# Patient Record
Sex: Female | Born: 1945 | Race: Black or African American | Hispanic: No | Marital: Single | Smoking: Former smoker
Health system: Southern US, Community
[De-identification: ages and names within clinical notes are randomized; demographics above are authoritative.]

## PROBLEM LIST (undated history)

## (undated) DIAGNOSIS — E559 Vitamin D deficiency, unspecified: Secondary | ICD-10-CM

## (undated) DIAGNOSIS — I10 Essential (primary) hypertension: Secondary | ICD-10-CM

## (undated) DIAGNOSIS — F329 Major depressive disorder, single episode, unspecified: Secondary | ICD-10-CM

## (undated) DIAGNOSIS — J449 Chronic obstructive pulmonary disease, unspecified: Secondary | ICD-10-CM

## (undated) DIAGNOSIS — R011 Cardiac murmur, unspecified: Secondary | ICD-10-CM

## (undated) DIAGNOSIS — R7303 Prediabetes: Secondary | ICD-10-CM

## (undated) DIAGNOSIS — R0602 Shortness of breath: Secondary | ICD-10-CM

## (undated) DIAGNOSIS — N182 Chronic kidney disease, stage 2 (mild): Secondary | ICD-10-CM

## (undated) DIAGNOSIS — T7840XA Allergy, unspecified, initial encounter: Secondary | ICD-10-CM

## (undated) DIAGNOSIS — E785 Hyperlipidemia, unspecified: Secondary | ICD-10-CM

## (undated) DIAGNOSIS — R42 Dizziness and giddiness: Secondary | ICD-10-CM

## (undated) HISTORY — DX: Hyperlipidemia, unspecified: E78.5

## (undated) HISTORY — PX: EYE SURGERY: SHX253

## (undated) HISTORY — DX: Dizziness and giddiness: R42

## (undated) HISTORY — DX: Cardiac murmur, unspecified: R01.1

## (undated) HISTORY — DX: Essential (primary) hypertension: I10

## (undated) HISTORY — DX: Major depressive disorder, single episode, unspecified: F32.9

## (undated) HISTORY — PX: TUBAL LIGATION: SHX77

## (undated) HISTORY — DX: Vitamin D deficiency, unspecified: E55.9

## (undated) HISTORY — DX: Chronic kidney disease, stage 2 (mild): N18.2

## (undated) HISTORY — PX: TONSILLECTOMY: SUR1361

## (undated) HISTORY — PX: FOOT SURGERY: SHX648

## (undated) HISTORY — DX: Allergy, unspecified, initial encounter: T78.40XA

## (undated) HISTORY — PX: COLONOSCOPY: SHX174

## (undated) HISTORY — DX: Shortness of breath: R06.02

---

## 1998-12-13 ENCOUNTER — Other Ambulatory Visit: Admission: RE | Admit: 1998-12-13 | Discharge: 1998-12-13 | Payer: Self-pay | Admitting: Family Medicine

## 2000-04-09 ENCOUNTER — Other Ambulatory Visit: Admission: RE | Admit: 2000-04-09 | Discharge: 2000-04-09 | Payer: Self-pay | Admitting: Family Medicine

## 2000-04-09 ENCOUNTER — Encounter: Admission: RE | Admit: 2000-04-09 | Discharge: 2000-04-09 | Payer: Self-pay | Admitting: Family Medicine

## 2000-04-09 ENCOUNTER — Encounter: Payer: Self-pay | Admitting: Family Medicine

## 2001-04-11 ENCOUNTER — Other Ambulatory Visit: Admission: RE | Admit: 2001-04-11 | Discharge: 2001-04-11 | Payer: Self-pay | Admitting: Family Medicine

## 2001-09-19 ENCOUNTER — Ambulatory Visit (HOSPITAL_COMMUNITY): Admission: RE | Admit: 2001-09-19 | Discharge: 2001-09-19 | Payer: Self-pay | Admitting: Gastroenterology

## 2001-09-19 ENCOUNTER — Encounter (INDEPENDENT_AMBULATORY_CARE_PROVIDER_SITE_OTHER): Payer: Self-pay | Admitting: *Deleted

## 2002-07-23 ENCOUNTER — Other Ambulatory Visit: Admission: RE | Admit: 2002-07-23 | Discharge: 2002-07-23 | Payer: Self-pay | Admitting: Family Medicine

## 2002-07-29 ENCOUNTER — Encounter: Admission: RE | Admit: 2002-07-29 | Discharge: 2002-07-29 | Payer: Self-pay | Admitting: Family Medicine

## 2002-07-29 ENCOUNTER — Encounter: Payer: Self-pay | Admitting: Family Medicine

## 2003-10-08 ENCOUNTER — Encounter: Admission: RE | Admit: 2003-10-08 | Discharge: 2003-10-08 | Payer: Self-pay | Admitting: Family Medicine

## 2004-11-29 ENCOUNTER — Other Ambulatory Visit: Admission: RE | Admit: 2004-11-29 | Discharge: 2004-11-29 | Payer: Self-pay | Admitting: Family Medicine

## 2004-12-27 ENCOUNTER — Other Ambulatory Visit: Admission: RE | Admit: 2004-12-27 | Discharge: 2004-12-27 | Payer: Self-pay | Admitting: Family Medicine

## 2005-01-10 ENCOUNTER — Encounter: Admission: RE | Admit: 2005-01-10 | Discharge: 2005-01-10 | Payer: Self-pay | Admitting: Family Medicine

## 2005-03-14 ENCOUNTER — Encounter: Admission: RE | Admit: 2005-03-14 | Discharge: 2005-03-14 | Payer: Self-pay | Admitting: *Deleted

## 2005-03-17 ENCOUNTER — Ambulatory Visit (HOSPITAL_COMMUNITY): Admission: RE | Admit: 2005-03-17 | Discharge: 2005-03-17 | Payer: Self-pay | Admitting: *Deleted

## 2005-03-17 ENCOUNTER — Encounter (INDEPENDENT_AMBULATORY_CARE_PROVIDER_SITE_OTHER): Payer: Self-pay | Admitting: Specialist

## 2005-03-17 ENCOUNTER — Ambulatory Visit (HOSPITAL_BASED_OUTPATIENT_CLINIC_OR_DEPARTMENT_OTHER): Admission: RE | Admit: 2005-03-17 | Discharge: 2005-03-17 | Payer: Self-pay | Admitting: *Deleted

## 2006-03-09 ENCOUNTER — Encounter: Admission: RE | Admit: 2006-03-09 | Discharge: 2006-03-09 | Payer: Self-pay | Admitting: Family Medicine

## 2006-05-01 ENCOUNTER — Ambulatory Visit (HOSPITAL_COMMUNITY): Admission: RE | Admit: 2006-05-01 | Discharge: 2006-05-01 | Payer: Self-pay | Admitting: Cardiovascular Disease

## 2006-07-14 IMAGING — CR DG KNEE 3 VIEWS*R*
3 series · 3 of 3 positions shown · non-contrast
Comparison: None.

RIGHT KNEE - 3 VIEW:

CLINICAL DATA: Knee pain

[t knee oblique right]
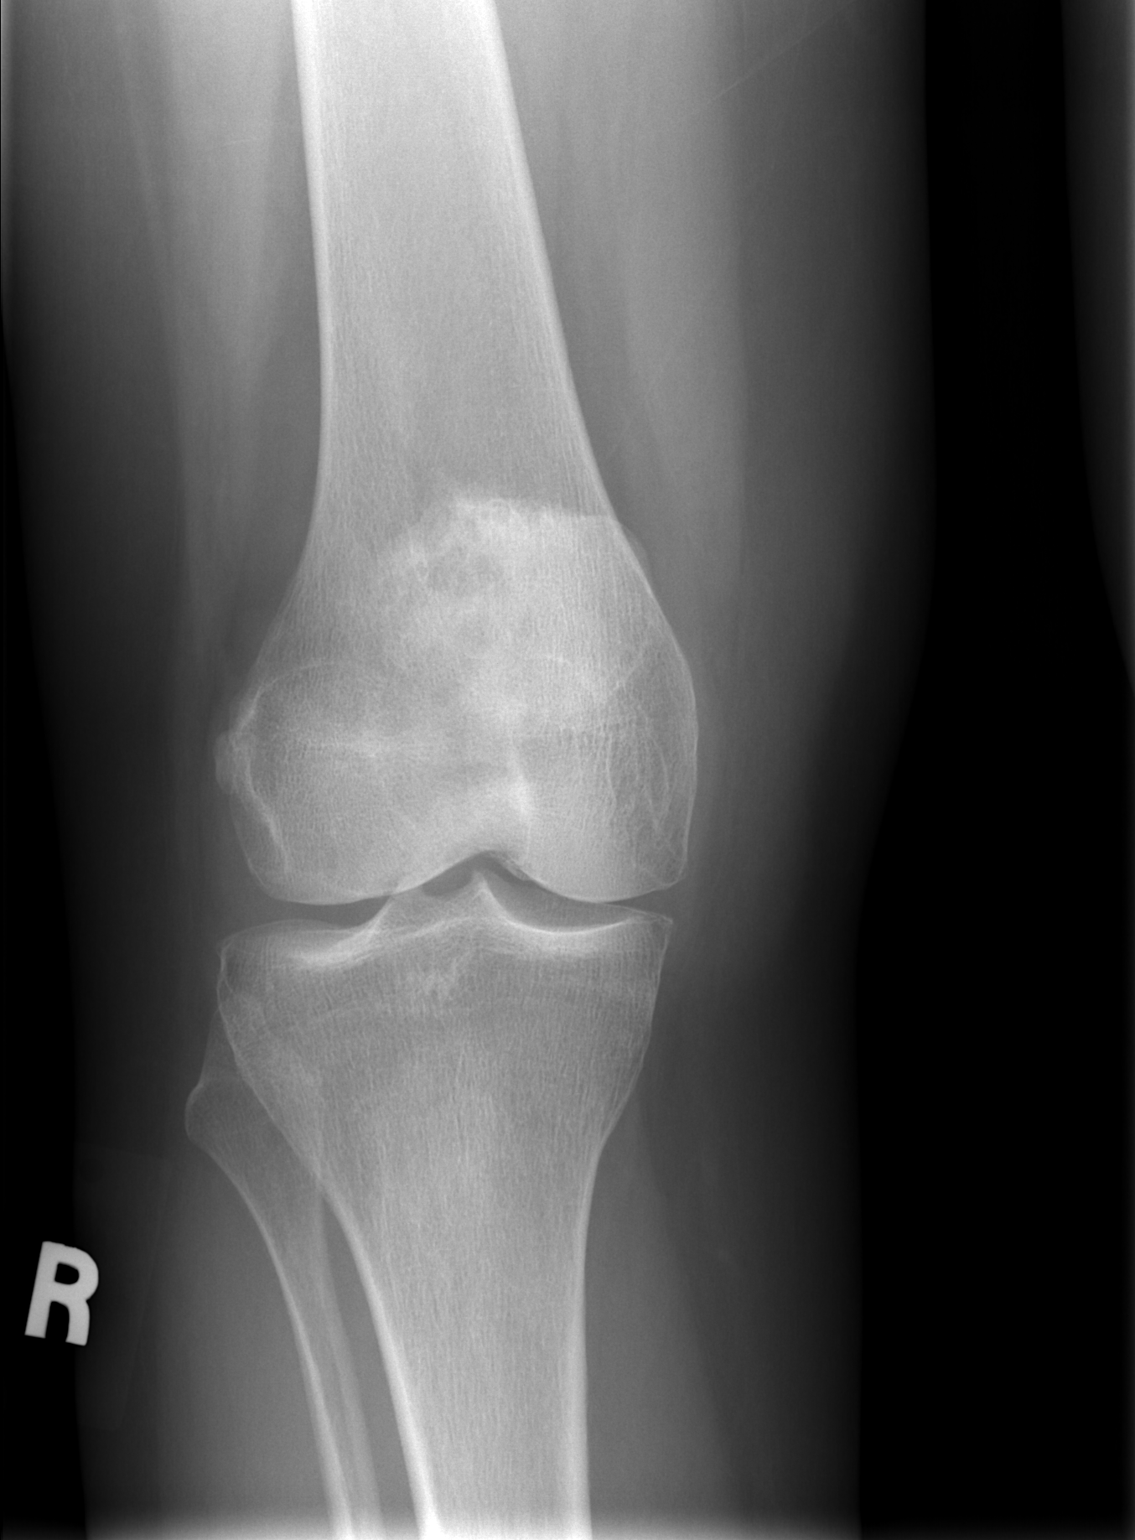

[t knee lat right]
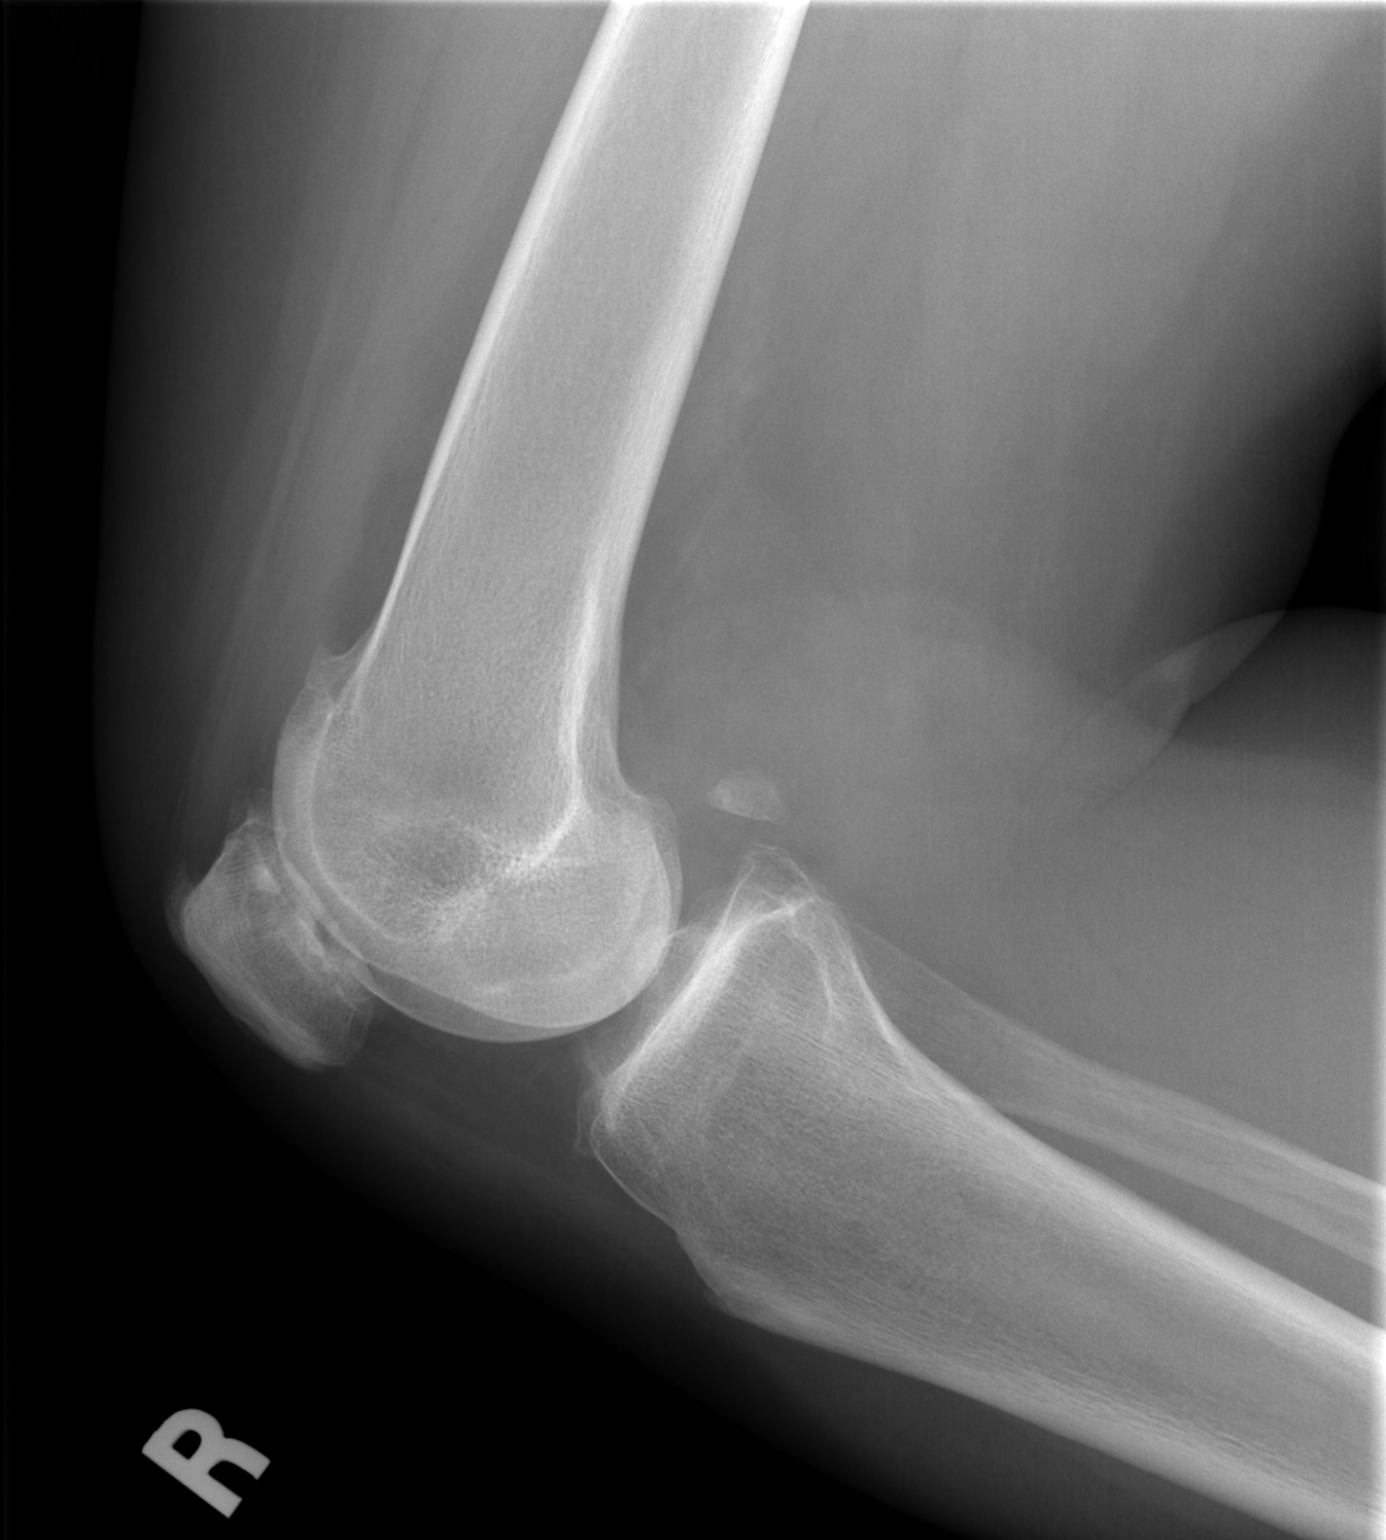

[view not recorded]
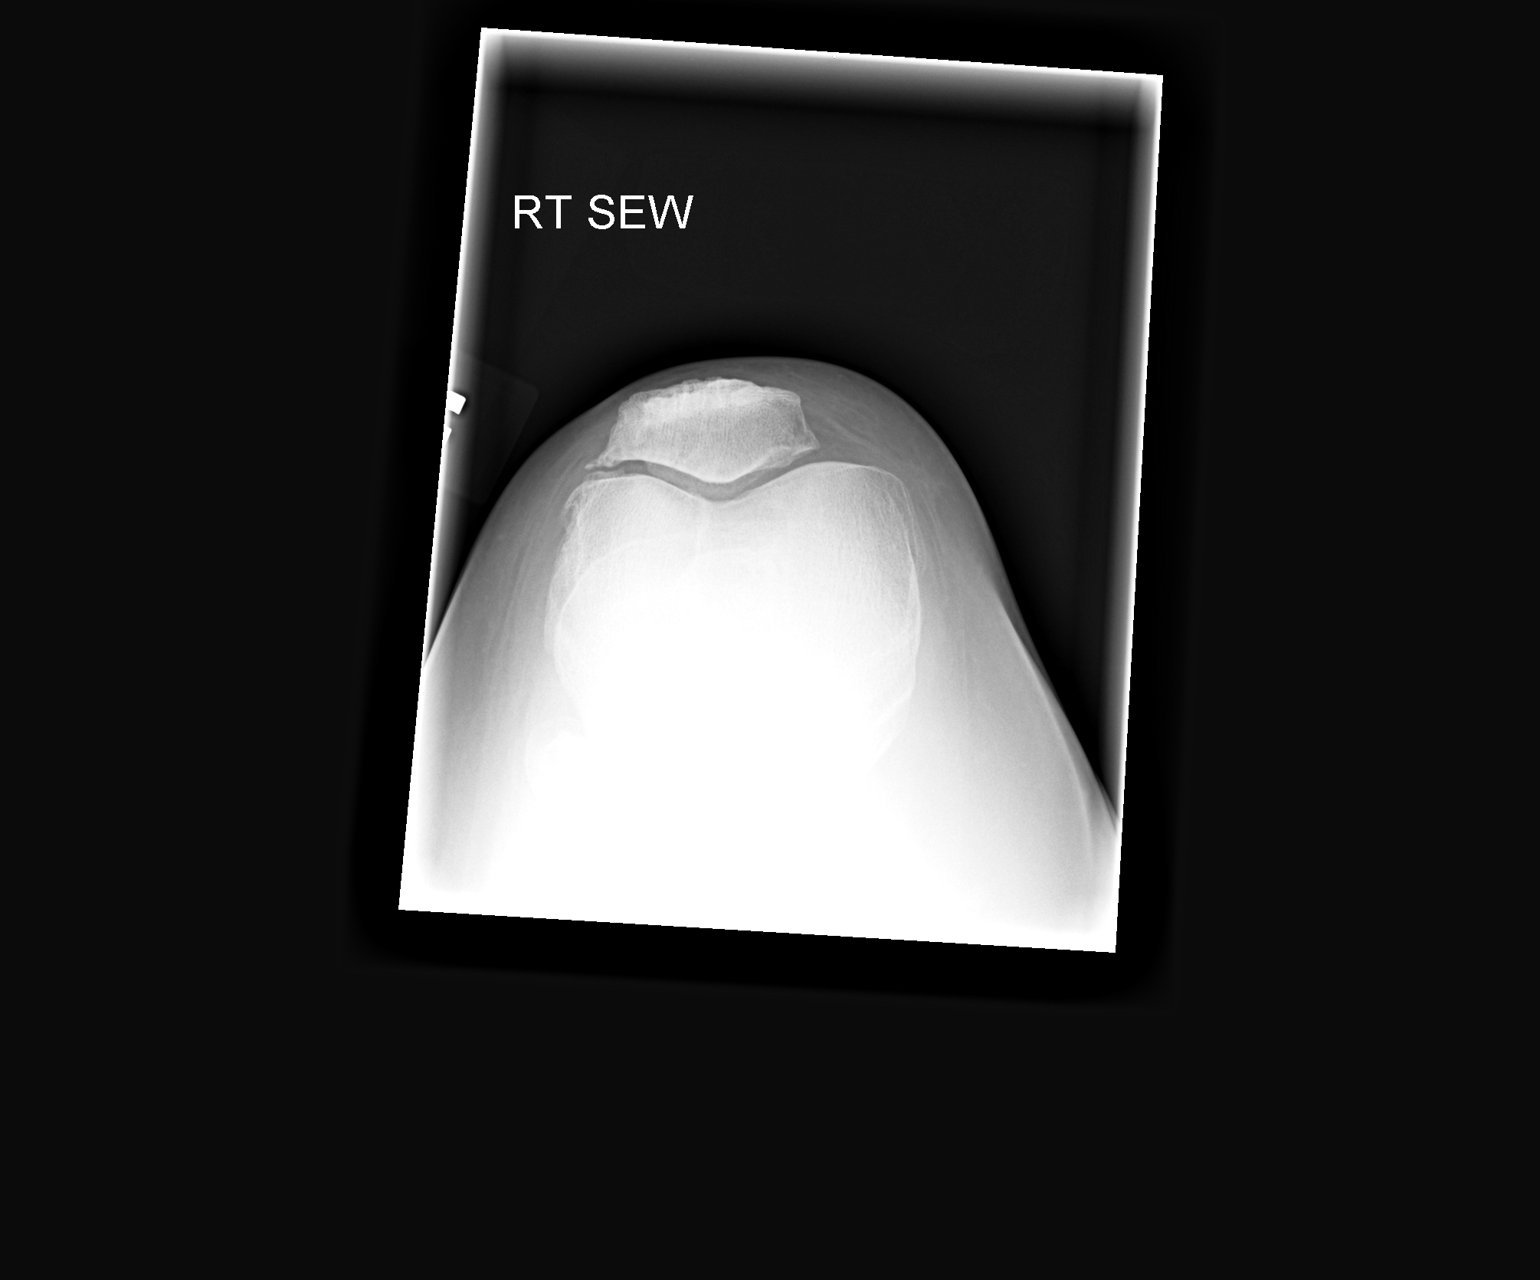

[3 of 3 positions shown; findings below may reference images not displayed]

FINDINGS: No evidence for acute fracture or dislocation. No joint effusion.
Tricompartmental spurring is apparent with the patellofemoral compartment being
most affected. Subchondral sclerosis and subchondral cyst formation noted along
the articular surface of the patella.
IMPRESSION: No acute bony abnormality. Degenerative changes are noted and are most prominent
in the patellofemoral compartment.

## 2006-11-16 ENCOUNTER — Ambulatory Visit (HOSPITAL_COMMUNITY): Admission: RE | Admit: 2006-11-16 | Discharge: 2006-11-16 | Payer: Self-pay | Admitting: Gastroenterology

## 2007-08-21 ENCOUNTER — Encounter: Admission: RE | Admit: 2007-08-21 | Discharge: 2007-08-21 | Payer: Self-pay | Admitting: Family Medicine

## 2007-08-23 ENCOUNTER — Encounter: Admission: RE | Admit: 2007-08-23 | Discharge: 2007-08-23 | Payer: Self-pay | Admitting: Family Medicine

## 2009-08-05 ENCOUNTER — Encounter: Admission: RE | Admit: 2009-08-05 | Discharge: 2009-08-05 | Payer: Self-pay | Admitting: Family Medicine

## 2010-07-06 ENCOUNTER — Encounter: Admission: RE | Admit: 2010-07-06 | Discharge: 2010-07-06 | Payer: Self-pay | Admitting: Family Medicine

## 2011-01-20 NOTE — Op Note (Signed)
NAMECLARISA, Katherine Summers                ACCOUNT NO.:  [id number]   MEDICAL RECORD NO.:  [id number]          PATIENT TYPE:  AMB   LOCATION:  ENDO                         FACILITY:  [facility]   PHYSICIAN:  Anselmo Rod, M.D.  DATE OF BIRTH:  May 05, 1946   DATE OF PROCEDURE:  11/16/2006  DATE OF DISCHARGE:                               OPERATIVE REPORT   PROCEDURE PERFORMED:  Screening colonoscopy.   ENDOSCOPIST:  Anselmo Rod, M.D.   INSTRUMENT USED:  The Pentax video colonoscope.   INDICATIONS:  A 65 year old [place] American female with a family  history of colon cancer in paternal grandmother and her father,  undergoing screening colonoscopy.  This is her 5-year surveillance  colonoscopy.  Rule out colonic polyps, masses, etc.   PREPROCEDURE PREPARATION:  Informed consent was procured from the  patient. The patient fasted for 4 hours prior to the procedure and  prepped with 20 of OsmoPrep the night of and 12  of OsmoPrep in the  morning of the procedure.  Risks and benefits of the procedure including  a 10% misread of cancer and polyp were discussed with the patient as  well.   PREPROCEDURE PHYSICAL EXAMINATION:  VITAL SIGNS:  Stable.  NECK:  Supple.  CHEST:  Clear to auscultation.  HEART:  S1 and S2 regular.  ABDOMEN:  Soft with normal bowel sounds.   DESCRIPTION OF PROCEDURE:  The patient was placed in the left lateral  decubitus position and sedated with 95 mcg of Fentanyl and 9.5 g of  Versed given intravenously in slow incremental doses.  Once the patient  was adequately sedated and maintained on low-flow oxygen and continuous  cardiac monitoring, the Pentax video colonoscope was advanced from the  rectum to the cecum except for a few sigmoid diverticula.  No other  abnormalities were noted.  The appendiceal orifice and cecal valve were  clearly visualized and photographed.  The terminal ileum appeared  healthy without lesions.  No masses or polyps were noted.   Retroflexion  in the rectum revealed no abnormalities.   IMPRESSION:  1. Normal colonoscopy to terminal ileum except first a few sigmoid      diverticula.  2. No masses or polyps seen.   RECOMMENDATIONS:  1. Continue high fiber diet with liberal fluid intake.  Brochures on      diverticulosis were given to the patient for      education.  2. Repeat colonoscopy in the next 5 years.  If the patient has any      abnormal symptoms in the interim, she is to contact the office      immediately for further recommendations.      Anselmo Rod, M.D.  Electronically Signed     JNM/MEDQ  D:  11/16/2006  T:  11/17/2006  Job:  [id number]   cc:   Talmadge Coventry, M.D.

## 2011-01-20 NOTE — Procedures (Signed)
. [facility]  Patient:    Katherine Summers, Katherine Summers Visit Number: [id number] MRN: [id number]          Service Type: END Location: ENDO Attending Physician:  Charna Elizabeth Dictated by:   Anselmo Rod, M.D. Proc. Date: 09/19/01 Admit Date:  09/19/2001 Discharge Date: 09/19/2001   CC:         Talmadge Coventry, M.D.                           Procedure Report  DATE OF BIRTH:  10/23/1945.  PROCEDURE:  Colonoscopy with snare polypectomy x 1.  ENDOSCOPIST:  Anselmo Rod, M.D.  INSTRUMENT USED:  Olympus video colonoscope.  INDICATION FOR PROCEDURE:  Family history of colon cancer in a 65 year old African-American female.  Rule out colonic polyps.  PREPROCEDURE PREPARATION:  Informed consent was procured from the patient. The patient was fasted for eight hours prior to the procedure and prepped with a bottle of magnesium citrate and a gallon of NuLytely the night prior to the procedure.  PREPROCEDURE PHYSICAL:  VITAL SIGNS:  The patient had stable vital signs.  NECK:  Supple.  CHEST:  Clear to auscultation.  S1, S2 regular.  ABDOMEN:  Soft with normal bowel sounds.  DESCRIPTION OF PROCEDURE:  The patient was placed in the left lateral decubitus position and sedated with 50 mg of Demerol and 5 mg of Versed. intravenously.  Once the patient was adequately sedate and maintained on low-flow oxygen and continuous cardiac monitoring, the Olympus video colonoscope was advanced from the rectum to the cecum without difficulty.  The patient had a small sessile polyp that was snared from 30 cm.  There was also evidence of early left-sided diverticulosis.  On retroflexion, a small internal hemorrhoid was appreciated.  The procedure was complete up to the cecum.  The ileocecal valve and appendiceal orifice were clearly visualized and photographed.  IMPRESSION: 1. Small, nonbleeding internal hemorrhoid. 2. Early left-sided diverticulosis. 3. Small  sessile polyp snared from 30 cm.  RECOMMENDATIONS: 1. Await pathology results. 2. Repeat colorectal cancer screening depending on the pathology results. 3. Avoid all nonsteroidals including aspirin for the next three weeks. 4. A high-fiber diet. 5. Outpatient follow-up in the next four weeks. Dictated by:   Anselmo Rod, M.D. Attending Physician:  Charna Elizabeth DD:  09/19/01 TD:  09/19/01 Job: [id number]

## 2011-01-20 NOTE — Op Note (Signed)
NAMEGENAE, STRINE                ACCOUNT NO.:  [id number]   MEDICAL RECORD NO.:  [id number]          PATIENT TYPE:  AMB   LOCATION:  NESC                         FACILITY:  [facility]   PHYSICIAN:  Pershing Cox, M.D.DATE OF BIRTH:  07/12/1946   DATE OF PROCEDURE:  03/17/2005  DATE OF DISCHARGE:                                 OPERATIVE REPORT   PREOPERATIVE DIAGNOSES:  Endometrial cells on Pap smear and abnormal  endometrium on sonogram.   POSTOPERATIVE DIAGNOSES:  Normal endometrial cavity. No filling defects,  history of endometrial cells on Pap smear.   PROCEDURE:  D&C hysteroscopy.   SURGEON:  Pershing Cox, M.D.   ASSISTANT:  None.   ANESTHESIA:  MAC plus Marcaine paracervical block.   INDICATIONS FOR PROCEDURE:  Anakaren Campion is 65 years old. She presented to  my office with a history of workup for right lower quadrant pain and a  history of Pap smear with endometrial cells on March 2006. The patient had a  sonogram in May which showed endometrial fluid and an irregular endometrial  lining. The ovaries could not be visualized on the scan and there was fluid  in the cul-de-sac. In preparation for this surgery, we repeated her  sonogram. Fluid in the endometrial canal persists. There is no cul-de-sac  fluid and the ovaries were visualized and very small and normal in size and  shape. She is brought to the operating room today to evaluate her  endometrial lining.   FINDINGS:  Exam under anesthesia shows a normal uterus, no evidence of  adnexal enlargement. The endometrial canal sounded to a depth of 8 cm. There  was copious cervical mucous. There are no filling defects in the endometrial  lining. There was a moderate amount of tissue obtained on curettage.   DESCRIPTION OF PROCEDURE:  The patient was brought to the operating room  with an IV in place. She received a gram of Ancef in the holding area. She  was taken to the operating room and supine on the OR table  IV sedation was  administered. When she was completely sedated, she was placed into Allen  stirrups. Exam under anesthesia was performed. The patient then was prepped  with Betadine and her bladder was emptied with a sterile catheter. Sterile  linens were applied.   A Sims retractor was used to visualize the cervix and a narrow Deaver lifted  the bladder. Marcaine was instilled into the anterior cervix which was  grasped with a single-tooth tenaculum and Marcaine paracervical block was  administered at the 3, 4, 7, and 8 positions using a total volume of 15 mL.  Telfa was placed beneath the cervix and Kevorkian curette was used to obtain  endocervical curetting's. The sound then passed to a depth of 8 cm. Serial  Pratt dilators were used to dilate the cervix to size 25 and the diagnostic  hysteroscope was introduced into the endometrial canal. Using through-and-  through sorbitol irrigation, the cavity was visualized and photographs were  taken. The scope was removed and a clean Telfa was placed beneath the  cervix.  Using a small sharp curette, the walls of the endometrium was  serially curetted in all quadrants and then diffusely curetted with this  small sharp curette. The Meigs curette was then used to finish the curettage  but no additional tissue was obtained. I looked again with the hysteroscope  and there was no tissue remaining to be noted. Photographs documented this  curettage.   The patient tolerated the procedure well and was taken to the recovery room  in good condition. She will be discharged home from this procedure.       MAJ/MEDQ  D:  03/17/2005  T:  03/17/2005  Job:  [id number]   cc:   Talmadge Coventry, M.D.  [address]  [place]  [place] [address]  Fax: [phone]

## 2014-07-01 ENCOUNTER — Ambulatory Visit (INDEPENDENT_AMBULATORY_CARE_PROVIDER_SITE_OTHER): Payer: 59 | Admitting: Psychology

## 2014-07-01 DIAGNOSIS — F332 Major depressive disorder, recurrent severe without psychotic features: Secondary | ICD-10-CM

## 2014-07-17 ENCOUNTER — Ambulatory Visit: Payer: 59 | Admitting: Psychology

## 2014-08-21 ENCOUNTER — Ambulatory Visit (INDEPENDENT_AMBULATORY_CARE_PROVIDER_SITE_OTHER): Payer: 59 | Admitting: Psychology

## 2014-08-21 DIAGNOSIS — F332 Major depressive disorder, recurrent severe without psychotic features: Secondary | ICD-10-CM

## 2014-09-18 ENCOUNTER — Ambulatory Visit: Payer: 59 | Admitting: Psychology

## 2014-12-02 ENCOUNTER — Encounter: Payer: Self-pay | Admitting: Podiatry

## 2014-12-02 ENCOUNTER — Ambulatory Visit (INDEPENDENT_AMBULATORY_CARE_PROVIDER_SITE_OTHER): Payer: Medicare Other | Admitting: Podiatry

## 2014-12-02 ENCOUNTER — Ambulatory Visit (INDEPENDENT_AMBULATORY_CARE_PROVIDER_SITE_OTHER): Payer: Medicare Other

## 2014-12-02 VITALS — BP 121/73 | HR 80 | Resp 16 | Ht 65.5 in | Wt 170.0 lb

## 2014-12-02 DIAGNOSIS — M2010 Hallux valgus (acquired), unspecified foot: Secondary | ICD-10-CM | POA: Diagnosis not present

## 2014-12-02 DIAGNOSIS — M2040 Other hammer toe(s) (acquired), unspecified foot: Secondary | ICD-10-CM

## 2014-12-02 DIAGNOSIS — B351 Tinea unguium: Secondary | ICD-10-CM | POA: Diagnosis not present

## 2014-12-02 DIAGNOSIS — M79673 Pain in unspecified foot: Secondary | ICD-10-CM

## 2014-12-02 NOTE — Progress Notes (Signed)
Subjective:     Patient ID: Katherine Summers, female   DOB: [birth year], 69 y.o.   MRN: [id number]  HPI patient has structural deformity of both feet that she was concerned about and severe thickness of her nailbeds 1-5 both feet that she cannot cut herself and she has tried to take care of but do to long-term diabetes is concerned about doing this   Review of Systems  All other systems reviewed and are negative.      Objective:   Physical Exam  Constitutional: She is oriented to person, place, and time.  Cardiovascular: Intact distal pulses.   Musculoskeletal: Normal range of motion.  Neurological: She is oriented to person, place, and time.  Skin: Skin is warm.  Nursing note and vitals reviewed.  neurovascular status intact with muscle strength adequate and range of motion moderately diminished subtalar midtarsal joint. Severe foot deformity of both feet with previous correction that did well but there is some reduction of motion of the digital function secondary to the procedures. 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 At this time I recommended nail debridement which was accomplished with no iatrogenic bleeding and this will be done routinely. Digits are functioning and I do not recommend any further surgery for this or structural bunion deformity which I discussed with patient

## 2014-12-02 NOTE — Progress Notes (Signed)
   Subjective:    Patient ID: Katherine Summers, female    DOB: [birth year], 69 y.o.   MRN: [id number]  HPI i am pre diabetic so i need to have my toenails trimmed, also have a lot of problems with both my feet since he did my  Surgery and my toes dont open, my legs lock up and my knees are bad, and its all from the surgery   Review of Systems  Endocrine:       Diabetes   All other systems reviewed and are negative.      Objective:   Physical Exam        Assessment & Plan:

## 2015-03-22 ENCOUNTER — Ambulatory Visit: Payer: Medicare Other

## 2016-07-20 ENCOUNTER — Other Ambulatory Visit: Payer: Self-pay

## 2016-07-20 NOTE — Telephone Encounter (Signed)
FAXED NOTES TO [facility] 07/20/16 RJ

## 2016-07-26 ENCOUNTER — Telehealth: Payer: Self-pay | Admitting: Cardiovascular Disease

## 2016-07-26 NOTE — Telephone Encounter (Signed)
Received records from [facility] for appointment on 08/01/16 with Dr [name].  [facility] for Dr Leonides Sakeandolph's schedule on 08/01/16. lp

## 2016-08-01 ENCOUNTER — Encounter: Payer: Self-pay | Admitting: Cardiovascular Disease

## 2016-08-01 ENCOUNTER — Ambulatory Visit (INDEPENDENT_AMBULATORY_CARE_PROVIDER_SITE_OTHER): Payer: Medicare Other | Admitting: Cardiovascular Disease

## 2016-08-01 VITALS — BP 130/78 | HR 98 | Ht 65.0 in | Wt 183.6 lb

## 2016-08-01 DIAGNOSIS — R9431 Abnormal electrocardiogram [ECG] [EKG]: Secondary | ICD-10-CM | POA: Diagnosis not present

## 2016-08-01 DIAGNOSIS — F329 Major depressive disorder, single episode, unspecified: Secondary | ICD-10-CM | POA: Insufficient documentation

## 2016-08-01 DIAGNOSIS — R0602 Shortness of breath: Secondary | ICD-10-CM | POA: Diagnosis not present

## 2016-08-01 DIAGNOSIS — I10 Essential (primary) hypertension: Secondary | ICD-10-CM | POA: Diagnosis not present

## 2016-08-01 DIAGNOSIS — F32A Depression, unspecified: Secondary | ICD-10-CM | POA: Insufficient documentation

## 2016-08-01 DIAGNOSIS — R011 Cardiac murmur, unspecified: Secondary | ICD-10-CM | POA: Diagnosis not present

## 2016-08-01 DIAGNOSIS — F3289 Other specified depressive episodes: Secondary | ICD-10-CM

## 2016-08-01 HISTORY — DX: Essential (primary) hypertension: I10

## 2016-08-01 HISTORY — DX: Depression, unspecified: F32.A

## 2016-08-01 HISTORY — DX: Cardiac murmur, unspecified: R01.1

## 2016-08-01 HISTORY — DX: Shortness of breath: R06.02

## 2016-08-01 NOTE — Patient Instructions (Addendum)
Medication Instructions:  Your physician recommends that you continue on your current medications as directed. Please refer to the Current Medication list given to you today.  Labwork: NONE  Testing/Procedures: Your physician has requested that you have an echocardiogram. Echocardiography is a painless test that uses sound waves to create images of your heart. It provides your doctor with information about the size and shape of your heart and how well your heart's chambers and valves are working. This procedure takes approximately one hour. There are no restrictions for this procedure. [facility] [address]  Your physician has requested that you have a lexiscan myoview. For further information please visit [email or web address]. Please follow instruction sheet, as given.  Follow-Up: Your physician recommends that you schedule a follow-up appointment in: 1 MONTH OV ON 08/29/16  If you need a refill on your cardiac medications before your next appointment, please call your pharmacy.

## 2016-08-01 NOTE — Progress Notes (Signed)
Cardiology Office Note   Date:  08/01/2016   ID:  Katherine Summers, [place] MeoDOB 08-21-1946, MRN [id number]  PCP:  Gwynneth AlimentSANDERS,ROBYN N, MD  Cardiologist:   Chilton Siiffany [place], MD   Chief Complaint  Patient presents with  . New Patient (Initial Visit)     History of Present Illness: Katherine Summers is a 70 y.o. female with diabetes, hyperlipidemia, and hypertension who presents for evaluation of an abnormal EKG.  Katherine Summers saw Dr. Dorothyann Pengobyn Sanders on 07/12/16 and was noted to have an abnormal EKG. She had anterolateral ST depressions with T-wave inversions. She was asymptomatic at the time but was referred to cardiology for further evaluation.  She notes that she has been feeling well physically but has been feeling sad and depressed.  She feels stressed by her family.  She has adult children but doesn't see them regularly.  She has mostly been laying around her house.  She attempts to exercise by doing a lap around her house.  However, she does note shortness of breath with minimal exertion.  She denies chest pain, nausea or diaphoresis.  She occasionally has lower extremity edema but it improves with elevation of her legs.  She denies orthopnea or PND.    Katherine Summers due to both shortness of breath as well as because of bilateral leg pain. She was told that she needs bilateral hips and knees replaced.  However, she doesn't want to have surgery. She reports constant leg pain and they sometimes give out on her. She takes Spiriva most days but doesn't think that it helps her breathing.  She quit smoking 15-20 years ago.   Past Medical History:  Diagnosis Date  . Allergy   . Depression 08/01/2016  . Diabetes mellitus without complication (HCC)   . Essential hypertension 08/01/2016  . Hypertension   . Murmur 08/01/2016  . Shortness of breath 08/01/2016    Past Surgical History:  Procedure Laterality Date  . FOOT SURGERY       Current Outpatient Prescriptions  Medication Sig  Dispense Refill  . amLODipine (NORVASC) 5 MG tablet Take 5 mg by mouth daily.  5  . aspirin 81 MG tablet Take 81 mg by mouth daily.    Marland Kitchen. atorvastatin (LIPITOR) 20 MG tablet   3  . Cholecalciferol (D3-1000 PO) Take 2 capsules by mouth daily.    Marland Kitchen. escitalopram (LEXAPRO) 5 MG tablet Take 5 mg by mouth daily.  5  . lisinopril-hydrochlorothiazide (PRINZIDE,ZESTORETIC) 20-12.5 MG per tablet Take 1 tablet by mouth daily.  5  . loratadine (CLARITIN) 10 MG tablet Take 1 tablet by mouth daily.  0  . Tiotropium Bromide Monohydrate (SPIRIVA RESPIMAT) 1.25 MCG/ACT AERS Inhale 1 puff into the lungs daily.     No current facility-administered medications for this visit.     Allergies:   Patient has no known allergies.    Social History:  The patient  reports that she has quit smoking. She does not have any smokeless tobacco history on file.   Family History:  The patient's family history includes Colon cancer in her father, paternal grandfather, and sister; Stroke in her maternal grandmother and mother.    ROS:  Please see the history of present illness.   Otherwise, review of systems are positive for none.   All other systems are reviewed and negative.    PHYSICAL EXAM: VS:  BP 130/78 (BP Location: Right Arm)   Pulse 98   Ht 5\' 5"  (  1.651 m)   Wt 83.3 kg (183 lb 9.6 oz)   BMI 30.55 kg/m  , BMI Body mass index is 30.55 kg/m. GENERAL:  Well appearing HEENT:  Pupils equal round and reactive, fundi not visualized, oral mucosa unremarkable NECK:  No jugular venous distention, waveform within normal limits, carotid upstroke brisk and symmetric, no bruits, no thyromegaly LYMPHATICS:  No cervical adenopathy LUNGS:  Clear to auscultation bilaterally HEART:  RRR.  PMI not displaced or sustained,S1 and S2 within normal limits, no S3, no S4, no clicks, no rubs, II/VI systolic murmur at the LUSB ABD:  Flat, positive bowel sounds normal in frequency in pitch, no bruits, no rebound, no guarding, no midline  pulsatile mass, no hepatomegaly, no splenomegaly EXT:  2 plus pulses throughout, no edema, no cyanosis no clubbing SKIN:  No rashes no nodules NEURO:  Cranial nerves II through XII grossly intact, motor grossly intact throughout PSYCH:  Cognitively intact, oriented to person place and time    EKG:  EKG is ordered today. The ekg ordered today demonstrates sinus rhythm rate 98 bpm.  Inferolateral ST depression. 07/12/16: Sinus rhythm. Rate 86 bpm. Anterolateral T-wave inversions with 1 mm ST depression.  Recent Labs: No results found for requested labs within last 8760 hours.   Thousand 17: Sodium 143, potassium 3.9, BUN 13, creatinine 0.82 AST 36, ALT 42  Lipid Panel No results found for: CHOL, TRIG, HDL, CHOLHDL, VLDL, LDLCALC, LDLDIRECT    Wt Readings from Last 3 Encounters:  08/01/16 83.3 kg (183 lb 9.6 oz)  12/02/14 77.1 kg (170 lb)      ASSESSMENT AND PLAN:  # Abnormal EKG: # Shortness of breath: # Murmur:  Katherine Summers EKG is concerning for ischemia. She has exertional dyspnea but no chest pain.  However, she isn't very physically active and reports that she mostly lays in the bed all day.  She doesn't think that she would be able to walk on a treadmill.  We will get a Lexiscan Myoview to evaluate for ischemia.  She also has a murmur on exam that could be mild aortic stenosis.  We will get an echo to assess.  # Hypertension: BP well-controlled.  Continue amlodipine, lisinopril and HCTZ.   Current medicines are reviewed at length with the patient today.  The patient does not have concerns regarding medicines.  The following changes have been made:  no change  Labs/ tests ordered today include:   Orders Placed This Encounter  Procedures  . Myocardial Perfusion Imaging  . EKG 12-Lead  . ECHOCARDIOGRAM COMPLETE     Disposition:   FU with [name], MD, [facility] in 1 month    This note was written with the assistance of speech recognition software.  Please  excuse any transcriptional errors.  Signed, Anaiya Wisinski C. [name], MD, [facility]ACC  08/01/2016 4:21 PM    [facility]

## 2016-08-09 ENCOUNTER — Telehealth (HOSPITAL_COMMUNITY): Payer: Self-pay

## 2016-08-09 NOTE — Telephone Encounter (Signed)
Encounter complete. 

## 2016-08-11 ENCOUNTER — Ambulatory Visit (HOSPITAL_COMMUNITY)
Admission: RE | Admit: 2016-08-11 | Discharge: 2016-08-11 | Disposition: A | Discharge status: Home / Self Care | Payer: Medicare Other | Source: Ambulatory Visit | Attending: Cardiology | Admitting: Cardiology

## 2016-08-11 ENCOUNTER — Encounter (HOSPITAL_COMMUNITY): Payer: Self-pay

## 2016-08-11 DIAGNOSIS — R0602 Shortness of breath: Secondary | ICD-10-CM | POA: Diagnosis not present

## 2016-08-11 DIAGNOSIS — R9431 Abnormal electrocardiogram [ECG] [EKG]: Secondary | ICD-10-CM | POA: Diagnosis not present

## 2016-08-11 LAB — MYOCARDIAL PERFUSION IMAGING
CHL CUP NUCLEAR SSS: 1
CSEPPHR: 100 {beats}/min
LVDIAVOL: 55 mL (ref 46–106)
LVSYSVOL: 17 mL
Rest HR: 65 {beats}/min
SDS: 1
SRS: 0
TID: 1.01

## 2016-08-11 MED ORDER — AMINOPHYLLINE 25 MG/ML IV SOLN
75.0000 mg | Freq: Once | INTRAVENOUS | Status: AC
  Administered 2016-08-11: 75 mg via INTRAVENOUS

## 2016-08-11 MED ORDER — TECHNETIUM TC 99M TETROFOSMIN IV KIT
29.7000 | PACK | Freq: Once | INTRAVENOUS | Status: AC | PRN
  Administered 2016-08-11: 29.7 via INTRAVENOUS
  Filled 2016-08-11: qty 30

## 2016-08-11 MED ORDER — TECHNETIUM TC 99M TETROFOSMIN IV KIT
10.4000 | PACK | Freq: Once | INTRAVENOUS | Status: AC | PRN
  Administered 2016-08-11: 10.4 via INTRAVENOUS
  Filled 2016-08-11: qty 11

## 2016-08-11 MED ORDER — REGADENOSON 0.4 MG/5ML IV SOLN
0.4000 mg | Freq: Once | INTRAVENOUS | Status: AC
  Administered 2016-08-11: 0.4 mg via INTRAVENOUS

## 2016-08-18 ENCOUNTER — Telehealth: Payer: Self-pay | Admitting: Cardiovascular Disease

## 2016-08-18 NOTE — Telephone Encounter (Signed)
Patient called with stress test results. Voiced understanding.

## 2016-08-18 NOTE — Telephone Encounter (Signed)
New message  Pt is returning call  Possibly calling about results  Please call back

## 2016-08-23 ENCOUNTER — Ambulatory Visit (HOSPITAL_COMMUNITY): Payer: Medicare Other | Attending: Internal Medicine

## 2016-08-23 ENCOUNTER — Other Ambulatory Visit: Payer: Self-pay

## 2016-08-23 DIAGNOSIS — I119 Hypertensive heart disease without heart failure: Secondary | ICD-10-CM | POA: Diagnosis not present

## 2016-08-23 DIAGNOSIS — E785 Hyperlipidemia, unspecified: Secondary | ICD-10-CM | POA: Diagnosis not present

## 2016-08-23 DIAGNOSIS — I361 Nonrheumatic tricuspid (valve) insufficiency: Secondary | ICD-10-CM | POA: Insufficient documentation

## 2016-08-23 DIAGNOSIS — R0600 Dyspnea, unspecified: Secondary | ICD-10-CM | POA: Diagnosis present

## 2016-08-23 DIAGNOSIS — E119 Type 2 diabetes mellitus without complications: Secondary | ICD-10-CM | POA: Insufficient documentation

## 2016-08-23 DIAGNOSIS — R9431 Abnormal electrocardiogram [ECG] [EKG]: Secondary | ICD-10-CM

## 2016-08-23 DIAGNOSIS — R0602 Shortness of breath: Secondary | ICD-10-CM

## 2016-08-29 ENCOUNTER — Encounter: Payer: Self-pay | Admitting: Cardiovascular Disease

## 2016-08-29 ENCOUNTER — Ambulatory Visit (INDEPENDENT_AMBULATORY_CARE_PROVIDER_SITE_OTHER): Payer: Medicare Other | Admitting: Cardiovascular Disease

## 2016-08-29 VITALS — BP 138/72 | HR 66 | Resp 18 | Ht 65.0 in | Wt 184.6 lb

## 2016-08-29 DIAGNOSIS — R0602 Shortness of breath: Secondary | ICD-10-CM | POA: Diagnosis not present

## 2016-08-29 DIAGNOSIS — I10 Essential (primary) hypertension: Secondary | ICD-10-CM | POA: Diagnosis not present

## 2016-08-29 DIAGNOSIS — E7800 Pure hypercholesterolemia, unspecified: Secondary | ICD-10-CM

## 2016-08-29 NOTE — Progress Notes (Signed)
Cardiology Office Note   Date:  08/29/2016   ID:  Katherine CoriaBeverly M Summers, Katherine MeoDOB Mar 29, 1946, MRN [id number]  PCP:  Katherine AlimentSANDERS,ROBYN N, MD  Cardiologist:   Katherine Siiffany Loyola, MD   No chief complaint on file.    History of Present Illness: Katherine Summers is a 70 y.o. female with diabetes, hyperlipidemia, and hypertension who presents for follow up.  Ms. Katherine Summers saw Dr. Dorothyann Pengobyn Summers on 07/12/16 and was noted to have an abnormal EKG. She had anterolateral ST depressions with T-wave inversions. She was asymptomatic at the time but was referred to cardiology for further evaluation.  She also reported shortness of breath with exertion and has not been very physically active at the time. She has bilateral leg pain and needs both hips and knees replaced. She was referred for Katherine Summers that was negative for ischemia. Echocardiogram 08/23/16 revealed LVEF 65-70% with moderate LVH and diastolic dysfunction.  Since her last appointment Ms. Katherine Summers has been well.  She continues to note exertional dyspnea.  She has not been very active.  She sleeps in until the afternoon and doesn't feel like she has any purpose.  She doesn't see her adult children and is no longer active in church.  She sees a therapist but doesn't think it is helping.  She denies chest pain, lower extremity edema, orthopnea or PND.  She continues to complain of hip and leg pain and is upset that she has to walk with a cane.     Past Medical History:  Diagnosis Date  . Allergy   . Depression 08/01/2016  . Diabetes mellitus without complication (HCC)   . Essential hypertension 08/01/2016  . Hypertension   . Murmur 08/01/2016  . Shortness of breath 08/01/2016    Past Surgical History:  Procedure Laterality Date  . FOOT SURGERY       Current Outpatient Prescriptions  Medication Sig Dispense Refill  . amLODipine (NORVASC) 5 MG tablet Take 5 mg by mouth daily.  5  . aspirin 81 MG tablet Take 81 mg by mouth daily.    Marland Kitchen. atorvastatin  (LIPITOR) 20 MG tablet   3  . Cholecalciferol (D3-1000 PO) Take 2 capsules by mouth daily.    Marland Kitchen. escitalopram (LEXAPRO) 5 MG tablet Take 5 mg by mouth daily.  5  . lisinopril-hydrochlorothiazide (PRINZIDE,ZESTORETIC) 20-12.5 MG per tablet Take 1 tablet by mouth daily.  5  . loratadine (CLARITIN) 10 MG tablet Take 1 tablet by mouth daily.  0  . Tiotropium Bromide Monohydrate (SPIRIVA RESPIMAT) 1.25 MCG/ACT AERS Inhale 1 puff into the lungs daily.     No current facility-administered medications for this visit.     Allergies:   Patient has no known allergies.    Social History:  The patient  reports that she has quit smoking. She does not have any smokeless tobacco history on file.   Family History:  The patient's family history includes Colon cancer in her father, paternal grandfather, and sister; Stroke in her maternal grandmother and mother.    ROS:  Please see the history of present illness.   Otherwise, review of systems are positive for none.   All other systems are reviewed and negative.    PHYSICAL EXAM: VS:  BP 138/72   Pulse 66   Resp 18   Ht 5\' 5"  (1.651 m)   Wt 83.7 kg (184 lb 9.6 oz)   SpO2 99%   BMI 30.72 kg/m  , BMI Body mass index is 30.72 kg/m.  GENERAL:  Well appearing HEENT:  Pupils equal round and reactive, fundi not visualized, oral mucosa unremarkable NECK:  No jugular venous distention, waveform within normal limits, carotid upstroke brisk and symmetric, no bruits LYMPHATICS:  No cervical adenopathy LUNGS:  Clear to auscultation bilaterally HEART:  RRR.  PMI not displaced or sustained,S1 and S2 within normal limits, no S3, no S4, no clicks, no rubs, II/VI systolic murmur at the LUSB ABD:  Flat, positive bowel sounds normal in frequency in pitch, no bruits, no rebound, no guarding, no midline pulsatile mass, no hepatomegaly, no splenomegaly EXT:  2 plus pulses throughout, no edema, no cyanosis no clubbing SKIN:  No rashes no nodules NEURO:  Cranial nerves II  through XII grossly intact, motor grossly intact throughout PSYCH:  Cognitively intact, oriented to person place and time   EKG:  EKG is not ordered today. The ekg ordered 08/01/16 demonstrates sinus rhythm rate 98 bpm.  Inferolateral ST depression. 07/12/16: Sinus rhythm. Rate 86 bpm. Anterolateral T-wave inversions with 1 mm ST depression.   Lexiscan Myoview Summers:   The left ventricular ejection fraction is hyperdynamic (>65%).  Nuclear stress EF: 69%.  NSR with diffuse ST/T wave abnormality consistent with inferolateral ischemia and no change from baseline EKG  The study is normal.  This is a low risk study.   Echo 08/23/16: Study Conclusions  - Left ventricle: The cavity size was normal. Wall thickness was   increased in a pattern of moderate LVH. Systolic function was   vigorous. The estimated ejection fraction was in the range of 65%   to 70%. Wall motion was normal; there were no regional wall   motion abnormalities. Doppler parameters are consistent with   abnormal left ventricular relaxation (grade 1 diastolic   dysfunction). The E/e&' ratio is between 8-15, suggesting   indeterminate LV filling pressure. - Mitral valve: Mildly thickened leaflets . There was trivial   regurgitation. - Left atrium: The atrium was normal in size. - Tricuspid valve: There was mild regurgitation. - Pulmonary arteries: PA peak pressure: 38 mm Hg (S). - Inferior vena cava: The vessel was normal in size. The   respirophasic diameter changes were in the normal range (= 50%),   consistent with normal central venous pressure.   Recent Labs: No results found for requested labs within last 8760 hours.   2017: Sodium 143, potassium 3.9, BUN 13, creatinine 0.82 AST 36, ALT 42  Lipid Panel No results found for: CHOL, TRIG, HDL, CHOLHDL, VLDL, LDLCALC, LDLDIRECT    Wt Readings from Last 3 Encounters:  08/29/16 83.7 kg (184 lb 9.6 oz)  08/11/16 83 kg (183 lb)  08/01/16 83.3 kg  (183 lb 9.6 oz)      ASSESSMENT AND PLAN:   # Shortness of breath:  [name]'s EKG Shows ST depressions laterally. However, Lexiscan Myoview was negative for ischemia. I suspect that her shortness of breath is due to obesity and physical and activity. We discussed the importance of starting to exercise more regularly. She is mostly sleeping during the day and does not have any activities outside of the home. She previously enjoyed being part of a church community and I encouraged her to do so again. She does not have any evidence of heart failure on exam and her echo was unremarkable.   # Hypertension: BP well-controlled.  Continue amlodipine, lisinopril and HCTZ.   # Hyperlipidemia:  Continue atorvastatin.  This is managed by Dr. Allyne GeeSanders.  Current medicines are reviewed at length with the  patient today.  The patient does not have concerns regarding medicines.  The following changes have been made:  no change  Labs/ tests ordered today include:   No orders of the defined types were placed in this encounter.    Disposition:   FU with Betta Balla C. Duke Salvia, MD, [facility] as needed.  This note was written with the assistance of speech recognition software.  Please excuse any transcriptional errors.  Signed, Tannor Pyon C. Duke Salvia, MD, [facility]  08/29/2016 8:16 AM    [facility]

## 2016-08-29 NOTE — Patient Instructions (Signed)
Medication Instructions:  NO CHANGES   If you need a refill on your cardiac medications before your next appointment, please call your pharmacy.  Labwork: NONE  Follow-Up: Your physician recommends that you schedule as needed.      Thank you for choosing [facility]!!   Dr [name] MD

## 2016-10-19 ENCOUNTER — Telehealth: Payer: Self-pay | Admitting: *Deleted

## 2016-10-19 NOTE — Telephone Encounter (Signed)
Low risk for surgery. OK to hold aspirin 3 days prior to surgery.

## 2016-10-19 NOTE — Telephone Encounter (Signed)
Request for surgical clearance:  1. What type of surgery is being performed? TKA-MEDIAL & LATERAL W/WO PATELLA RESURFACING    2. When is this surgery scheduled? 10/31/16   3. Are there any medications that need to be held prior to surgery and how long?  4. Name of physician performing surgery? DR Charlann BoxerLIN   5. What is your office phone and fax number? PHONE [phone] FAX [phone] ATTN SHERRY WILLIS    Will forward to Dr [name] for review

## 2016-10-20 NOTE — Telephone Encounter (Signed)
Sent via Epic

## 2016-10-23 NOTE — H&P (Signed)
TOTAL KNEE ADMISSION H&P  Patient is being admitted for right total knee arthroplasty.  Subjective:  Chief Complaint:     Right knee primary OA / pain  HPI: Katherine Summers, 71 y.o. female, has a history of pain and functional disability in the right knee due to arthritis and has failed non-surgical conservative treatments for greater than 12 weeks to include NSAID's and/or analgesics, corticosteriod injections, viscosupplementation injections, use of assistive devices and activity modification.  Onset of symptoms was gradual, starting 2+ years ago with gradually worsening course since that time. The patient noted no past surgery on the right knee(s).  Patient currently rates pain in the right knee(s) at 10 out of 10 with activity. Patient has night pain, worsening of pain with activity and weight bearing, pain that interferes with activities of daily living, pain with passive range of motion, crepitus and joint swelling.  Patient has evidence of periarticular osteophytes and joint space narrowing by imaging studies.  There is no active infection.  Risks, benefits and expectations were discussed with the patient.  Risks including but not limited to the risk of anesthesia, blood clots, nerve damage, blood vessel damage, failure of the prosthesis, infection and up to and including death.  Patient understand the risks, benefits and expectations and wishes to proceed with surgery.   PCP: Gwynneth Aliment, MD  D/C Plans:       Home / SNF  Post-op Meds:       No Rx given  Tranexamic Acid:      To be given - IV   Decadron:      Is to be given  FYI:     ASA  Norco    Patient Active Problem List   Diagnosis Date Noted  . Murmur 08/01/2016  . Shortness of breath 08/01/2016  . Abnormal EKG 08/01/2016  . Essential hypertension 08/01/2016  . Depression 08/01/2016   Past Medical History:  Diagnosis Date  . Allergy   . Depression 08/01/2016  . Diabetes mellitus without complication (HCC)   .  Essential hypertension 08/01/2016  . Hypertension   . Murmur 08/01/2016  . Shortness of breath 08/01/2016    Past Surgical History:  Procedure Laterality Date  . FOOT SURGERY      No prescriptions prior to admission.   No Known Allergies   Social History  Substance Use Topics  . Smoking status: Former Games developer  . Smokeless tobacco: Not on file  . Alcohol use Not on file    Family History  Problem Relation Age of Onset  . Stroke Mother   . Colon cancer Father   . Colon cancer Sister   . Stroke Maternal Grandmother   . Colon cancer Paternal Grandfather      Review of Systems  Constitutional: Negative.   HENT: Negative.   Eyes: Negative.   Respiratory: Negative.   Cardiovascular: Negative.   Gastrointestinal: Negative.   Genitourinary: Negative.   Musculoskeletal: Positive for joint pain.  Skin: Negative.   Neurological: Negative.   Endo/Heme/Allergies: Positive for environmental allergies.  Psychiatric/Behavioral: Positive for depression.    Objective:  Physical Exam  Constitutional: She is oriented to person, place, and time. She appears well-developed.  HENT:  Head: Normocephalic.  Eyes: Pupils are equal, round, and reactive to light.  Neck: Neck supple. No JVD present. No tracheal deviation present. No thyromegaly present.  Cardiovascular: Normal rate, regular rhythm and intact distal pulses.   Murmur heard. Respiratory: Effort normal and breath sounds normal. No stridor.  No respiratory distress. She has no wheezes.  GI: Soft. There is no tenderness. There is no guarding.  Musculoskeletal:       Right knee: She exhibits decreased range of motion, swelling and bony tenderness. She exhibits no ecchymosis, no deformity, no laceration and no erythema. Tenderness found.  Lymphadenopathy:    She has no cervical adenopathy.  Neurological: She is alert and oriented to person, place, and time.  Skin: Skin is warm and dry.  Psychiatric: She has a normal mood and  affect.      Labs:  Estimated body mass index is 30.72 kg/m as calculated from the following:   Height as of 08/29/16: 5\' 5"  (1.651 m).   Weight as of 08/29/16: 83.7 kg (184 lb 9.6 oz).   Imaging Review Plain radiographs demonstrate severe degenerative joint disease of the right knee(s).  The bone quality appears to be good for age and reported activity level.  Assessment/Plan:  End stage arthritis, right knee   The patient history, physical examination, clinical judgment of the provider and imaging studies are consistent with end stage degenerative joint disease of the right knee(s) and total knee arthroplasty is deemed medically necessary. The treatment options including medical management, injection therapy arthroscopy and arthroplasty were discussed at length. The risks and benefits of total knee arthroplasty were presented and reviewed. The risks due to aseptic loosening, infection, stiffness, patella tracking problems, thromboembolic complications and other imponderables were discussed. The patient acknowledged the explanation, agreed to proceed with the plan and consent was signed. Patient is being admitted for inpatient treatment for surgery, pain control, PT, OT, prophylactic antibiotics, VTE prophylaxis, progressive ambulation and ADL's and discharge planning. The patient is planning to be discharged home.      Anastasio AuerbachMatthew S. Shalika Arntz   PA-C  10/23/2016, 2:31 PM

## 2016-10-24 ENCOUNTER — Other Ambulatory Visit (HOSPITAL_COMMUNITY): Payer: Self-pay | Admitting: Emergency Medicine

## 2016-10-24 NOTE — Patient Instructions (Addendum)
Katherine Summers  10/24/2016   Your procedure is scheduled on: 10-31-16  [facility]  [facility]tr[facility]  elevators to 3rd floor to  Short Stay Center at 10AM.  Call this number if you have problems the morning of surgery [phone]   Remember: ONLY 1 PERSON MAY GO WITH YOU TO SHORT STAY TO GET  READY MORNING OF YOUR SURGERY.  Do not eat food or drink liquids :After Midnight.     Take these medicines the morning of surgery with A SIP OF WATER: amlodipine(norvasc), atorvastatin(lipitor), escitalopram(lexapro), loratadine(claritin) as needed, inhaler as needed                                 You may not have any metal on your body including hair pins and              piercings  Do not wear jewelry, make-up, lotions, powders or perfumes, deodorant             Do not wear nail polish.  Do not shave  48 hours prior to surgery.              Men may shave face and neck.   Do not bring valuables to the hospital. [facility] IS NOT             RESPONSIBLE   FOR VALUABLES.  Contacts, dentures or bridgework may not be worn into surgery.  Leave suitcase in the car. After surgery it may be brought to your room.              Please read over the following fact sheets you were given: _____________________________________________________________________   Reviewed and Endorsed by [facility] Patient Education Committee, August 2015[facility] - Preparing for Surgery Before surgery, you can play an important role.  Because skin is not sterile, your skin needs to be as free of germs as possible.  You can reduce the number of germs on your skin by washing with CHG (chlorahexidine gluconate) soap before surgery.  CHG is an antiseptic cleaner which kills germs and bonds with the skin to continue killing germs even after washing. Please DO NOT use if you have an allergy to CHG or antibacterial soaps.  If your skin becomes reddened/irritated stop using the CHG and  inform your nurse when you arrive at Short Stay. Do not shave (including legs and underarms) for at least 48 hours prior to the first CHG shower.  You may shave your face/neck. Please follow these instructions carefully:  1.  Shower with CHG Soap the night before surgery and the  morning of Surgery.  2.  If you choose to wash your hair, wash your hair first as usual with your  normal  shampoo.  3.  After you shampoo, rinse your hair and body thoroughly to remove the  shampoo.                           4.  Use CHG as you would any other liquid soap.  You can apply chg directly  to the skin and wash                       Gently with a scrungie or clean washcloth.  5.  Apply the CHG Soap to your body ONLY FROM THE NECK DOWN.   Do not use on face/ open                           Wound or open sores. Avoid contact with eyes, ears mouth and genitals (private parts).                       Wash face,  Genitals (private parts) with your normal soap.             6.  Wash thoroughly, paying special attention to the area where your surgery  will be performed.  7.  Thoroughly rinse your body with warm water from the neck down.  8.  DO NOT shower/wash with your normal soap after using and rinsing off  the CHG Soap.                9.  Pat yourself dry with a clean towel.            10.  Wear clean pajamas.            11.  Place clean sheets on your bed the night of your first shower and do not  sleep with pets. Day of Surgery : Do not apply any lotions/deodorants the morning of surgery.  Please wear clean clothes to the hospital/surgery center.  FAILURE TO FOLLOW THESE INSTRUCTIONS MAY RESULT IN THE CANCELLATION OF YOUR SURGERY PATIENT SIGNATURE_________________________________  NURSE SIGNATURE__________________________________  ________________________________________________________________________   Reviewed and Endorsed by [facility] Patient Education Committee, August 2015 WHAT IS A BLOOD  TRANSFUSION? Blood Transfusion Information  A transfusion is the replacement of blood or some of its parts. Blood is made up of multiple cells which provide different functions.  Red blood cells carry oxygen and are used for blood loss replacement.  White blood cells fight against infection.  Platelets control bleeding.  Plasma helps clot blood.  Other blood products are available for specialized needs, such as hemophilia or other clotting disorders. BEFORE THE TRANSFUSION  Who gives blood for transfusions?   Healthy volunteers who are fully evaluated to make sure their blood is safe. This is blood bank blood. Transfusion therapy is the safest it has ever been in the practice of medicine. Before blood is taken from a donor, a complete history is taken to make sure that person has no history of diseases nor engages in risky social behavior (examples are intravenous drug use or sexual activity with multiple partners). The donor's travel history is screened to minimize risk of transmitting infections, such as malaria. The donated blood is tested for signs of infectious diseases, such as HIV and hepatitis. The blood is then tested to be sure it is compatible with you in order to minimize the chance of a transfusion reaction. If you or a relative donates blood, this is often done in anticipation of surgery and is not appropriate for emergency situations. It takes many days to process the donated blood. RISKS AND COMPLICATIONS Although transfusion therapy is very safe and saves many lives, the main dangers of transfusion include:   Getting an infectious disease.  Developing a transfusion reaction. This is an allergic reaction to something in the blood you were given. Every precaution is taken to prevent this. The decision to have a blood transfusion has been considered carefully by your caregiver before blood is given. Blood is not given unless the  benefits outweigh the risks. AFTER THE  TRANSFUSION  Right after receiving a blood transfusion, you will usually feel much better and more energetic. This is especially true if your red blood cells have gotten low (anemic). The transfusion raises the level of the red blood cells which carry oxygen, and this usually causes an energy increase.  The nurse administering the transfusion will monitor you carefully for complications. HOME CARE INSTRUCTIONS  No special instructions are needed after a transfusion. You may find your energy is better. Speak with your caregiver about any limitations on activity for underlying diseases you may have. SEEK MEDICAL CARE IF:   Your condition is not improving after your transfusion.  You develop redness or irritation at the intravenous (IV) site. SEEK IMMEDIATE MEDICAL CARE IF:  Any of the following symptoms occur over the next 12 hours:  Shaking chills.  You have a temperature by mouth above 102 F (38.9 C), not controlled by medicine.  Chest, back, or muscle pain.  People around you feel you are not acting correctly or are confused.  Shortness of breath or difficulty breathing.  Dizziness and fainting.  You get a rash or develop hives.  You have a decrease in urine output.  Your urine turns a dark color or changes to pink, red, or brown. Any of the following symptoms occur over the next 10 days:  You have a temperature by mouth above 102 F (38.9 C), not controlled by medicine.  Shortness of breath.  Weakness after normal activity.  The white part of the eye turns yellow (jaundice).  You have a decrease in the amount of urine or are urinating less often.  Your urine turns a dark color or changes to pink, red, or brown. Document Released: 08/18/2000 Document Revised: 11/13/2011 Document Reviewed: 04/06/2008 [facility] Patient Information 2014 [place], [place].  _______________________________________________________________________

## 2016-10-24 NOTE — Progress Notes (Signed)
LOV cardio Dr [name] 08-29-16 epic Cardio clearance [name] 10-19-16 epic/chart Stress test 08-11-16 epic ECHO 08-23-16 epic

## 2016-10-25 ENCOUNTER — Encounter (HOSPITAL_COMMUNITY): Payer: Self-pay | Admitting: Emergency Medicine

## 2016-10-25 ENCOUNTER — Encounter (HOSPITAL_COMMUNITY)
Admission: RE | Admit: 2016-10-25 | Discharge: 2016-10-25 | Disposition: A | Discharge status: Home / Self Care | Payer: Medicare Other | Source: Ambulatory Visit | Attending: Orthopedic Surgery | Admitting: Orthopedic Surgery

## 2016-10-25 DIAGNOSIS — Z01818 Encounter for other preprocedural examination: Secondary | ICD-10-CM | POA: Diagnosis present

## 2016-10-25 DIAGNOSIS — E119 Type 2 diabetes mellitus without complications: Secondary | ICD-10-CM | POA: Insufficient documentation

## 2016-10-25 DIAGNOSIS — I10 Essential (primary) hypertension: Secondary | ICD-10-CM | POA: Diagnosis not present

## 2016-10-25 HISTORY — DX: Chronic obstructive pulmonary disease, unspecified: J44.9

## 2016-10-25 HISTORY — DX: Prediabetes: R73.03

## 2016-10-25 LAB — BASIC METABOLIC PANEL
ANION GAP: 7 (ref 5–15)
BUN: 14 mg/dL (ref 6–20)
CALCIUM: 10.1 mg/dL (ref 8.9–10.3)
CO2: 30 mmol/L (ref 22–32)
CREATININE: 0.6 mg/dL (ref 0.44–1.00)
Chloride: 106 mmol/L (ref 101–111)
GLUCOSE: 95 mg/dL (ref 65–99)
Potassium: 3.6 mmol/L (ref 3.5–5.1)
Sodium: 143 mmol/L (ref 135–145)

## 2016-10-25 LAB — CBC
HEMATOCRIT: 40.8 % (ref 36.0–46.0)
Hemoglobin: 13.6 g/dL (ref 12.0–15.0)
MCH: 28.5 pg (ref 26.0–34.0)
MCHC: 33.3 g/dL (ref 30.0–36.0)
MCV: 85.5 fL (ref 78.0–100.0)
PLATELETS: 303 10*3/uL (ref 150–400)
RBC: 4.77 MIL/uL (ref 3.87–5.11)
RDW: 14.2 % (ref 11.5–15.5)
WBC: 9.2 10*3/uL (ref 4.0–10.5)

## 2016-10-25 LAB — ABO/RH: ABO/RH(D): B POS

## 2016-10-25 LAB — SURGICAL PCR SCREEN
MRSA, PCR: NEGATIVE
STAPHYLOCOCCUS AUREUS: NEGATIVE

## 2016-10-26 LAB — HEMOGLOBIN A1C
Hgb A1c MFr Bld: 5.8 % — ABNORMAL HIGH (ref 4.8–5.6)
MEAN PLASMA GLUCOSE: 120

## 2016-10-31 ENCOUNTER — Encounter (HOSPITAL_COMMUNITY)
Admission: RE | Disposition: A | Discharge status: Home / Self Care | Payer: Self-pay | Source: Ambulatory Visit | Attending: Orthopedic Surgery

## 2016-10-31 ENCOUNTER — Inpatient Hospital Stay (HOSPITAL_COMMUNITY): Payer: Medicare Other | Admitting: Anesthesiology

## 2016-10-31 ENCOUNTER — Encounter (HOSPITAL_COMMUNITY): Payer: Self-pay | Admitting: *Deleted

## 2016-10-31 ENCOUNTER — Inpatient Hospital Stay (HOSPITAL_COMMUNITY)
Admission: RE | Admit: 2016-10-31 | Discharge: 2016-11-02 | DRG: 470 | Disposition: A | Discharge status: Home / Self Care | Payer: Medicare Other | Source: Ambulatory Visit | Attending: Orthopedic Surgery | Admitting: Orthopedic Surgery

## 2016-10-31 DIAGNOSIS — M1711 Unilateral primary osteoarthritis, right knee: Principal | ICD-10-CM | POA: Diagnosis present

## 2016-10-31 DIAGNOSIS — F329 Major depressive disorder, single episode, unspecified: Secondary | ICD-10-CM | POA: Diagnosis present

## 2016-10-31 DIAGNOSIS — I10 Essential (primary) hypertension: Secondary | ICD-10-CM | POA: Diagnosis present

## 2016-10-31 DIAGNOSIS — Z6829 Body mass index (BMI) 29.0-29.9, adult: Secondary | ICD-10-CM

## 2016-10-31 DIAGNOSIS — E119 Type 2 diabetes mellitus without complications: Secondary | ICD-10-CM | POA: Diagnosis present

## 2016-10-31 DIAGNOSIS — Z87891 Personal history of nicotine dependence: Secondary | ICD-10-CM

## 2016-10-31 DIAGNOSIS — J449 Chronic obstructive pulmonary disease, unspecified: Secondary | ICD-10-CM | POA: Diagnosis present

## 2016-10-31 DIAGNOSIS — Z96651 Presence of right artificial knee joint: Secondary | ICD-10-CM

## 2016-10-31 DIAGNOSIS — Z96659 Presence of unspecified artificial knee joint: Secondary | ICD-10-CM

## 2016-10-31 DIAGNOSIS — M659 Synovitis and tenosynovitis, unspecified: Secondary | ICD-10-CM | POA: Diagnosis present

## 2016-10-31 DIAGNOSIS — E663 Overweight: Secondary | ICD-10-CM | POA: Diagnosis present

## 2016-10-31 HISTORY — PX: TOTAL KNEE ARTHROPLASTY: SHX125

## 2016-10-31 LAB — TYPE AND SCREEN
ABO/RH(D): B POS
Antibody Screen: NEGATIVE

## 2016-10-31 LAB — GLUCOSE, CAPILLARY: Glucose-Capillary: 100 mg/dL — ABNORMAL HIGH (ref 65–99)

## 2016-10-31 SURGERY — ARTHROPLASTY, KNEE, TOTAL
Anesthesia: Spinal | Site: Knee | Laterality: Right

## 2016-10-31 MED ORDER — FERROUS SULFATE 325 (65 FE) MG PO TABS: 325.0000 mg | ORAL_TABLET | Freq: Three times a day (TID) | ORAL | Status: DC

## 2016-10-31 MED ORDER — PHENYLEPHRINE HCL 10 MG/ML IJ SOLN: 30.0000 ug/min | INTRAVENOUS | Status: DC

## 2016-10-31 MED ORDER — MENTHOL 3 MG MT LOZG: 1.0000 | LOZENGE | OROMUCOSAL | Status: DC | PRN

## 2016-10-31 MED ORDER — METOCLOPRAMIDE HCL 5 MG/ML IJ SOLN: 5.0000 mg | Freq: Three times a day (TID) | INTRAMUSCULAR | Status: DC | PRN

## 2016-10-31 MED ORDER — METOCLOPRAMIDE HCL 5 MG/ML IJ SOLN: 10.0000 mg | Freq: Once | INTRAMUSCULAR | Status: DC | PRN

## 2016-10-31 MED ORDER — MIDAZOLAM HCL 2 MG/2ML IJ SOLN
INTRAMUSCULAR | Status: AC
  Filled 2016-10-31: qty 2

## 2016-10-31 MED ORDER — ATORVASTATIN CALCIUM 20 MG PO TABS
20.0000 mg | ORAL_TABLET | Freq: Every day | ORAL | Status: DC
  Administered 2016-11-01: 20 mg via ORAL
  Filled 2016-10-31 (×2): qty 1

## 2016-10-31 MED ORDER — HYDROMORPHONE HCL 1 MG/ML IJ SOLN
0.5000 mg | INTRAMUSCULAR | Status: DC | PRN
Start: 2016-10-31 — End: 2016-11-02

## 2016-10-31 MED ORDER — BUPIVACAINE IN DEXTROSE 0.75-8.25 % IT SOLN
INTRATHECAL | Status: DC | PRN
  Administered 2016-10-31: 1.8 mL via INTRATHECAL

## 2016-10-31 MED ORDER — METHOCARBAMOL 500 MG PO TABS
500.0000 mg | ORAL_TABLET | Freq: Four times a day (QID) | ORAL | Status: DC | PRN
  Administered 2016-10-31: 500 mg via ORAL
  Filled 2016-10-31: qty 1

## 2016-10-31 MED ORDER — ALUM & MAG HYDROXIDE-SIMETH 200-200-20 MG/5ML PO SUSP: 30.0000 mL | ORAL | Status: DC | PRN

## 2016-10-31 MED ORDER — ASPIRIN 81 MG PO CHEW: 81.0000 mg | CHEWABLE_TABLET | Freq: Two times a day (BID) | ORAL | 0 refills | Status: AC

## 2016-10-31 MED ORDER — SODIUM CHLORIDE 0.9 % IJ SOLN
INTRAMUSCULAR | Status: DC | PRN
  Administered 2016-10-31: 30 mL

## 2016-10-31 MED ORDER — DIPHENHYDRAMINE HCL 25 MG PO CAPS: 25.0000 mg | ORAL_CAPSULE | Freq: Four times a day (QID) | ORAL | Status: DC | PRN

## 2016-10-31 MED ORDER — PROPOFOL 10 MG/ML IV BOLUS
INTRAVENOUS | Status: AC
  Filled 2016-10-31: qty 20

## 2016-10-31 MED ORDER — METOCLOPRAMIDE HCL 5 MG PO TABS: 5.0000 mg | ORAL_TABLET | Freq: Three times a day (TID) | ORAL | Status: DC | PRN

## 2016-10-31 MED ORDER — PHENYLEPHRINE HCL 10 MG/ML IJ SOLN
INTRAVENOUS | Status: DC | PRN
  Administered 2016-10-31: 10 ug/min via INTRAVENOUS

## 2016-10-31 MED ORDER — MIDAZOLAM HCL 5 MG/5ML IJ SOLN
INTRAMUSCULAR | Status: DC | PRN
  Administered 2016-10-31 (×2): 1 mg via INTRAVENOUS

## 2016-10-31 MED ORDER — ONDANSETRON HCL 4 MG PO TABS: 4.0000 mg | ORAL_TABLET | Freq: Four times a day (QID) | ORAL | Status: DC | PRN

## 2016-10-31 MED ORDER — ASPIRIN 81 MG PO CHEW
81.0000 mg | CHEWABLE_TABLET | Freq: Two times a day (BID) | ORAL | Status: DC
  Administered 2016-10-31 – 2016-11-02 (×4): 81 mg via ORAL
  Filled 2016-10-31 (×4): qty 1

## 2016-10-31 MED ORDER — ONDANSETRON HCL 4 MG/2ML IJ SOLN
INTRAMUSCULAR | Status: DC | PRN
  Administered 2016-10-31: 4 mg via INTRAVENOUS

## 2016-10-31 MED ORDER — FERROUS SULFATE 325 (65 FE) MG PO TABS
325.0000 mg | ORAL_TABLET | Freq: Three times a day (TID) | ORAL | Status: DC
  Administered 2016-11-01 – 2016-11-02 (×3): 325 mg via ORAL
  Filled 2016-10-31 (×3): qty 1

## 2016-10-31 MED ORDER — PHENOL 1.4 % MT LIQD: 1.0000 | OROMUCOSAL | Status: DC | PRN

## 2016-10-31 MED ORDER — SODIUM CHLORIDE 0.9 % IR SOLN
Status: DC | PRN
  Administered 2016-10-31: 1000 mL

## 2016-10-31 MED ORDER — DOCUSATE SODIUM 100 MG PO CAPS
100.0000 mg | ORAL_CAPSULE | Freq: Two times a day (BID) | ORAL | Status: DC
  Administered 2016-10-31 – 2016-11-02 (×4): 100 mg via ORAL
  Filled 2016-10-31 (×4): qty 1

## 2016-10-31 MED ORDER — BUPIVACAINE HCL (PF) 0.25 % IJ SOLN
INTRAMUSCULAR | Status: AC
  Filled 2016-10-31: qty 30

## 2016-10-31 MED ORDER — HYDROCODONE-ACETAMINOPHEN 7.5-325 MG PO TABS
1.0000 | ORAL_TABLET | ORAL | Status: DC
  Administered 2016-10-31 – 2016-11-01 (×2): 1 via ORAL
  Administered 2016-11-01: 2 via ORAL
  Administered 2016-11-01: 1 via ORAL
  Administered 2016-11-01 – 2016-11-02 (×4): 2 via ORAL
  Filled 2016-10-31 (×3): qty 1
  Filled 2016-10-31: qty 2
  Filled 2016-10-31: qty 1
  Filled 2016-10-31 (×3): qty 2
  Filled 2016-10-31: qty 1

## 2016-10-31 MED ORDER — DEXAMETHASONE SODIUM PHOSPHATE 10 MG/ML IJ SOLN: 10.0000 mg | Freq: Once | INTRAMUSCULAR | Status: DC

## 2016-10-31 MED ORDER — KETOROLAC TROMETHAMINE 30 MG/ML IJ SOLN
INTRAMUSCULAR | Status: AC
  Filled 2016-10-31: qty 1

## 2016-10-31 MED ORDER — LACTATED RINGERS IV SOLN
INTRAVENOUS | Status: DC
  Administered 2016-10-31 (×2): via INTRAVENOUS

## 2016-10-31 MED ORDER — AMLODIPINE BESYLATE 5 MG PO TABS
5.0000 mg | ORAL_TABLET | Freq: Every day | ORAL | Status: DC
  Administered 2016-11-01 – 2016-11-02 (×2): 5 mg via ORAL
  Filled 2016-10-31 (×2): qty 1

## 2016-10-31 MED ORDER — SODIUM CHLORIDE 0.9 % IV SOLN
INTRAVENOUS | Status: DC
  Administered 2016-10-31 – 2016-11-01 (×2): via INTRAVENOUS
  Filled 2016-10-31 (×6): qty 1000

## 2016-10-31 MED ORDER — LORATADINE 10 MG PO TABS
10.0000 mg | ORAL_TABLET | Freq: Every day | ORAL | Status: DC
  Administered 2016-11-01 – 2016-11-02 (×2): 10 mg via ORAL
  Filled 2016-10-31 (×2): qty 1

## 2016-10-31 MED ORDER — FENTANYL CITRATE (PF) 100 MCG/2ML IJ SOLN: 25.0000 ug | INTRAMUSCULAR | Status: DC | PRN

## 2016-10-31 MED ORDER — BUPIVACAINE HCL (PF) 0.25 % IJ SOLN
INTRAMUSCULAR | Status: DC | PRN
  Administered 2016-10-31: 30 mL

## 2016-10-31 MED ORDER — CHLORHEXIDINE GLUCONATE 4 % EX LIQD: 60.0000 mL | Freq: Once | CUTANEOUS | Status: DC

## 2016-10-31 MED ORDER — PROPOFOL 500 MG/50ML IV EMUL
INTRAVENOUS | Status: DC | PRN
  Administered 2016-10-31: 75 ug/kg/min via INTRAVENOUS

## 2016-10-31 MED ORDER — ONDANSETRON HCL 4 MG/2ML IJ SOLN
INTRAMUSCULAR | Status: AC
  Filled 2016-10-31: qty 2

## 2016-10-31 MED ORDER — ONDANSETRON HCL 4 MG/2ML IJ SOLN: 4.0000 mg | Freq: Four times a day (QID) | INTRAMUSCULAR | Status: DC | PRN

## 2016-10-31 MED ORDER — METHOCARBAMOL 500 MG PO TABS: 500.0000 mg | ORAL_TABLET | Freq: Four times a day (QID) | ORAL | 0 refills | Status: DC | PRN

## 2016-10-31 MED ORDER — DEXAMETHASONE SODIUM PHOSPHATE 10 MG/ML IJ SOLN
10.0000 mg | Freq: Once | INTRAMUSCULAR | Status: AC
  Administered 2016-10-31: 10 mg via INTRAVENOUS
  Filled 2016-10-31: qty 1

## 2016-10-31 MED ORDER — TRANEXAMIC ACID 1000 MG/10ML IV SOLN
1000.0000 mg | INTRAVENOUS | Status: AC
  Administered 2016-10-31: 1000 mg via INTRAVENOUS
  Filled 2016-10-31: qty 1100

## 2016-10-31 MED ORDER — METHOCARBAMOL 1000 MG/10ML IJ SOLN
500.0000 mg | Freq: Four times a day (QID) | INTRAVENOUS | Status: DC | PRN
  Filled 2016-10-31: qty 5

## 2016-10-31 MED ORDER — ESCITALOPRAM OXALATE 5 MG PO TABS
5.0000 mg | ORAL_TABLET | Freq: Every day | ORAL | Status: DC
  Administered 2016-11-01 – 2016-11-02 (×2): 5 mg via ORAL
  Filled 2016-10-31 (×2): qty 1

## 2016-10-31 MED ORDER — CEFAZOLIN SODIUM-DEXTROSE 2-4 GM/100ML-% IV SOLN
2.0000 g | INTRAVENOUS | Status: AC
  Administered 2016-10-31: 2 g via INTRAVENOUS
  Filled 2016-10-31: qty 100

## 2016-10-31 MED ORDER — CEFAZOLIN SODIUM-DEXTROSE 2-4 GM/100ML-% IV SOLN
2.0000 g | Freq: Four times a day (QID) | INTRAVENOUS | Status: AC
  Administered 2016-10-31 – 2016-11-01 (×2): 2 g via INTRAVENOUS
  Filled 2016-10-31 (×2): qty 100

## 2016-10-31 MED ORDER — PROPOFOL 10 MG/ML IV BOLUS
INTRAVENOUS | Status: AC
  Filled 2016-10-31: qty 40

## 2016-10-31 MED ORDER — KETOROLAC TROMETHAMINE 30 MG/ML IJ SOLN
INTRAMUSCULAR | Status: DC | PRN
  Administered 2016-10-31: 30 mg

## 2016-10-31 MED ORDER — HYDROCODONE-ACETAMINOPHEN 7.5-325 MG PO TABS: 1.0000 | ORAL_TABLET | ORAL | 0 refills | Status: DC | PRN

## 2016-10-31 MED ORDER — ROPIVACAINE HCL 7.5 MG/ML IJ SOLN
INTRAMUSCULAR | Status: DC | PRN
Start: 2016-10-31 — End: 2016-10-31
  Administered 2016-10-31: 20 mL via PERINEURAL

## 2016-10-31 MED ORDER — DEXAMETHASONE SODIUM PHOSPHATE 10 MG/ML IJ SOLN
INTRAMUSCULAR | Status: AC
  Filled 2016-10-31: qty 1

## 2016-10-31 MED ORDER — MIDAZOLAM HCL 2 MG/2ML IJ SOLN
2.0000 mg | Freq: Once | INTRAMUSCULAR | Status: AC
  Administered 2016-10-31: 2 mg via INTRAVENOUS

## 2016-10-31 MED ORDER — BISACODYL 10 MG RE SUPP: 10.0000 mg | Freq: Every day | RECTAL | Status: DC | PRN

## 2016-10-31 MED ORDER — CELECOXIB 200 MG PO CAPS
200.0000 mg | ORAL_CAPSULE | Freq: Two times a day (BID) | ORAL | Status: DC
  Administered 2016-10-31 – 2016-11-02 (×4): 200 mg via ORAL
  Filled 2016-10-31 (×4): qty 1

## 2016-10-31 MED ORDER — POLYETHYLENE GLYCOL 3350 17 G PO PACK
17.0000 g | PACK | Freq: Two times a day (BID) | ORAL | Status: DC
  Administered 2016-11-01 (×2): 17 g via ORAL
  Filled 2016-10-31 (×3): qty 1

## 2016-10-31 MED ORDER — MEPERIDINE HCL 50 MG/ML IJ SOLN: 6.2500 mg | INTRAMUSCULAR | Status: DC | PRN

## 2016-10-31 MED ORDER — TIOTROPIUM BROMIDE MONOHYDRATE 1.25 MCG/ACT IN AERS: 1.0000 | INHALATION_SPRAY | Freq: Every day | RESPIRATORY_TRACT | Status: DC | PRN

## 2016-10-31 MED ORDER — POLYETHYLENE GLYCOL 3350 17 G PO PACK: 17.0000 g | PACK | Freq: Two times a day (BID) | ORAL | 0 refills | Status: DC

## 2016-10-31 MED ORDER — DOCUSATE SODIUM 100 MG PO CAPS: 100.0000 mg | ORAL_CAPSULE | Freq: Two times a day (BID) | ORAL | 0 refills | Status: DC

## 2016-10-31 MED ORDER — SODIUM CHLORIDE 0.9 % IJ SOLN
INTRAMUSCULAR | Status: AC
  Filled 2016-10-31: qty 50

## 2016-10-31 MED ORDER — MAGNESIUM CITRATE PO SOLN: 1.0000 | Freq: Once | ORAL | Status: DC | PRN

## 2016-10-31 SURGICAL SUPPLY — 49 items
ADH SKN CLS APL DERMABOND .7 (GAUZE/BANDAGES/DRESSINGS) ×1
BAG DECANTER FOR FLEXI CONT (MISCELLANEOUS) IMPLANT
BAG SPEC THK2 15X12 ZIP CLS (MISCELLANEOUS)
BAG ZIPLOCK 12X15 (MISCELLANEOUS) IMPLANT
BANDAGE ACE 6X5 VEL STRL LF (GAUZE/BANDAGES/DRESSINGS) ×2 IMPLANT
BLADE SAW SGTL 13.0X1.19X90.0M (BLADE) ×2 IMPLANT
BONE CEMENT GENTAMICIN (Cement) ×4 IMPLANT
BOWL SMART MIX CTS (DISPOSABLE) ×2 IMPLANT
CAPT KNEE TOTAL 3 ATTUNE ×1 IMPLANT
CEMENT BONE GENTAMICIN 40 (Cement) IMPLANT
CLOTH BEACON ORANGE TIMEOUT ST (SAFETY) ×2 IMPLANT
CUFF TOURN SGL QUICK 34 (TOURNIQUET CUFF) ×2
CUFF TRNQT CYL 34X4X40X1 (TOURNIQUET CUFF) ×1 IMPLANT
DECANTER SPIKE VIAL GLASS SM (MISCELLANEOUS) ×2 IMPLANT
DERMABOND ADVANCED (GAUZE/BANDAGES/DRESSINGS) ×1
DERMABOND ADVANCED .7 DNX12 (GAUZE/BANDAGES/DRESSINGS) ×1 IMPLANT
DRAPE U-SHAPE 47X51 STRL (DRAPES) ×2 IMPLANT
DRESSING AQUACEL AG SP 3.5X10 (GAUZE/BANDAGES/DRESSINGS) ×1 IMPLANT
DRSG AQUACEL AG SP 3.5X10 (GAUZE/BANDAGES/DRESSINGS) ×2
DURAPREP 26ML APPLICATOR (WOUND CARE) ×4 IMPLANT
ELECT REM PT RETURN 9FT ADLT (ELECTROSURGICAL) ×2
ELECTRODE REM PT RTRN 9FT ADLT (ELECTROSURGICAL) ×1 IMPLANT
GLOVE BIOGEL M 7.0 STRL (GLOVE) IMPLANT
GLOVE BIOGEL PI IND STRL 7.5 (GLOVE) ×1 IMPLANT
GLOVE BIOGEL PI IND STRL 8.5 (GLOVE) ×1 IMPLANT
GLOVE BIOGEL PI INDICATOR 7.5 (GLOVE) ×1
GLOVE BIOGEL PI INDICATOR 8.5 (GLOVE) ×1
GLOVE ECLIPSE 8.0 STRL XLNG CF (GLOVE) ×2 IMPLANT
GLOVE ORTHO TXT STRL SZ7.5 (GLOVE) ×4 IMPLANT
GOWN STRL REUS W/TWL LRG LVL3 (GOWN DISPOSABLE) ×2 IMPLANT
GOWN STRL REUS W/TWL XL LVL3 (GOWN DISPOSABLE) ×2 IMPLANT
HANDPIECE INTERPULSE COAX TIP (DISPOSABLE) ×2
LIQUID BAND (GAUZE/BANDAGES/DRESSINGS) ×1 IMPLANT
MANIFOLD NEPTUNE II (INSTRUMENTS) ×2 IMPLANT
PACK TOTAL KNEE CUSTOM (KITS) ×2 IMPLANT
POSITIONER SURGICAL ARM (MISCELLANEOUS) ×2 IMPLANT
SET HNDPC FAN SPRY TIP SCT (DISPOSABLE) ×1 IMPLANT
SET PAD KNEE POSITIONER (MISCELLANEOUS) ×2 IMPLANT
SUT MNCRL AB 4-0 PS2 18 (SUTURE) ×2 IMPLANT
SUT VIC AB 1 CT1 36 (SUTURE) ×2 IMPLANT
SUT VIC AB 2-0 CT1 27 (SUTURE) ×6
SUT VIC AB 2-0 CT1 TAPERPNT 27 (SUTURE) ×3 IMPLANT
SUT VLOC 180 0 24IN GS25 (SUTURE) ×2 IMPLANT
SYR 50ML LL SCALE MARK (SYRINGE) ×2 IMPLANT
TRAY FOLEY W/METER SILVER 14FR (SET/KITS/TRAYS/PACK) ×1 IMPLANT
TRAY FOLEY W/METER SILVER 16FR (SET/KITS/TRAYS/PACK) ×1 IMPLANT
WATER STERILE IRR 1500ML POUR (IV SOLUTION) ×3 IMPLANT
WRAP KNEE MAXI GEL POST OP (GAUZE/BANDAGES/DRESSINGS) ×2 IMPLANT
YANKAUER SUCT BULB TIP 10FT TU (MISCELLANEOUS) ×2 IMPLANT

## 2016-10-31 NOTE — Progress Notes (Signed)
AssistedDr. Carignan with right, ultrasound guided, adductor canal block. Side rails up, monitors on throughout procedure. See vital signs in flow sheet. Tolerated Procedure well.  

## 2016-10-31 NOTE — Transfer of Care (Signed)
Immediate Anesthesia Transfer of Care Note  Patient: Katherine Summers  Procedure(s) Performed: Procedure(s) with comments: RIGHT TOTAL KNEE ARTHROPLASTY (Right) - Requesting for 70 mins  Patient Location: PACU  Anesthesia Type:MAC and Spinal  Level of Consciousness: awake, alert  and oriented  Airway & Oxygen Therapy: Patient Spontanous Breathing and Patient connected to face mask oxygen  Post-op Assessment: Report given to RN and Post -op Vital signs reviewed and stable  Post vital signs: Reviewed and stable  Last Vitals:  Vitals:   10/31/16 1141 10/31/16 1145  BP:  (!) 146/76  Pulse: 82 82  Resp: 16 15  Temp:      Last Pain:  Vitals:   10/31/16 1015  TempSrc: Oral         Complications: No apparent anesthesia complications

## 2016-10-31 NOTE — Interval H&P Note (Signed)
History and Physical Interval Note:  10/31/2016 11:06 AM  Katherine Summers  has presented today for surgery, with the diagnosis of Right knee osteoarthritis  The various methods of treatment have been discussed with the patient and family. After consideration of risks, benefits and other options for treatment, the patient has consented to  Procedure(s) with comments: RIGHT TOTAL KNEE ARTHROPLASTY (Right) - Requesting for 70 mins as a surgical intervention .  The patient's history has been reviewed, patient examined, no change in status, stable for surgery.  I have reviewed the patient's chart and labs.  Questions were answered to the patient's satisfaction.     Shelda PalLIN,Suzannah Bettes D

## 2016-10-31 NOTE — Anesthesia Postprocedure Evaluation (Signed)
Anesthesia Post Note  Patient: Katherine CoriaBeverly M Summers  Procedure(s) Performed: Procedure(s) (LRB): RIGHT TOTAL KNEE ARTHROPLASTY (Right)  Patient location during evaluation: PACU Anesthesia Type: Spinal Level of consciousness: awake and alert Pain management: pain level controlled Vital Signs Assessment: post-procedure vital signs reviewed and stable Respiratory status: spontaneous breathing and respiratory function stable Cardiovascular status: blood pressure returned to baseline and stable Postop Assessment: no headache, no backache and spinal receding Anesthetic complications: no       Last Vitals:  Vitals:   10/31/16 1525 10/31/16 1625  BP: (!) 146/79 133/77  Pulse:    Resp: 16 13  Temp: 36.7 C 36.4 C    Last Pain:  Vitals:   10/31/16 1625  TempSrc: Oral  PainSc: Asleep                 Phillips Groutarignan, Fawnda Vitullo

## 2016-10-31 NOTE — Anesthesia Preprocedure Evaluation (Addendum)
Anesthesia Evaluation  Patient identified by MRN, date of birth, ID band Patient awake    Reviewed: Allergy & Precautions, NPO status , Patient's Chart, lab work & pertinent test results  Airway Mallampati: II  TM Distance: >3 FB Neck ROM: Full    Dental no notable dental hx.    Pulmonary COPD, former smoker,    Pulmonary exam normal breath sounds clear to auscultation       Cardiovascular hypertension, Pt. on medications Normal cardiovascular exam Rhythm:Regular Rate:Normal     Neuro/Psych negative neurological ROS  negative psych ROS   GI/Hepatic negative GI ROS, Neg liver ROS,   Endo/Other  negative endocrine ROS  Renal/GU negative Renal ROS  negative genitourinary   Musculoskeletal negative musculoskeletal ROS (+)   Abdominal   Peds negative pediatric ROS (+)  Hematology negative hematology ROS (+)   Anesthesia Other Findings   Reproductive/Obstetrics negative OB ROS                             Anesthesia Physical Anesthesia Plan  ASA: III  Anesthesia Plan: Spinal   Post-op Pain Management:  Regional for Post-op pain   Induction:   Airway Management Planned: Simple Face Mask  Additional Equipment:   Intra-op Plan:   Post-operative Plan:   Informed Consent: I have reviewed the patients History and Physical, chart, labs and discussed the procedure including the risks, benefits and alternatives for the proposed anesthesia with the patient or authorized representative who has indicated his/her understanding and acceptance.   Dental advisory given  Plan Discussed with: CRNA  Anesthesia Plan Comments: (Adductor block)        Anesthesia Quick Evaluation

## 2016-10-31 NOTE — Discharge Instructions (Signed)

## 2016-10-31 NOTE — Anesthesia Procedure Notes (Signed)
Spinal  Patient location during procedure: OR Start time: 10/31/2016 12:17 PM End time: 10/31/2016 12:20 PM Staffing Anesthesiologist: Montez Hageman Resident/CRNA: Chrystine Oiler G Performed: resident/CRNA  Spinal Block Patient position: sitting Prep: DuraPrep Patient monitoring: heart rate, continuous pulse ox and blood pressure Approach: midline Location: L2-3 Needle Needle type: Pencan  Needle gauge: 24 G Needle length: 10 cm Needle insertion depth: 6 cm Assessment Sensory level: T6 Additional Notes Kit expiration date checked, +CSF, -heme, -paraesthesia, patient tolerated well.

## 2016-10-31 NOTE — Op Note (Signed)
NAME:  Katherine Summers                      MEDICAL RECORD NO.:  [id number]                             [facility]      PHYSICIAN:  Madlyn FrankelMatthew D. Charlann Boxerlin, M.D.  DATE OF BIRTH:  [birth year]      DATE OF PROCEDURE:  10/31/2016                                     OPERATIVE REPORT         PREOPERATIVE DIAGNOSIS:  Right knee osteoarthritis.      POSTOPERATIVE DIAGNOSIS:  Right knee osteoarthritis.      FINDINGS:  The patient was noted to have complete loss of cartilage and   bone-on-bone arthritis with associated osteophytes in the patellofemoral and lateral compartments of   the knee with a significant synovitis and associated effusion.      PROCEDURE:  Right total knee replacement.      COMPONENTS USED:  DePuy Attune rotating platform posterior stabilized knee   system, a size 3N femur, 2 tibia, size 6 PS AOX insert, and 35 anatomic patellar   button.      SURGEON:  Madlyn FrankelMatthew D. Charlann Boxerlin, M.D.      ASSISTANT:  Lanney GinsMatthew Babish, PA-C.      ANESTHESIA:  Regional and Spinal.      SPECIMENS:  None.      COMPLICATION:  None.      DRAINS:  None.  EBL: <100cc      TOURNIQUET TIME:   Total Tourniquet Time Documented: Thigh (Right) - 29 minutes Total: Thigh (Right) - 29 minutes  .      The patient was stable to the recovery room.      INDICATION FOR PROCEDURE:  Katherine CoriaBeverly M Baumler is a 71 y.o. female patient of   mine.  The patient had been seen, evaluated, and treated conservatively in the   office with medication, activity modification, and injections.  The patient had   radiographic changes of bone-on-bone arthritis with endplate sclerosis and osteophytes noted.      The patient failed conservative measures including medication, injections, and activity modification, and at this point was ready for more definitive measures.   Based on the radiographic changes and failed conservative measures, the patient   decided to proceed with total knee replacement.  Risks of infection,   DVT,  component failure, need for revision surgery, postop course, and   expectations were all   discussed and reviewed.  Consent was obtained for benefit of pain   relief.      PROCEDURE IN DETAIL:  The patient was brought to the operative theater.   Once adequate anesthesia, preoperative antibiotics, 2 gm of Ancef, 1 gm of Tranexamic Acid, and 10 mg of Decadron administered, the patient was positioned supine with the right thigh tourniquet placed.  The  right lower extremity was prepped and draped in sterile fashion.  A time-   out was performed identifying the patient, planned procedure, and   extremity.      The right lower extremity [facility].  The leg was   exsanguinated, tourniquet elevated to 250 mmHg.  A midline incision was  made followed by median parapatellar arthrotomy.  Following initial   exposure, attention was first directed to the patella.  Precut   measurement was noted to be 21 mm.  I resected down to 13 mm and used a   35 anatomic patellar button to restore patellar height as well as cover the cut   surface.      The lug holes were drilled and a metal shim was placed to protect the   patella from retractors and saw blades.      At this point, attention was now directed to the femur.  The femoral   canal was opened with a drill, irrigated to try to prevent fat emboli.  An   intramedullary rod was passed at 3 degrees valgus, 8 mm of bone was   resected off the distal femur due to pre-op hyperextension.  Following this resection, the tibia was   subluxated anteriorly.  Using the extramedullary guide, 4 mm of bone was resected off   the proximal medial tibia.  We confirmed the gap would be   stable medially and laterally with a size 5 spacer block as well as confirmed   the cut was perpendicular in the coronal plane, checking with an alignment rod.      Once this was done, I sized the femur to be a size 3 in the anterior-   posterior dimension,  chose a narrow component based on medial and   lateral dimension.  The size 3 rotation block was then pinned in   position anterior referenced using the C-clamp to set rotation.  The   anterior, posterior, and  chamfer cuts were made without difficulty nor   notching making certain that I was along the anterior cortex to help   with flexion gap stability.      The final box cut was made off the lateral aspect of distal femur.      At this point, the tibia was sized to be a size 2, the size 2 tray was   then pinned in position through the medial third of the tubercle,   drilled, and keel punched.  Trial reduction was now carried with a 3 femur,  2 tibia, a size 5 then 6 PS insert, and the 35 anatomic patella botton.  The knee was brought to   extension, full extension with good flexion stability with the patella   tracking through the trochlea without application of pressure.  Given   all these findings the femoral lug holes were drilled and then the trial components removed.  Final components were   opened and cement was mixed.  The knee was irrigated with normal saline   solution and pulse lavage.  The synovial lining was   then injected with 20 cc of 0.25% Marcaine without epinephrine and 1 cc of Toradol plus 30 cc of NS for a total of 61 cc.      The knee was irrigated.  Final implants were then cemented onto clean and   dried cut surfaces of bone with the knee brought to extension with a size 6 PS trial insert.      Once the cement had fully cured, the excess cement was removed   throughout the knee.  I confirmed I was satisfied with the range of   motion and stability, and the final size 6 PS AOX insert was chosen.  It was   placed into the knee.      The tourniquet had been  let down at 29 minutes.  No significant   hemostasis required.  The   extensor mechanism was then reapproximated using #1 Vicryl and #0 V-lock sutures with the knee   in flexion.  The   remaining wound was  closed with 2-0 Vicryl and running 4-0 Monocryl.   The knee was cleaned, dried, dressed sterilely using Dermabond and   Aquacel dressing.  The patient was then   brought to recovery room in stable condition, tolerating the procedure   well.   Please note that Physician Assistant, Lanney Gins, PA-C, was present for the entirety of the case, and was utilized for pre-operative positioning, peri-operative retractor management, general facilitation of the procedure.  He was also utilized for primary wound closure at the end of the case.              Madlyn Frankel Charlann Boxer, M.D.    10/31/2016 1:45 PM

## 2016-10-31 NOTE — Anesthesia Procedure Notes (Signed)
Anesthesia Regional Block: Adductor canal block   Pre-Anesthetic Checklist: ,, timeout performed, Correct Patient, Correct Site, Correct Laterality, Correct Procedure, Correct Position, site marked, Risks and benefits discussed,  Surgical consent,  Pre-op evaluation,  At surgeon's request and post-op pain management  Laterality: Right and Lower  Prep: Maximum Sterile Barrier Precautions used, chloraprep       Needles:  Injection technique: Single-shot  Needle Type: Echogenic Stimulator Needle     Needle Length: 10cm      Additional Needles:   Procedures: ultrasound guided,,,,,,,,  Narrative:  Start time: 10/31/2016 11:31 AM End time: 10/31/2016 11:41 AM Injection made incrementally with aspirations every 5 mL.  Performed by: Personally  Anesthesiologist: Phillips GroutARIGNAN, Giulia Hickey  Additional Notes: Risks, benefits and alternative to block explained extensively.  Patient tolerated procedure well, without complications.

## 2016-11-01 DIAGNOSIS — E119 Type 2 diabetes mellitus without complications: Secondary | ICD-10-CM | POA: Diagnosis not present

## 2016-11-01 DIAGNOSIS — Z6829 Body mass index (BMI) 29.0-29.9, adult: Secondary | ICD-10-CM | POA: Diagnosis not present

## 2016-11-01 DIAGNOSIS — F329 Major depressive disorder, single episode, unspecified: Secondary | ICD-10-CM | POA: Diagnosis not present

## 2016-11-01 DIAGNOSIS — I10 Essential (primary) hypertension: Secondary | ICD-10-CM | POA: Diagnosis not present

## 2016-11-01 DIAGNOSIS — E663 Overweight: Secondary | ICD-10-CM | POA: Diagnosis present

## 2016-11-01 DIAGNOSIS — M1711 Unilateral primary osteoarthritis, right knee: Secondary | ICD-10-CM | POA: Diagnosis not present

## 2016-11-01 DIAGNOSIS — Z87891 Personal history of nicotine dependence: Secondary | ICD-10-CM | POA: Diagnosis not present

## 2016-11-01 DIAGNOSIS — J449 Chronic obstructive pulmonary disease, unspecified: Secondary | ICD-10-CM | POA: Diagnosis not present

## 2016-11-01 LAB — CBC
HCT: 34.6 % — ABNORMAL LOW (ref 36.0–46.0)
Hemoglobin: 11.4 g/dL — ABNORMAL LOW (ref 12.0–15.0)
MCH: 28.1 pg (ref 26.0–34.0)
MCHC: 32.9 g/dL (ref 30.0–36.0)
MCV: 85.2 fL (ref 78.0–100.0)
PLATELETS: 257 10*3/uL (ref 150–400)
RBC: 4.06 MIL/uL (ref 3.87–5.11)
RDW: 14.2 % (ref 11.5–15.5)
WBC: 10.8 10*3/uL — AB (ref 4.0–10.5)

## 2016-11-01 LAB — BASIC METABOLIC PANEL
Anion gap: 6 (ref 5–15)
BUN: 15 mg/dL (ref 6–20)
CALCIUM: 8.9 mg/dL (ref 8.9–10.3)
CO2: 25 mmol/L (ref 22–32)
Chloride: 109 mmol/L (ref 101–111)
Creatinine, Ser: 0.69 mg/dL (ref 0.44–1.00)
GLUCOSE: 145 mg/dL — AB (ref 65–99)
POTASSIUM: 3.8 mmol/L (ref 3.5–5.1)
SODIUM: 140 mmol/L (ref 135–145)

## 2016-11-01 MED ORDER — TIOTROPIUM BROMIDE MONOHYDRATE 18 MCG IN CAPS: 18.0000 ug | ORAL_CAPSULE | Freq: Every day | RESPIRATORY_TRACT | Status: DC | PRN

## 2016-11-01 NOTE — Progress Notes (Signed)
CSW consulted for SNF placement. PA / nsg reports [facility] RNCM will assist with d/c planning. CSW will remain available in case plan changes and SNF is required.  Cori RazorJamie Houa Nie LCSW [phone]

## 2016-11-01 NOTE — Progress Notes (Signed)
   11/01/16 1645  PT Visit Information  Last PT Received On 11/01/16  Assistance Needed +1  History of Present Illness s/p R TKA  Subjective Data  Subjective Pt ambulated in hallway again and plans to d/c home tomorrow.  Precautions  Precautions Knee;Fall  Restrictions  Other Position/Activity Restrictions WBAT  Pain Assessment  Pain Assessment 0-10  Pain Score 3  Pain Location R knee  Pain Descriptors / Indicators Aching;Sore  Pain Intervention(s) Limited activity within patient's tolerance;Monitored during session;Repositioned  Cognition  Arousal/Alertness Awake/alert  Behavior During Therapy WFL for tasks assessed/performed  Overall Cognitive Status Within Functional Limits for tasks assessed  Transfers  Overall transfer level Needs assistance  Equipment used Rolling walker (2 wheeled)  Transfers Sit to/from Stand  Sit to Stand Min assist  General transfer comment verbal cues for UE and LE positioning, assist to steady upon rise  Ambulation/Gait  Ambulation/Gait assistance Min guard  Ambulation Distance (Feet) 160 Feet  Assistive device Rolling walker (2 wheeled)  Gait Pattern/deviations Step-to pattern;Decreased stance time - right;Antalgic;Trunk flexed  General Gait Details verbal cues for sequence, RW positioning, posture  PT - End of Session  Activity Tolerance Patient tolerated treatment well  Patient left with call bell/phone within reach;in chair;with chair alarm set  PT - Assessment/Plan  PT Plan Current plan remains appropriate  PT Visit Diagnosis Other abnormalities of gait and mobility (R26.89);Pain  Pain - Right/Left Right  Pain - part of body Knee  PT Frequency (ACUTE ONLY) 7X/week  Follow Up Recommendations Outpatient PT  PT equipment Rolling walker with 5" wheels  AM-PAC PT "6 Clicks" Daily Activity Outcome Measure  Difficulty turning over in bed (including adjusting bedclothes, sheets and blankets)? 3  Difficulty moving from lying on back to sitting on  the side of the bed?  3  Difficulty sitting down on and standing up from a chair with arms (e.g., wheelchair, bedside commode, etc,.)? 3  Help needed moving to and from a bed to chair (including a wheelchair)? 3  Help needed walking in hospital room? 3  Help needed climbing 3-5 steps with a railing?  3  6 Click Score 18  Mobility G Code  CK  PT Goal Progression  Progress towards PT goals Progressing toward goals  PT Time Calculation  PT Start Time (ACUTE ONLY) 1346  PT Stop Time (ACUTE ONLY) 1403  PT Time Calculation (min) (ACUTE ONLY) 17 min  PT General Charges  $$ ACUTE PT VISIT 1 Procedure  PT Treatments  $Gait Training 8-22 mins   Zenovia JarredKati Nithin Demeo, PT, DPT 11/01/2016 Pager: [phone]

## 2016-11-01 NOTE — Evaluation (Signed)
Occupational Therapy Evaluation Patient Details Name: Katherine Summers MRN: [id number] DOB: [birth year] Today's Date: 11/01/2016    History of Present Illness s/p R TKA   Clinical Impression   This 71 year old female was admitted for the above sx. She will benefit from continued OT as she plans to spend much of her time alone.  Goals are for supervision to mod I level in acute. She is usually independent/mod I.  Currently, she needs up to mod A for LB ADLs    Follow Up Recommendations  Supervision/Assistance - 24 hour    Equipment Recommendations  None recommended by OT    Recommendations for Other Services       Precautions / Restrictions Precautions Precautions: Knee;Fall Restrictions Weight Bearing Restrictions: No      Mobility Bed Mobility Overal bed mobility: Needs Assistance Bed Mobility: Supine to Sit     Supine to sit: Supervision;HOB elevated     General bed mobility comments: cues for sequence  Transfers Overall transfer level: Needs assistance Equipment used: Rolling walker (2 wheeled) Transfers: Sit to/from Stand Sit to Stand: Min assist         General transfer comment: assist to rise and stabilize.  Cues for UE/LE placement    Balance                                            ADL Overall ADL's : Needs assistance/impaired Eating/Feeding: Independent   Grooming: Wash/dry hands;Supervision/safety;Standing   Upper Body Bathing: Set up;Sitting   Lower Body Bathing: Moderate assistance;Sit to/from stand   Upper Body Dressing : Set up;Sitting   Lower Body Dressing: Moderate assistance;Sit to/from stand (sock aide)   Toilet Transfer: Minimal assistance;Ambulation;BSC;RW   Toileting- Clothing Manipulation and Hygiene: Minimal assistance;Sit to/from stand       Functional mobility during ADLs: Min guard;Rolling walker General ADL Comments: performed adl then ambulated to bathroom to use toilet.  Pt has a sock aide at  home:  will address AE on next visit as pt wants to be independent and not need much assistance from sons, who will not be staying with her.  She has a tub bench, which she bought from a neighbor, but she doesn't know how to use this. Will address this on next visit also     Vision         Perception     Praxis      Pertinent Vitals/Pain Pain Assessment: Faces Faces Pain Scale: Hurts even more Pain Location: R knee with weight bearing Pain Descriptors / Indicators: Grimacing Pain Intervention(s): Limited activity within patient's tolerance;Monitored during session;Premedicated before session;Repositioned;Ice applied     Hand Dominance     Extremity/Trunk Assessment Upper Extremity Assessment Upper Extremity Assessment: Overall WFL for tasks assessed           Communication Communication Communication: No difficulties   Cognition Arousal/Alertness: Awake/alert Behavior During Therapy: WFL for tasks assessed/performed Overall Cognitive Status: Within Functional Limits for tasks assessed                     General Comments       Exercises       Shoulder Instructions      Home Living Family/patient expects to be discharged to:: Private residence Living Arrangements: Alone Available Help at Discharge: Family;Available PRN/intermittently (sons)  Bathroom Shower/Tub: Therapist, sports characteristics: Engineer, building services: Standard     Home Equipment: Tub bench;Toilet riser          Prior Functioning/Environment Level of Independence: Independent                 OT Problem List: Decreased activity tolerance;Decreased knowledge of use of DME or AE;Pain      OT Treatment/Interventions: Self-care/ADL training;DME and/or AE instruction;Patient/family education    OT Goals(Current goals can be found in the care plan section) Acute Rehab OT Goals Patient Stated Goal: be independent OT Goal Formulation: With  patient Time For Goal Achievement: 11/04/16 Potential to Achieve Goals: Good ADL Goals Pt Will Perform Grooming: with modified independence;standing Pt Will Perform Lower Body Bathing: with set-up;with supervision;sit to/from stand;with adaptive equipment Pt Will Perform Lower Body Dressing: with supervision;with adaptive equipment;sit to/from stand;with set-up Pt Will Transfer to Toilet: with supervision;ambulating;bedside commode Pt Will Perform Toileting - Clothing Manipulation and hygiene: with modified independence;sit to/from stand Pt Will Perform Tub/Shower Transfer: Tub transfer;with supervision;ambulating;tub bench Additional ADL Goal #1: pt will perform bed mobility at mod I level from flat bed with AE/adapted technique as needed  OT Frequency: Min 2X/week   Barriers to D/C:            Co-evaluation              End of Session    Activity Tolerance: Patient tolerated treatment well Patient left: in chair;with call bell/phone within reach;with chair alarm set  OT Visit Diagnosis: Pain Pain - Right/Left: Right Pain - part of body: Knee                ADL either performed or assessed with clinical judgement  Time: 0902-0935 OT Time Calculation (min): 33 min Charges:  OT General Charges $OT Visit: 1 Procedure OT Evaluation $OT Eval Low Complexity: 1 Procedure OT Treatments $Self Care/Home Management : 8-22 mins G-Codes:     [place], OTR/L [phone] 11/01/2016  Katherine Summers 11/01/2016, 10:00 AM

## 2016-11-01 NOTE — Care Management Note (Signed)
Case Management Note  Patient Details  Name: Katherine Summers MRN: [id number] Date of Birth: 10/05/1945  Subjective/Objective:                  TKA Action/Plan: Discharge planning Expected Discharge Date:  11/01/16               Expected Discharge Plan:  Home/Self Care  In-House Referral:     Discharge planning Services  CM Consult  Post Acute Care Choice:  Home Health Choice offered to:  Patient  DME Arranged:  N/A DME Agency:  NA  HH Arranged:  Patient Refused HH Agency:  NA  Status of Service:  Completed, signed off  If discussed at Long Length of Stay Meetings, dates discussed:    Additional Comments: CM met with pt in room to offer choice of home health agency; pt states she is not having HHPT as she is already scheduled for outpt PT.  CM notified RN.  Pt states she has all DME needed at home. No other CM needs were communicated. Jeffries, Sarah Christine, RN 11/01/2016, 12:39 PM  

## 2016-11-01 NOTE — Progress Notes (Signed)
     Subjective: 1 Day Post-Op Procedure(s) (LRB): RIGHT TOTAL KNEE ARTHROPLASTY (Right)   Patient reports pain as moderate, not fully controlled.  Will have her stay today to work with PT to help with pain as well as for safety issues with returning home.   Objective:   VITALS:   Vitals:   11/01/16 0630 11/01/16 0643  BP:  112/70  Pulse: 79 76  Resp:  14  Temp:  98.5 F (36.9 C)    Dorsiflexion/Plantar flexion intact Incision: dressing C/D/I No cellulitis present Compartment soft  LABS  Recent Labs  11/01/16 0435  HGB 11.4*  HCT 34.6*  WBC 10.8*  PLT 257     Recent Labs  11/01/16 0435  NA 140  K 3.8  BUN 15  CREATININE 0.69  GLUCOSE 145*     Assessment/Plan: 1 Day Post-Op Procedure(s) (LRB): RIGHT TOTAL KNEE ARTHROPLASTY (Right) Foley cath d/c'ed Advance diet Up with therapy D/C IV fluids Discharge home with home health, eventually when ready  Overweight (BMI 25-29.9) Estimated body mass index is 29.99 kg/m as calculated from the following:   Height as of this encounter: 5' 5.5" (1.664 m).   Weight as of this encounter: 83 kg (183 lb). Patient also counseled that weight may inhibit the healing process Patient counseled that losing weight will help with future health issues      Katherine AuerbachMatthew S. Katherine Summers   PAC  11/01/2016, 9:06 AM

## 2016-11-01 NOTE — Progress Notes (Signed)
   11/01/16 1400  OT Visit Information  Last OT Received On 11/01/16  Assistance Needed +1  History of Present Illness s/p R TKA  Precautions  Precautions Knee;Fall  Pain Assessment  Pain Score 2  Pain Location R knee  Pain Descriptors / Indicators Aching;Sore  Pain Intervention(s) Limited activity within patient's tolerance;Monitored during session;Premedicated before session;Repositioned (removed ice)  Cognition  Arousal/Alertness Awake/alert  Behavior During Therapy WFL for tasks assessed/performed  Overall Cognitive Status Within Functional Limits for tasks assessed  ADL  Lower Body Dressing Minimal assistance;Sit to/from stand;With adaptive equipment  General ADL Comments pt practiced with reacher (which she has) and sock aide.  She does have a different model of sock aide at home.  She believes it will accommodate her ted hose. She does not want sons to have to do any personal care for her.  Restrictions  Other Position/Activity Restrictions WBAT  OT - End of Session  Activity Tolerance Patient tolerated treatment well  Patient left in chair;with call bell/phone within reach;with chair alarm set  AM-PAC OT "6 Clicks" Daily Activity Outcome Measure  Help from another person eating meals? 4  Help from another person taking care of personal grooming? 3  Help from another person toileting, which includes using toliet, bedpan, or urinal? 3  Help from another person bathing (including washing, rinsing, drying)? 3  Help from another person to put on and taking off regular upper body clothing? 3  Help from another person to put on and taking off regular lower body clothing? 3  6 Click Score 19  ADL G Code Conversion CK  OT Goal Progression  Progress towards OT goals Progressing toward goals  OT Time Calculation  OT Start Time (ACUTE ONLY) 1322  OT Stop Time (ACUTE ONLY) 1332  OT Time Calculation (min) 10 min  OT General Charges  $OT Visit 1 Procedure  OT Treatments  $Self  Care/Home Management  8-22 mins  Marica OtterMaryellen Estill Llerena, OTR/L [phone] 11/01/2016

## 2016-11-01 NOTE — Evaluation (Signed)
Physical Therapy Evaluation Patient Details Name: Katherine CoriaBeverly M Kindig MRN: [id number] DOB: 08-Mar-1946 Today's Date: 11/01/2016   History of Present Illness  s/p R TKA  Clinical Impression  Pt is s/p TKA resulting in the deficits listed below (see PT Problem List).  Pt will benefit from skilled PT to increase their independence and safety with mobility to allow discharge to the venue listed below.  Pt assisted with ambulating in hallway and performed LE exercises POD #1.  Pt plans to d/c home likely tomorrow with assist from sons.     Follow Up Recommendations Outpatient PT (plans for OP PT per pt)    Equipment Recommendations  Rolling walker with 5" wheels    Recommendations for Other Services       Precautions / Restrictions Precautions Precautions: Knee;Fall Restrictions Weight Bearing Restrictions: No Other Position/Activity Restrictions: WBAT      Mobility  Bed Mobility Overal bed mobility: Needs Assistance Bed Mobility: Supine to Sit     Supine to sit: Supervision;HOB elevated     General bed mobility comments: pt up in recliner on arrival  Transfers Overall transfer level: Needs assistance Equipment used: Rolling walker (2 wheeled) Transfers: Sit to/from Stand Sit to Stand: Min assist         General transfer comment: verbal cues for UE and LE positioning, assist to steady upon rise  Ambulation/Gait Ambulation/Gait assistance: Min guard Ambulation Distance (Feet): 100 Feet Assistive device: Rolling walker (2 wheeled) Gait Pattern/deviations: Step-to pattern;Decreased stance time - right;Antalgic;Trunk flexed     General Gait Details: verbal cues for sequence, RW positioning, posture  Stairs            Wheelchair Mobility    Modified Rankin (Stroke Patients Only)       Balance                                             Pertinent Vitals/Pain Pain Assessment: 0-10 Pain Score: 2  Faces Pain Scale: Hurts even more Pain  Location: R knee Pain Descriptors / Indicators: Aching;Sore Pain Intervention(s): Monitored during session;Repositioned;Limited activity within patient's tolerance;Ice applied    Home Living Family/patient expects to be discharged to:: Private residence Living Arrangements: Alone Available Help at Discharge: Family;Available PRN/intermittently (sons) Type of Home: House Home Access: Level entry     Home Layout: One level Home Equipment: Tub bench;Toilet riser      Prior Function Level of Independence: Independent               Hand Dominance        Extremity/Trunk Assessment   Upper Extremity Assessment Upper Extremity Assessment: Overall WFL for tasks assessed    Lower Extremity Assessment Lower Extremity Assessment: RLE deficits/detail RLE Deficits / Details: able to perform SLR, AAROM 35* knee flexion, lacking approx 3* extension       Communication   Communication: No difficulties  Cognition Arousal/Alertness: Awake/alert Behavior During Therapy: WFL for tasks assessed/performed Overall Cognitive Status: Within Functional Limits for tasks assessed                      General Comments      Exercises Total Joint Exercises Ankle Circles/Pumps: AROM;10 reps;Both Quad Sets: AROM;Right;10 reps Short Arc QuadBarbaraann Boys: AAROM;Right;10 reps Heel Slides: AAROM;Right;10 reps Hip ABduction/ADduction: AROM;Right;10 reps Straight Leg Raises: AROM;Right;10 reps   Assessment/Plan    PT Assessment  Patient needs continued PT services  PT Problem List Decreased strength;Decreased range of motion;Decreased knowledge of use of DME;Pain;Decreased mobility       PT Treatment Interventions Functional mobility training;Stair training;Gait training;DME instruction;Therapeutic activities;Therapeutic exercise;Patient/family education    PT Goals (Current goals can be found in the Care Plan section)  Acute Rehab PT Goals Patient Stated Goal: be independent PT Goal  Formulation: With patient Time For Goal Achievement: 11/04/16 Potential to Achieve Goals: Good    Frequency 7X/week   Barriers to discharge        Co-evaluation               End of Session   Activity Tolerance: Patient tolerated treatment well Patient left: in chair;with call bell/phone within reach;with chair alarm set   PT Visit Diagnosis: Other abnormalities of gait and mobility (R26.89);Pain Pain - Right/Left: Right Pain - part of body: Knee         Time: 4540-9811 PT Time Calculation (min) (ACUTE ONLY): 17 min   Charges:   PT Evaluation $PT Eval Low Complexity: 1 Procedure     PT G Codes:         Alexyia Guarino,KATHrine E 11/01/2016, 11:51 AM Zenovia Jarred, PT, DPT 11/01/2016 Pager: [phone]

## 2016-11-02 LAB — BASIC METABOLIC PANEL
ANION GAP: 7 (ref 5–15)
BUN: 21 mg/dL — AB (ref 6–20)
CHLORIDE: 111 mmol/L (ref 101–111)
CO2: 27 mmol/L (ref 22–32)
Calcium: 9.2 mg/dL (ref 8.9–10.3)
Creatinine, Ser: 0.66 mg/dL (ref 0.44–1.00)
GFR calc Af Amer: >60 mL/min (ref >60)
GLUCOSE: 78 mg/dL (ref 65–99)
POTASSIUM: 3.9 mmol/L (ref 3.5–5.1)
Sodium: 145 mmol/L (ref 135–145)

## 2016-11-02 LAB — CBC
HCT: 33.1 % — ABNORMAL LOW (ref 36.0–46.0)
HEMOGLOBIN: 10.8 g/dL — AB (ref 12.0–15.0)
MCH: 28 pg (ref 26.0–34.0)
MCHC: 32.6 g/dL (ref 30.0–36.0)
MCV: 85.8 fL (ref 78.0–100.0)
PLATELETS: 256 10*3/uL (ref 150–400)
RBC: 3.86 MIL/uL — AB (ref 3.87–5.11)
RDW: 14.8 % (ref 11.5–15.5)
WBC: 10.6 10*3/uL — AB (ref 4.0–10.5)

## 2016-11-02 NOTE — Progress Notes (Addendum)
     Subjective: 2 Days Post-Op Procedure(s) (LRB): RIGHT TOTAL KNEE ARTHROPLASTY (Right)   Patient reports pain as mild, pain controlled. No events throughout the night.  Feels that she is doing well.  States that she has an appointment for OPPT on Friday, she doesn't want anyone coming to her house. Labs pending, they were late processing today.  Ready to be discharged home.   Objective:   VITALS:   Vitals:   11/01/16 2114 11/02/16 0457  BP: 119/72 (!) 158/86  Pulse: 80 75  Resp: 16 16  Temp: 98.2 F (36.8 C) 98.6 F (37 C)    Dorsiflexion/Plantar flexion intact Incision: dressing C/D/I No cellulitis present Compartment soft  LABS  Recent Labs  11/01/16 0435  HGB 11.4*  HCT 34.6*  WBC 10.8*  PLT 257     Recent Labs  11/01/16 0435  NA 140  K 3.8  BUN 15  CREATININE 0.69  GLUCOSE 145*     Assessment/Plan: 2 Days Post-Op Procedure(s) (LRB): RIGHT TOTAL KNEE ARTHROPLASTY (Right) Up with therapy Discharge home [facility]. Follow up with OLIN,Roseanne Juenger D in 2 weeks.  Contact information:  [facility] [address], [address] [place] [id number] [phone]    Overweight (BMI 25-29.9) Estimated body mass index is 29.99 kg/m as calculated from the following:   Height as of this encounter: 5' 5.5" (1.664 m).   Weight as of this encounter: 83 kg (183 lb). Patient also counseled that weight may inhibit the healing process Patient counseled that losing weight will help with future health issues        Anastasio AuerbachMatthew S. Kenzly Rogoff   PAC  11/02/2016, 7:37 AM

## 2016-11-02 NOTE — Progress Notes (Signed)
Pt to d/c home. No DME needs. AVS reviewed and "My Chart" discussed with pt. Pt capable of verbalizing medications, signs and symptoms of infection, and follow-up appointments. Remains hemodynamically stable. No signs and symptoms of distress. Educated pt to return to ER in the case of SOB, dizziness, or chest pain.  

## 2016-11-02 NOTE — Discharge Summary (Signed)
Physician Discharge Summary  Patient ID: Katherine Summers MRN: [id number] DOB/AGE: Feb 08, 1946 71 y.o.  Admit date: 10/31/2016 Discharge date: 11/02/2016   Procedures:  Procedure(s) (LRB): RIGHT TOTAL KNEE ARTHROPLASTY (Right)  Attending Physician:  Dr. Durene RomansMatthew Olin   Admission Diagnoses:   Right knee primary OA / pain  Discharge Diagnoses:  Principal Problem:   S/P right TKA Active Problems:   Overweight (BMI 25.0-29.9)  Past Medical History:  Diagnosis Date  . Allergy   . COPD (chronic obstructive pulmonary disease) (HCC)   . Depression 08/01/2016  . Essential hypertension 08/01/2016  . Hypertension   . Murmur 08/01/2016  . Pre-diabetes   . Shortness of breath 08/01/2016    HPI:    Katherine Summers, 71 y.o. female, has a history of pain and functional disability in the right knee due to arthritis and has failed non-surgical conservative treatments for greater than 12 weeks to include NSAID's and/or analgesics, corticosteriod injections, viscosupplementation injections, use of assistive devices and activity modification.  Onset of symptoms was gradual, starting 2+ years ago with gradually worsening course since that time. The patient noted no past surgery on the right knee(s).  Patient currently rates pain in the right knee(s) at 10 out of 10 with activity. Patient has night pain, worsening of pain with activity and weight bearing, pain that interferes with activities of daily living, pain with passive range of motion, crepitus and joint swelling.  Patient has evidence of periarticular osteophytes and joint space narrowing by imaging studies.  There is no active infection.  Risks, benefits and expectations were discussed with the patient.  Risks including but not limited to the risk of anesthesia, blood clots, nerve damage, blood vessel damage, failure of the prosthesis, infection and up to and including death.  Patient understand the risks, benefits and expectations and wishes to  proceed with surgery.  PCP: Gwynneth Alimentobyn N Sanders, MD   Discharged Condition: good  Hospital Course:  Patient underwent the above stated procedure on 10/31/2016. Patient tolerated the procedure well and brought to the recovery room in good condition and subsequently to the floor.  POD #1 BP: 112/70 ; Pulse: 76 ; Temp: 98.5 F (36.9 C) ; Resp: 14 Patient reports pain as moderate, not fully controlled.  Will have her stay today to work with PT to help with pain as well as for safety issues with returning home.  Dorsiflexion/plantar flexion intact, incision: dressing C/D/I, no cellulitis present and compartment soft.   LABS  Basename    HGB     11.4  HCT     34.6   POD #2  BP: 158/86 ; Pulse: 75 ; Temp: 98.6 F (37 C) ; Resp: 16 Patient reports pain as mild, pain controlled. No events throughout the night.  Feels that she is doing well.  States that she has an appointment for OPPT on Friday, she doesn't want anyone coming to her house. Labs pending, they were late processing today.  Ready to be discharged home. Dorsiflexion/plantar flexion intact, incision: dressing C/D/I, no cellulitis present and compartment soft.   LABS  Basename    HGB     11.4  HCT     34.6    Discharge Exam: General appearance: alert, cooperative and no distress Extremities: Homans sign is negative, no sign of DVT, no edema, redness or tenderness in the calves or thighs and no ulcers, gangrene or trophic changes  Disposition: Home with follow up in 2 weeks   Follow-up Information  Shelda Pal, MD. Schedule an appointment as soon as possible for a visit in 2 week(s).   Specialty:  Orthopedic Surgery Contact information: [address] [place] [address] [phone]           Discharge Instructions    Call MD / Call 911    Complete by:  As directed    If you experience chest pain or shortness of breath, CALL 911 and be transported to the hospital emergency room.  If you develope  a fever above 101 F, pus (white drainage) or increased drainage or redness at the wound, or calf pain, call your surgeon's office.   Change dressing    Complete by:  As directed    Maintain surgical dressing until follow up in the clinic. If the edges start to pull up, may reinforce with tape. If the dressing is no longer working, may remove and cover with gauze and tape, but must keep the area dry and clean.  Call with any questions or concerns.   Constipation Prevention    Complete by:  As directed    Drink plenty of fluids.  Prune juice may be helpful.  You may use a stool softener, such as Colace (over the counter) 100 mg twice a day.  Use MiraLax (over the counter) for constipation as needed.   Diet - low sodium heart healthy    Complete by:  As directed    Discharge instructions    Complete by:  As directed    Maintain surgical dressing until follow up in the clinic. If the edges start to pull up, may reinforce with tape. If the dressing is no longer working, may remove and cover with gauze and tape, but must keep the area dry and clean.  Follow up in 2 weeks at [facility]. Call with any questions or concerns.   Increase activity slowly as tolerated    Complete by:  As directed    Weight bearing as tolerated with assist device (walker, cane, etc) as directed, use it as long as suggested by your surgeon or therapist, typically at least 4-6 weeks.   TED hose    Complete by:  As directed    Use stockings (TED hose) for 2 weeks on both leg(s).  You may remove them at night for sleeping.      Allergies as of 11/02/2016   No Known Allergies     Medication List    STOP taking these medications   aspirin 81 MG tablet Replaced by:  aspirin 81 MG chewable tablet     TAKE these medications   amLODipine 5 MG tablet Commonly known as:  NORVASC Take 5 mg by mouth daily.   aspirin 81 MG chewable tablet Chew 1 tablet (81 mg total) by mouth 2 (two) times daily. Take for 4  weeks. Replaces:  aspirin 81 MG tablet   atorvastatin 20 MG tablet Commonly known as:  LIPITOR Take 20 mg by mouth daily.   docusate sodium 100 MG capsule Commonly known as:  COLACE Take 1 capsule (100 mg total) by mouth 2 (two) times daily.   escitalopram 5 MG tablet Commonly known as:  LEXAPRO Take 5 mg by mouth daily.   ferrous sulfate 325 (65 FE) MG tablet Commonly known as:  FERROUSUL Take 1 tablet (325 mg total) by mouth 3 (three) times daily with meals.   HYDROcodone-acetaminophen 7.5-325 MG tablet Commonly known as:  NORCO Take 1-2 tablets by mouth every 4 (four) hours  as needed for moderate pain or severe pain.   lisinopril-hydrochlorothiazide 20-12.5 MG tablet Commonly known as:  PRINZIDE,ZESTORETIC Take 1 tablet by mouth daily.   loratadine 10 MG tablet Commonly known as:  CLARITIN Take 1 tablet by mouth daily.   methocarbamol 500 MG tablet Commonly known as:  ROBAXIN Take 1 tablet (500 mg total) by mouth every 6 (six) hours as needed for muscle spasms.   polyethylene glycol packet Commonly known as:  MIRALAX / GLYCOLAX Take 17 g by mouth 2 (two) times daily.   SPIRIVA RESPIMAT 1.25 MCG/ACT Aers Generic drug:  Tiotropium Bromide Monohydrate Inhale 1 puff into the lungs daily as needed (shortness of breath).   Vitamin D3 2000 units Tabs Take 2,000 Units by mouth daily. What changed:  Another medication with the same name was removed. Continue taking this medication, and follow the directions you see here.            Durable Medical Equipment        Start     Ordered   10/31/16 1531  DME Walker rolling  Once    Question:  Patient needs a walker to treat with the following condition  Answer:  Status post right knee replacement   10/31/16 1530   10/31/16 1531  DME 3 n 1  Once     10/31/16 1530       Signed: Anastasio Auerbach. Daniela Siebers   PA-C  11/02/2016, 2:18 PM

## 2016-11-02 NOTE — Progress Notes (Signed)
Occupational Therapy Treatment Patient Details Name: LAVAUN GREENFIELD MRN: [id number] DOB: 1946/03/04 Today's Date: 11/02/2016    History of present illness s/p R TKA   OT comments  Completed tub transfer education  Follow Up Recommendations  Supervision - Intermittent (as much as possible initially)    Equipment Recommendations  None recommended by OT    Recommendations for Other Services      Precautions / Restrictions Precautions Precautions: Knee;Fall Restrictions Weight Bearing Restrictions: No Other Position/Activity Restrictions: WBAT       Mobility Bed Mobility               General bed mobility comments: oob  Transfers   Equipment used: Rolling walker (2 wheeled)   Sit to Stand: Min guard         General transfer comment: for safety.  pt cued herself for UE placement    Balance                                   ADL                           Toilet Transfer: Min guard;Ambulation;RW       Tub/ Shower Transfer: Minimal assistance;Ambulation;Tub bench;Rolling walker     General ADL Comments: practiced tub bench transfer.  Pt does not feel safe performing herself.  Recommended she either sponge bathe or wear gown and have son assist her.  She can hand gown to him around shower curtain.        Vision                     Perception     Praxis      Cognition   Behavior During Therapy: WFL for tasks assessed/performed Overall Cognitive Status: Within Functional Limits for tasks assessed                         Exercises     Shoulder Instructions       General Comments      Pertinent Vitals/ Pain       Pain Score: 4  Pain Location: R knee Pain Descriptors / Indicators: Sore Pain Intervention(s): Limited activity within patient's tolerance;Monitored during session;Premedicated before session;Repositioned;Ice applied  Home Living                                           Prior Functioning/Environment              Frequency           Progress Toward Goals  OT Goals(current goals can now be found in the care plan section)  Progress towards OT goals: Progressing toward goals     Plan      Co-evaluation                 End of Session    OT Visit Diagnosis: Pain Pain - Right/Left: Right Pain - part of body: Knee   Activity Tolerance Patient tolerated treatment well   Patient Left in chair;with call bell/phone within reach;with chair alarm set   Nurse Communication          Time: 5366-4403 OT Time Calculation (min): 17 min  Charges: OT General Charges $  OT Visit: 1 Procedure OT Treatments $Self Care/Home Management : 8-22 mins  Marica OtterMaryellen Stepfon Rawles, OTR/L [phone] 11/02/2016   Gizelle Whetsel 11/02/2016, 10:56 AM

## 2016-11-02 NOTE — Progress Notes (Signed)
  Physical Therapy Treatment Patient Details Name: Katherine CoriaBeverly M Summers MRN: [id number] DOB: 1946/01/13 Today's Date: 11/02/2016    History of Present Illness s/p R TKA    PT Comments    Pt ambulated in hallway and performed LE exercises.  Pt provided with HEP handout and feels ready for d/c home today.  Pt had no further questions.  Follow Up Recommendations  Outpatient PT     Equipment Recommendations  Rolling walker with 5" wheels    Recommendations for Other Services       Precautions / Restrictions Precautions Precautions: Knee;Fall Restrictions Other Position/Activity Restrictions: WBAT    Mobility  Bed Mobility               General bed mobility comments: pt up in recliner on arrival  Transfers Overall transfer level: Needs assistance Equipment used: Rolling walker (2 wheeled) Transfers: Sit to/from Stand Sit to Stand: Min guard         General transfer comment: pt able to recall safe technique  Ambulation/Gait Ambulation/Gait assistance: Min guard Ambulation Distance (Feet): 120 Feet Assistive device: Rolling walker (2 wheeled) Gait Pattern/deviations: Step-to pattern;Decreased stance time - right;Antalgic;Trunk flexed     General Gait Details: verbal cues for RW positioning, posture   Stairs            Wheelchair Mobility    Modified Rankin (Stroke Patients Only)       Balance                                    Cognition Arousal/Alertness: Awake/alert Behavior During Therapy: WFL for tasks assessed/performed Overall Cognitive Status: Within Functional Limits for tasks assessed                      Exercises Total Joint Exercises Ankle Circles/Pumps: AROM;10 reps;Both Quad Sets: AROM;Right;10 reps Short Arc Quad: Right;10 reps;AROM Heel Slides: AAROM;Right;10 reps Hip ABduction/ADduction: AROM;Right;10 reps Straight Leg Raises: AROM;Right;10 reps    General Comments        Pertinent Vitals/Pain  Pain Assessment: 0-10 Pain Score: 4  Pain Location: R knee Pain Descriptors / Indicators: Sore Pain Intervention(s): Limited activity within patient's tolerance;Repositioned;Monitored during session    Home Living                      Prior Function            PT Goals (current goals can now be found in the care plan section) Progress towards PT goals: Progressing toward goals    Frequency    7X/week      PT Plan Current plan remains appropriate    Co-evaluation             End of Session   Activity Tolerance: Patient tolerated treatment well Patient left: with call bell/phone within reach;in chair;with chair alarm set   PT Visit Diagnosis: Other abnormalities of gait and mobility (R26.89);Pain Pain - Right/Left: Right Pain - part of body: Knee     Time: 1054-1110 PT Time Calculation (min) (ACUTE ONLY): 16 min  Charges:  $Therapeutic Exercise: 8-22 mins                    G Codes:       Katherine Summers,Katherine Summers 11/02/2016, 1:31 PM Zenovia JarredKati Deondrea Markos, PT, DPT 11/02/2016 Pager: [phone]

## 2016-11-16 ENCOUNTER — Ambulatory Visit (HOSPITAL_COMMUNITY)
Admission: RE | Admit: 2016-11-16 | Discharge: 2016-11-16 | Disposition: A | Discharge status: Home / Self Care | Payer: Medicare Other | Source: Ambulatory Visit | Attending: Internal Medicine | Admitting: Internal Medicine

## 2016-11-16 ENCOUNTER — Other Ambulatory Visit (HOSPITAL_COMMUNITY): Payer: Self-pay | Admitting: Orthopedic Surgery

## 2016-11-16 DIAGNOSIS — Z96651 Presence of right artificial knee joint: Secondary | ICD-10-CM | POA: Diagnosis present

## 2016-11-16 DIAGNOSIS — R52 Pain, unspecified: Secondary | ICD-10-CM

## 2016-11-16 DIAGNOSIS — M7989 Other specified soft tissue disorders: Secondary | ICD-10-CM | POA: Insufficient documentation

## 2016-11-16 NOTE — Progress Notes (Signed)
*  PRELIMINARY RESULTS* Vascular Ultrasound Lower extremity venous duplex has been completed.  Preliminary findings: No evidence of DVT or baker's cyst.   Farrel DemarkJill Eunice, RDMS, RVT  11/16/2016, 1:48 PM

## 2016-12-06 ENCOUNTER — Non-Acute Institutional Stay (SKILLED_NURSING_FACILITY): Payer: Medicare Other | Admitting: Internal Medicine

## 2016-12-06 ENCOUNTER — Encounter: Payer: Self-pay | Admitting: Internal Medicine

## 2016-12-06 DIAGNOSIS — K5901 Slow transit constipation: Secondary | ICD-10-CM | POA: Diagnosis not present

## 2016-12-06 DIAGNOSIS — J309 Allergic rhinitis, unspecified: Secondary | ICD-10-CM | POA: Diagnosis not present

## 2016-12-06 DIAGNOSIS — E785 Hyperlipidemia, unspecified: Secondary | ICD-10-CM | POA: Diagnosis not present

## 2016-12-06 DIAGNOSIS — F32A Depression, unspecified: Secondary | ICD-10-CM

## 2016-12-06 DIAGNOSIS — R2681 Unsteadiness on feet: Secondary | ICD-10-CM | POA: Diagnosis not present

## 2016-12-06 DIAGNOSIS — F329 Major depressive disorder, single episode, unspecified: Secondary | ICD-10-CM

## 2016-12-06 DIAGNOSIS — I5032 Chronic diastolic (congestive) heart failure: Secondary | ICD-10-CM

## 2016-12-06 DIAGNOSIS — I10 Essential (primary) hypertension: Secondary | ICD-10-CM | POA: Diagnosis not present

## 2016-12-06 DIAGNOSIS — D500 Iron deficiency anemia secondary to blood loss (chronic): Secondary | ICD-10-CM | POA: Diagnosis not present

## 2016-12-06 DIAGNOSIS — M1711 Unilateral primary osteoarthritis, right knee: Secondary | ICD-10-CM | POA: Diagnosis not present

## 2016-12-06 NOTE — Progress Notes (Signed)
This encounter was created in error - please disregard.

## 2016-12-06 NOTE — Progress Notes (Signed)
LOCATION: [facility]  PCP: Gwynneth Aliment, MD   Code Status: Full Code  Goals of care: Advanced Directive information Advanced Directives 10/31/2016  Does Patient Have a Medical Advance Directive? Yes  Type of Estate agent of [place];Living will  Does patient want to make changes to medical advance directive? No - Patient declined  Copy of Healthcare Power of Attorney in Chart? No - copy requested       Extended Emergency Contact Information Primary Emergency Contact: [place], [place] [address] [place] Home Phone: [phone] Mobile Phone: [phone] Relation: Son Secondary Emergency Contact: [place], [place] [address] [place] Home Phone: [phone] Mobile Phone: [phone] Relation: Son   No Known Allergies  Chief Complaint  Patient presents with  . New Admit To SNF    New Admission Visit      HPI:  Patient is a 71 y.o. female seen today for short term rehabilitation post hospital admission from 10/31/2016-11/02/2016 with right knee osteoarthritis. She underwent right total knee arthroplasty and was discharged home. She is here for rehabilitation. She is seen in her room today. She has medical history of hypertension, depression, hyperlipidemia, vitamin D deficiency among others. She does not want to take her iron supplements.   Review of Systems:  Constitutional: Negative for fever, chills, diaphoresis.  Feels weak and tired. HENT: Negative for headache, congestion, nasal discharge, sore throat, difficulty swallowing.   Eyes: Negative for eye pain, blurred vision, double vision and discharge.  Respiratory: Negative for cough, shortness of breath and wheezing.   Cardiovascular: Negative for chest pain, palpitations, leg swelling.  Gastrointestinal: Negative for heartburn, nausea, vomiting, abdominal pain,melena. Positive for poor appetite. Last bowel movement was  yesterday.  Genitourinary: Negative for dysuria.  Musculoskeletal: Negative for back pain, fall in the facility.   pain medication has been helpful. Skin: Negative for itching, rash.  Neurological: Negative for dizziness. Psychiatric/Behavioral: Negative for depression.   Past Medical History:  Diagnosis Date  . Allergy   . COPD (chronic obstructive pulmonary disease) (HCC)   . Depression 08/01/2016  . Essential hypertension 08/01/2016  . Hypertension   . Murmur 08/01/2016  . Pre-diabetes   . Shortness of breath 08/01/2016   Past Surgical History:  Procedure Laterality Date  . COLONOSCOPY    . FOOT SURGERY    . TONSILLECTOMY    . 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 TUBAL LIGATION     Social History:   reports that she quit smoking about 20 years ago. Her smoking use included Cigarettes. She has never used smokeless tobacco. She reports that she does not use drugs. Her alcohol history is not on file.  Family History  Problem Relation Age of Onset  . Stroke Mother   . Colon cancer Father   . Colon cancer Sister   . Stroke Maternal Grandmother   . Colon cancer Paternal Grandfather     Medications: Allergies as of 12/06/2016   No Known Allergies     Medication List       Accurate as of 12/06/16 11:23 AM. Always use your most recent med list.          amLODipine 5 MG tablet Commonly known as:  NORVASC Take 5 mg by mouth daily.   aspirin 81 MG chewable tablet Chew 81 mg by mouth 2 (two) times daily. Stop date 01/03/17   atorvastatin 20 MG tablet Commonly known as:  LIPITOR Take 20 mg by mouth daily.   docusate sodium 100 MG capsule Commonly known as:  COLACE Take 1 capsule (100 mg total) by mouth 2 (two) times daily.   escitalopram 5 MG tablet Commonly known as:  LEXAPRO Take 5 mg by mouth  daily.   ferrous sulfate 325 (65 FE) MG tablet Commonly known as:  FERROUSUL Take 1 tablet (325 mg total) by mouth 3 (three) times daily with meals.   HYDROcodone-acetaminophen 7.5-325 MG tablet Commonly known as:  NORCO Take 1-2 tablets by mouth every 4 (four) hours as needed for moderate pain or severe pain.   lisinopril-hydrochlorothiazide 20-12.5 MG tablet Commonly known as:  PRINZIDE,ZESTORETIC Take 1 tablet by mouth daily.   loratadine 10 MG tablet Commonly known as:  CLARITIN Take 1 tablet by mouth daily.   methocarbamol 500 MG tablet Commonly known as:  ROBAXIN Take 1 tablet (500 mg total) by mouth every 6 (six) hours as needed for muscle spasms.   polyethylene glycol packet Commonly known as:  MIRALAX / GLYCOLAX Take 17 g by mouth 2 (two) times daily.   SPIRIVA RESPIMAT 1.25 MCG/ACT Aers Generic drug:  Tiotropium Bromide Monohydrate Inhale 1 puff into the lungs daily as needed (shortness of breath).   Vitamin D3 2000 units Tabs Take 2,000 Units by mouth daily.       Immunizations: Immunization History  Administered Date(s) Administered  . PPD Test 12/05/2016     Physical Exam: Vitals:   12/06/16 1115  BP: (!) 143/74  Pulse: 80  Resp: 20  Temp: 97.5 F (36.4 C)  TempSrc: Oral  SpO2: 97%  Weight: 183 lb (83 kg)  Height: 5' 5.5" (1.664 m)   Body mass index is 29.99 kg/m.  General- elderly female, Overweight, in no acute distress Head- normocephalic, atraumatic Nose- no maxillary or frontal sinus tenderness, no nasal discharge Throat- moist mucus membrane, normal oropharynx Eyes- PERRLA, EOMI, no pallor, no icterus, no discharge, normal conjunctiva, normal sclera Neck- no cervical lymphadenopathy Cardiovascular- normal s1,s2, no murmur Respiratory- bilateral clear to auscultation, no wheeze, no rhonchi, no crackles, no use of accessory muscles Abdomen- bowel sounds present, soft, non tender, no guarding or rigidity, no CVA  tenderness Musculoskeletal- able to move all 4 extremities, no spinal and paraspinal tenderness, limited right knee range of motion, trace leg edema, arthritis changes to her fingers6 Neurological- alert and oriented to person, place and time Skin- warm and dry, surgical incision to right knee healing well with mild warmth present, no redness, no drainage from the surgical incision site Psychiatry- normal mood and affect    Labs reviewed: Basic Metabolic Panel:  Recent Labs  16/10/96 1130 11/01/16 0435 11/02/16 0413  NA 143 140 145  K 3.6 3.8 3.9  CL 106 109 111  CO2 GLUCOSE 95 145* 78  BUN 14 15 21*  CREATININE 0.60 0.69 0.66  CALCIUM 10.1 8.9 9.2   Liver Function Tests: No results for input(s): AST, ALT, ALKPHOS, BILITOT, PROT, ALBUMIN in the last 8760 hours. No results for input(s): LIPASE, AMYLASE in the last 8760 hours. No results for input(s): AMMONIA in the last 8760 hours. CBC:  Recent Labs  10/25/16 1130 11/01/16 0435 11/02/16 0413  WBC 9.2 10.8* 10.6*  HGB 13.6 11.4* 10.8*  HCT  40.8 34.6* 33.1*  MCV 85.5 85.2 85.8  PLT 303 257 256   Cardiac Enzymes: No results for input(s): CKTOTAL, CKMB, CKMBINDEX, TROPONINI in the last 8760 hours. BNP: Invalid input(s): POCBNP CBG:  Recent Labs  10/31/16 1013  GLUCAP 100*     Assessment/Plan  Unsteady gait With right knee surgery. Will have patient work with PT/OT as tolerated to regain strength and restore function.  Fall precautions are in place.  Right knee osteoarthritis Status post right total knee arthroplasty. Will need follow-up with orthopedic.Will have her work with physical therapy and occupational therapy team to help with gait training and muscle strengthening exercises.fall precautions. Skin care. Encourage to be out of bed. Apply TED hose during daytime to help with her leg edema. Continue aspirin 81 mg twice a day for 4 weeks for DVT prophylaxis. Continue Norco 7. 5-325 milligrams 1-2  tablet every 4 hours as needed for pain and Robaxin 500 mg every 6 hours as needed for muscle spasm. Get PMR consult.  Slow transit constipation Currently on Colace 100 mg twice a day and MiraLAX 17 g twice a day. She had a huge bowel movement yesterday. Will change her MiraLAX to once a day for now to avoid diarrhea/loose stool. Hydration encouraged.   Blood loss anemia Postop. Currently on ferrous sulfate 325 mg 3 times a day with the patient does not want to be on iron supplement. Change this to once a day for now. Check CBC and if hemoglobin above 12 can discontinue iron supplement.   Hypertension Monitor blood pressure reading. Continue lisinopril-hydrochlorothiazide 20-12 0.5 mg daily with amlodipine 5 mg daily and monitor. Check bmp  Chronic depression Mood appears stable. Continue escitalopram 5 mg daily and monitor  Hyperlipidemia Continue Lipitor 20 mg daily  Allergic rhinitis On Claritin on a daily basis. Monitor clinically. Currently free of symptoms.  Diastolic chf Stable, on lisinopril. Continue prn spiriva for dyspnea.    Goals of care: short term rehabilitation   Labs/tests ordered: cbc, bmp 12/07/16   Family/ staff Communication: reviewed care plan with patient and nursing supervisor  I have spent greater than 50 minutes for this encounter which includes reviewing hospital records, addressing above mentioned concerns, reviewing care plan with patient, answering patient's concerns and counseling her.   Oneal Grout, MD Internal Medicine [facility] [address] [place], [place] [address] Cell Phone (Monday-Friday 8 am - 5 pm): [phone] On Call: [phone] and follow prompts after 5 pm and on weekends Office Phone: [phone] Office Fax: [phone]

## 2016-12-07 LAB — CBC AND DIFFERENTIAL
HEMATOCRIT: 37 % (ref 36–46)
Hemoglobin: 12 g/dL (ref 12.0–16.0)
NEUTROS ABS: 4 /uL
Platelets: 310 10*3/uL (ref 150–399)
WBC: 6.8 10^3/mL

## 2016-12-07 LAB — BASIC METABOLIC PANEL
BUN: 14 mg/dL (ref 4–21)
Creatinine: 0.7 mg/dL (ref 0.5–1.1)
GLUCOSE: 93 mg/dL
POTASSIUM: 4 mmol/L (ref 3.4–5.3)
Sodium: 143 mmol/L (ref 137–147)

## 2016-12-21 ENCOUNTER — Non-Acute Institutional Stay (SKILLED_NURSING_FACILITY): Payer: Medicare Other | Admitting: Adult Health

## 2016-12-21 ENCOUNTER — Encounter: Payer: Self-pay | Admitting: Adult Health

## 2016-12-21 DIAGNOSIS — K5901 Slow transit constipation: Secondary | ICD-10-CM | POA: Diagnosis not present

## 2016-12-21 DIAGNOSIS — D500 Iron deficiency anemia secondary to blood loss (chronic): Secondary | ICD-10-CM | POA: Diagnosis not present

## 2016-12-21 DIAGNOSIS — R2681 Unsteadiness on feet: Secondary | ICD-10-CM

## 2016-12-21 DIAGNOSIS — E785 Hyperlipidemia, unspecified: Secondary | ICD-10-CM | POA: Diagnosis not present

## 2016-12-21 DIAGNOSIS — I10 Essential (primary) hypertension: Secondary | ICD-10-CM | POA: Diagnosis not present

## 2016-12-21 DIAGNOSIS — F329 Major depressive disorder, single episode, unspecified: Secondary | ICD-10-CM | POA: Diagnosis not present

## 2016-12-21 DIAGNOSIS — I5032 Chronic diastolic (congestive) heart failure: Secondary | ICD-10-CM | POA: Diagnosis not present

## 2016-12-21 DIAGNOSIS — J309 Allergic rhinitis, unspecified: Secondary | ICD-10-CM

## 2016-12-21 DIAGNOSIS — M1711 Unilateral primary osteoarthritis, right knee: Secondary | ICD-10-CM

## 2016-12-21 DIAGNOSIS — R600 Localized edema: Secondary | ICD-10-CM

## 2016-12-21 DIAGNOSIS — F32A Depression, unspecified: Secondary | ICD-10-CM

## 2016-12-21 NOTE — Progress Notes (Signed)
DATE:  12/21/2016   MRN:  [id number]  BIRTHDAY: June 24, 1946  Facility:  Nursing Home Location:  [facility]  Nursing Home Room Number: 808-P  LEVEL OF CARE:  [phone])  Contact Information    Name Relation Home Work Mobile   Sedley Son [phone]  [phone]   K[place]on, [place] [phone]  [phone]       Code Status History    Date Active Date Inactive Code Status Order ID Comments User Context   10/31/2016  3:30 PM 11/02/2016  3:27 PM Full Code [id number]  Lanney Gins, PA-C Inpatient       Chief Complaint  Patient presents with  . Discharge Note    HISTORY OF PRESENT ILLNESS:  This is a 70-YO female who is for discharge home with Home health PT and OT.  She was seen in the room today and noted to have 2+edema of RLE (ankle area) and site is tender to touch.  She has been admitted to [facility] on 12/05/16 from [facility] hospitalization dates 10/31/16 to 11/02/16 with right knee osteoarthritis for which she had right total knee arthroplasty. She has PMH of hypertension, depression, hyperlipidemia and vitamin D deficiency.  Patient was admitted to this facility for short-term rehabilitation after the patient's recent hospitalization.  Patient has completed SNF rehabilitation and therapy has cleared the patient for discharge.   PAST MEDICAL HISTORY:  Past Medical History:  Diagnosis Date  . Allergy   . COPD (chronic obstructive pulmonary disease) (HCC)   . Depression 08/01/2016  . Essential hypertension 08/01/2016  . Hypertension   . Murmur 08/01/2016  . Pre-diabetes   . Shortness of breath 08/01/2016     CURRENT MEDICATIONS: Reviewed  Patient's Medications  New Prescriptions   No medications on file  Previous Medications   AMLODIPINE (NORVASC) 5 MG TABLET    Take 5 mg by mouth daily.   ASPIRIN 81 MG CHEWABLE TABLET    Chew 81 mg by mouth 2 (two) times daily. Stop date 01/03/17   ATORVASTATIN (LIPITOR)  20 MG TABLET    Take 20 mg by mouth daily.    CHOLECALCIFEROL (VITAMIN D3) 2000 UNITS TABS    Take 2,000 Units by mouth daily.    DOCUSATE SODIUM (COLACE) 100 MG CAPSULE    Take 1 capsule (100 mg total) by mouth 2 (two) times daily.   ESCITALOPRAM (LEXAPRO) 5 MG TABLET    Take 5 mg by mouth daily.   FERROUS SULFATE (FERROUSUL) 325 (65 FE) MG TABLET    Take 1 tablet (325 mg total) by mouth 3 (three) times daily with meals.   HYDROCODONE-ACETAMINOPHEN (NORCO) 7.5-325 MG TABLET    Take 1-2 tablets by mouth every 4 (four) hours as needed for moderate pain or severe pain.   LISINOPRIL-HYDROCHLOROTHIAZIDE (PRINZIDE,ZESTORETIC) 20-12.5 MG PER TABLET    Take 1 tablet by mouth daily.   LORATADINE (CLARITIN) 10 MG TABLET    Take 1 tablet by mouth daily.   METHOCARBAMOL (ROBAXIN) 500 MG TABLET    Take 1 tablet (500 mg total) by mouth every 6 (six) hours as needed for muscle spasms.   POLYETHYLENE GLYCOL (MIRALAX / GLYCOLAX) PACKET    Take 17 g by mouth 2 (two) times daily.   TIOTROPIUM BROMIDE MONOHYDRATE (SPIRIVA RESPIMAT) 1.25 MCG/ACT AERS    Inhale 1 puff into the lungs daily as needed (shortness of breath).   Modified Medications   No medications on file  Discontinued Medications  No medications on file     No Known Allergies   REVIEW OF SYSTEMS:  GENERAL: no change in appetite, no fatigue, no weight changes, no fever, chills or weakness EYES: Denies change in vision, dry eyes, eye pain, itching or discharge EARS: Denies change in hearing, ringing in ears, or earache NOSE: Denies nasal congestion or epistaxis MOUTH and THROAT: Denies oral discomfort, gingival pain or bleeding, pain from teeth or hoarseness   RESPIRATORY: no cough, SOB, DOE, wheezing, hemoptysis CARDIAC: no chest pain or palpitations, +edema GI: no abdominal pain, diarrhea, constipation, heart burn, nausea or vomiting GU: Denies dysuria, frequency, hematuria, incontinence, or discharge PSYCHIATRIC: Denies feeling of  depression or anxiety. No report of hallucinations, insomnia, paranoia, or agitation    PHYSICAL EXAMINATION  GENERAL APPEARANCE: Well nourished. In no acute distress. Normal body habitus SKIN:  Right knee surgical wound is healed.  HEAD: Normal in size and contour. No evidence of trauma EYES: Lids open and close normally. No blepharitis, entropion or ectropion. PERRL. Conjunctivae are clear and sclerae are white. Lenses are without opacity EARS: Pinnae are normal. Patient hears normal voice tunes of the examiner MOUTH and THROAT: Lips are without lesions. Oral mucosa is moist and without lesions. Tongue is normal in shape, size, and color and without lesions NECK: supple, trachea midline, no neck masses, no thyroid tenderness, no thyromegaly LYMPHATICS: no LAN in the neck, no supraclavicular LAN RESPIRATORY: breathing is even & unlabored, BS CTAB CARDIAC: RRR, no murmur,no extra heart sounds, RLE 2+ edema, LLE 1+ edema GI: abdomen soft, normal BS, no masses, no tenderness, no hepatomegaly, no splenomegaly EXTREMITIES:  Able to move X 4 extremities PSYCHIATRIC: Alert and oriented X 3. Affect and behavior are appropriate   LABS/RADIOLOGY: Labs reviewed:  12/07/16   Na 143  K 4.0  Glucose 93  BUN 14  Creatinine 0.68  Calcium 9.6  GFR >60  Wbc 6.8  hgb 12.0  hct 36.7  MCV 88.2  Platelet 310 Basic Metabolic Panel:  Recent Labs  40/98/11 1130 11/01/16 0435 11/02/16 0413  NA 143 140 145  K 3.6 3.8 3.9  CL 106 109 111  CO2 GLUCOSE 95 145* 78  BUN 14 15 21*  CREATININE 0.60 0.69 0.66  CALCIUM 10.1 8.9 9.2   CBC:  Recent Labs  10/25/16 1130 11/01/16 0435 11/02/16 0413  WBC 9.2 10.8* 10.6*  HGB 13.6 11.4* 10.8*  HCT 40.8 34.6* 33.1*  MCV 85.5 85.2 85.8  PLT 303 257 256   CBG:  Recent Labs  10/31/16 1013  GLUCAP 100*       ASSESSMENT/PLAN:  Unsteady gait - for home health PT and OT, for therapeutic strengthening exercises; fall precautions  Right  knee osteoarthritis - S/P right total knee arthroplasty; for home health PT and OT, for therapeutic strengthening exercises; follow-up with orthopedics; continue aspirin 81 mg 1 tab by mouth twice a day for DVT prophylaxis still 01/03/17; Norco 7.5/325 mg 1-2 tabs by mouth every 4 hours when necessary for pain; Robaxin 500 mg 1 tab by mouth every 6 hours when necessary for muscle spasm  Hypertension - well-controlled; continue lisinopril-HCTZ 20-12.5 mg 1 tab by mouth daily, Norvasc 5 mg 1 tab by mouth daily  Depression - mood is stable; continue Lexapro 5 mg 1 tab by mouth daily  Constipation - Continue MiraLAX 17 g by mouth daily, Colace 100 mg 1 capsule by mouth twice a day  Anemia, acute blood loss - stable; discontinue ferrous  sulfate Lab Results  Component Value Date   HGB 12.0 12/07/2016   Hyperlipidemia - continue Lipitor 20 mg 1 tab by mouth daily  Allergic rhinitis - continue Claritin 10 mg 1 tab by mouth daily  Bilateral lower extremity edema - RLE is tender and has 2+ edema; check for venous doppler ultrasound to R/O DVT  Chronic diastolic CHF - no SOB; continue lisinopril-hctz 20-12.5 mg 1 tab PO Q D and Spiriva for dyspnea    I have filled out patient's discharge paperwork and written prescriptions.  Patient will receive home health PT and OT.  DME provided:  None  Total discharge time: Greater than 30 minutes Greater than 50% was spent in counseling and coordination of care with the patient.   Discharge time involved coordination of the discharge process with social worker, nursing staff and therapy department. Medical justification for home health services verified.    Jaydee Ingman C. Medina-Vargas - NP    [employer] [phone]

## 2017-04-23 ENCOUNTER — Other Ambulatory Visit: Payer: Self-pay | Admitting: Adult Health

## 2017-05-23 ENCOUNTER — Other Ambulatory Visit: Payer: Self-pay | Admitting: Adult Health

## 2017-05-25 ENCOUNTER — Other Ambulatory Visit: Payer: Self-pay | Admitting: Adult Health

## 2018-01-04 DIAGNOSIS — M25561 Pain in right knee: Secondary | ICD-10-CM | POA: Insufficient documentation

## 2018-01-16 ENCOUNTER — Other Ambulatory Visit: Payer: Self-pay | Admitting: Internal Medicine

## 2018-01-16 ENCOUNTER — Ambulatory Visit
Admission: RE | Admit: 2018-01-16 | Discharge: 2018-01-16 | Disposition: A | Discharge status: Home / Self Care | Payer: Medicare Other | Source: Ambulatory Visit | Attending: Internal Medicine | Admitting: Internal Medicine

## 2018-01-16 DIAGNOSIS — R05 Cough: Secondary | ICD-10-CM

## 2018-01-16 DIAGNOSIS — R059 Cough, unspecified: Secondary | ICD-10-CM

## 2018-02-24 ENCOUNTER — Encounter (HOSPITAL_BASED_OUTPATIENT_CLINIC_OR_DEPARTMENT_OTHER): Payer: Self-pay

## 2018-02-24 ENCOUNTER — Emergency Department (HOSPITAL_BASED_OUTPATIENT_CLINIC_OR_DEPARTMENT_OTHER)
Admission: EM | Admit: 2018-02-24 | Discharge: 2018-02-24 | Disposition: A | Discharge status: Home / Self Care | Payer: Medicare Other | Attending: Emergency Medicine | Admitting: Emergency Medicine

## 2018-02-24 ENCOUNTER — Other Ambulatory Visit: Payer: Self-pay

## 2018-02-24 DIAGNOSIS — Z87891 Personal history of nicotine dependence: Secondary | ICD-10-CM | POA: Diagnosis not present

## 2018-02-24 DIAGNOSIS — Z96651 Presence of right artificial knee joint: Secondary | ICD-10-CM | POA: Insufficient documentation

## 2018-02-24 DIAGNOSIS — Z7982 Long term (current) use of aspirin: Secondary | ICD-10-CM | POA: Insufficient documentation

## 2018-02-24 DIAGNOSIS — R350 Frequency of micturition: Secondary | ICD-10-CM | POA: Diagnosis present

## 2018-02-24 DIAGNOSIS — J449 Chronic obstructive pulmonary disease, unspecified: Secondary | ICD-10-CM | POA: Diagnosis not present

## 2018-02-24 DIAGNOSIS — R195 Other fecal abnormalities: Secondary | ICD-10-CM | POA: Insufficient documentation

## 2018-02-24 DIAGNOSIS — Z79899 Other long term (current) drug therapy: Secondary | ICD-10-CM | POA: Diagnosis not present

## 2018-02-24 DIAGNOSIS — I10 Essential (primary) hypertension: Secondary | ICD-10-CM | POA: Insufficient documentation

## 2018-02-24 LAB — CBC WITH DIFFERENTIAL/PLATELET
Basophils Absolute: 0 10*3/uL (ref 0.0–0.1)
Basophils Relative: 0 %
Eosinophils Absolute: 0.1 10*3/uL (ref 0.0–0.7)
Eosinophils Relative: 1 %
HCT: 39.8 % (ref 36.0–46.0)
Hemoglobin: 13.7 g/dL (ref 12.0–15.0)
Lymphocytes Relative: 22 %
Lymphs Abs: 1.8 10*3/uL (ref 0.7–4.0)
MCH: 29.3 pg (ref 26.0–34.0)
MCHC: 34.4 g/dL (ref 30.0–36.0)
MCV: 85.2 fL (ref 78.0–100.0)
Monocytes Absolute: 0.6 10*3/uL (ref 0.1–1.0)
Monocytes Relative: 8 %
Neutro Abs: 5.6 10*3/uL (ref 1.7–7.7)
Neutrophils Relative %: 69 %
Platelets: 259 10*3/uL (ref 150–400)
RBC: 4.67 MIL/uL (ref 3.87–5.11)
RDW: 13.9 % (ref 11.5–15.5)
WBC: 8.2 10*3/uL (ref 4.0–10.5)

## 2018-02-24 LAB — COMPREHENSIVE METABOLIC PANEL
ALT: 25 U/L (ref 14–54)
AST: 30 U/L (ref 15–41)
Albumin: 3.7 g/dL (ref 3.5–5.0)
Alkaline Phosphatase: 110 U/L (ref 38–126)
Anion gap: 8 (ref 5–15)
BUN: 16 mg/dL (ref 6–20)
CO2: 27 mmol/L (ref 22–32)
Calcium: 9.3 mg/dL (ref 8.9–10.3)
Chloride: 107 mmol/L (ref 101–111)
Creatinine, Ser: 0.77 mg/dL (ref 0.44–1.00)
GFR calc Af Amer: >60 mL/min (ref >60)
GFR calc non Af Amer: >60 mL/min (ref >60)
Glucose, Bld: 90 mg/dL (ref 65–99)
Potassium: 3.5 mmol/L (ref 3.5–5.1)
Sodium: 142 mmol/L (ref 135–145)
Total Bilirubin: 0.4 mg/dL (ref 0.3–1.2)
Total Protein: 6.9 g/dL (ref 6.5–8.1)

## 2018-02-24 LAB — URINALYSIS, ROUTINE W REFLEX MICROSCOPIC
Bilirubin Urine: NEGATIVE
Glucose, UA: NEGATIVE mg/dL
Ketones, ur: NEGATIVE mg/dL
LEUKOCYTES UA: NEGATIVE
NITRITE: NEGATIVE
Protein, ur: NEGATIVE mg/dL
SPECIFIC GRAVITY, URINE: 1.015 (ref 1.005–1.030)
pH: 7 (ref 5.0–8.0)

## 2018-02-24 LAB — LIPASE, BLOOD: Lipase: 37 U/L (ref 11–51)

## 2018-02-24 LAB — URINALYSIS, MICROSCOPIC (REFLEX)

## 2018-02-24 NOTE — ED Notes (Signed)
ED Provider at bedside.Dr Mesner  

## 2018-02-24 NOTE — ED Triage Notes (Addendum)
Pt reports she has been having trouble with her bladder for months and also some diarrhea. Pt reports she goes to the restroom at night frequently. Pt reports she was seen by her PCP last week and her urine was checked but she was not told the results.

## 2018-02-24 NOTE — ED Notes (Signed)
Pt states she has frequent urination at night. Also reports she has frequent bowel movements after eating, not diarrhea. States she has had several close friends/family members dx with pancreatic cancer and she is concerned

## 2018-02-24 NOTE — Discharge Instructions (Signed)
Increase your fiber.  Read the handout below.  You may want to try taking your HCTZ in the morning to help prevent urinary frequency later in the day.  Please follow-up with your doctor for further management of your symptoms.  Please return to the emergency department if you develop any new or worsening symptoms.

## 2018-02-24 NOTE — ED Provider Notes (Signed)
Medical screening examination/treatment/procedure(s) were conducted as a shared visit with non-physician practitioner(s) and myself.  I personally evaluated the patient during the encounter.  72 year old female with multiple medical problems however is generally healthy who comes in with a week's worth of looser than normal stools and increased urinary frequency most worrisome at night and in the afternoon. Sam her abdomen is benign.  She appears well.  Vital signs are within normal limits. Labs are unremarkable. She is worried that she has pancreatic cancer.  I reassured her that I thought it was unlikely without weight loss, elevated bilirubin, elevated lipase, jaundice or some other signs.  I did discuss significant entirely ruled out but she did not have any symptoms that indicated imaging at this time.  I discussed may be taken her diuretic limit earlier in the morning to reduce the night time diuresis.  Also discussed with her increasing fiber intake [place] through vegetables and fruit or some other supplement.  Also continue follow-up with Dr. for further management.   Marily MemosMesner, Ruston Fedora, MD 02/24/18 [phone]

## 2018-02-24 NOTE — ED Provider Notes (Signed)
[facility] EMERGENCY DEPARTMENT Provider Note   CSN: [id number] Arrival date & time: 02/24/18  1344     History   Chief Complaint Chief Complaint  Patient presents with  . Urinary Frequency  . Diarrhea    HPI Katherine Summers is a 72 y.o. female with history of hypertension, COPD who presents with a 6+ month history of urinary frequency and looser stools.  She reports that she has to go the bathroom 10 to 15 minutes after eating.  She denies any dysuria, back pain, abdominal pain, nausea, vomiting.  She is concerned because she has had several close runs in family members diagnosed with pancreatic cancer.  She denies any fatigue or appetite change, fevers, chest pain, shortness of breath.  She reports seeing her PCP last week and her urine was checked, but she was not told her results.  I cannot access these results with chart review.  HPI  Past Medical History:  Diagnosis Date  . Allergy   . COPD (chronic obstructive pulmonary disease) (HCC)   . Depression 08/01/2016  . Essential hypertension 08/01/2016  . Hypertension   . Murmur 08/01/2016  . Pre-diabetes   . Shortness of breath 08/01/2016    Patient Active Problem List   Diagnosis Date Noted  . Overweight (BMI 25.0-29.9) 11/01/2016  . 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 Murmur 08/01/2016  . Shortness of breath 08/01/2016  . Abnormal EKG 08/01/2016  . Essential hypertension 08/01/2016  . Depression 08/01/2016    Past Surgical History:  Procedure Laterality Date  . COLONOSCOPY    . FOOT SURGERY    . TONSILLECTOMY    . 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 TUBAL LIGATION       OB History   None      Home Medications    Prior to Admission medications   Medication Sig Start Date End Date Taking? Authorizing Provider  amLODipine (NORVASC) 5 MG  tablet Take 5 mg by mouth daily. 10/28/14   [provider]  aspirin 81 MG chewable tablet Chew 81 mg by mouth 2 (two) times daily. Stop date 01/03/17    [provider]  atorvastatin (LIPITOR) 20 MG tablet Take 20 mg by mouth daily.  10/29/14   [provider]  Cholecalciferol (VITAMIN D3) 2000 units TABS Take 2,000 Units by mouth daily.     [provider]  escitalopram (LEXAPRO) 5 MG tablet Take 5 mg by mouth daily. 10/29/14   [provider]  lisinopril-hydrochlorothiazide (PRINZIDE,ZESTORETIC) 20-12.5 MG per tablet Take 1 tablet by mouth daily. 10/28/14   [provider]  loratadine (CLARITIN) 10 MG tablet Take 1 tablet by mouth daily. 07/08/16   [provider]  methocarbamol (ROBAXIN) 500 MG tablet Take 1 tablet (500 mg total) by mouth every 6 (six) hours as needed for muscle spasms. 10/31/16   Lanney Gins, PA-C  Tiotropium Bromide Monohydrate (SPIRIVA RESPIMAT) 1.25 MCG/ACT AERS Inhale 1 puff into the lungs daily as needed (shortness of breath).     [provider]    Family History Family History  Problem Relation Age of Onset  . Stroke Mother   . Colon cancer Father   . Colon cancer Sister   . Stroke Maternal Grandmother   . Colon cancer Paternal Grandfather     Social History Social History  Tobacco Use  . Smoking status: Former Smoker    Types: Cigarettes    Last attempt to quit: 1998    Years since quitting: 21.4  . Smokeless tobacco: Never Used  Substance Use Topics  . Alcohol use: Not on file  . Drug use: No     Allergies   Patient has no known allergies.   Review of Systems Review of Systems  Constitutional: Negative for chills and fever.  HENT: Negative for facial swelling and sore throat.   Respiratory: Negative for shortness of breath.   Cardiovascular: Negative for chest pain.  Gastrointestinal: Negative for abdominal pain, blood in stool, diarrhea (looser than normal stools), nausea  and vomiting.  Genitourinary: Positive for frequency. Negative for dysuria.  Musculoskeletal: Negative for back pain.  Skin: Negative for rash and wound.  Neurological: Negative for headaches.  Psychiatric/Behavioral: The patient is not nervous/anxious.      Physical Exam Updated Vital Signs BP (!) 155/85 (BP Location: Right Arm)   Pulse 75   Temp 99.3 F (37.4 C) (Oral)   Resp 20   Ht 5\' 5"  (1.651 m)   Wt 84.8 kg (186 lb 14.4 oz)   SpO2 99%   BMI 31.10 kg/m   Physical Exam  Constitutional: She appears well-developed and well-nourished. No distress.  HENT:  Head: Normocephalic and atraumatic.  Mouth/Throat: Oropharynx is clear and moist. No oropharyngeal exudate.  Eyes: Pupils are equal, round, and reactive to light. Conjunctivae are normal. Right eye exhibits no discharge. Left eye exhibits no discharge. No scleral icterus.  Neck: Normal range of motion. Neck supple. No thyromegaly present.  Cardiovascular: Normal rate, regular rhythm, normal heart sounds and intact distal pulses. Exam reveals no gallop and no friction rub.  No murmur heard. Pulmonary/Chest: Effort normal and breath sounds normal. No stridor. No respiratory distress. She has no wheezes. She has no rales.  Abdominal: Soft. Bowel sounds are normal. She exhibits no distension. There is no tenderness. There is no rebound, no guarding and no CVA tenderness.  Musculoskeletal: She exhibits no edema.  Lymphadenopathy:    She has no cervical adenopathy.  Neurological: She is alert. Coordination normal.  Skin: Skin is warm and dry. No rash noted. She is not diaphoretic. No pallor.  Psychiatric: She has a normal mood and affect.  Nursing note and vitals reviewed.    ED Treatments / Results  Labs (all labs ordered are listed, but only abnormal results are displayed) Labs Reviewed  URINALYSIS, ROUTINE W REFLEX MICROSCOPIC - Abnormal; Notable for the following components:      Result Value   APPearance HAZY (*)      Hgb urine dipstick TRACE (*)    All other components within normal limits  URINALYSIS, MICROSCOPIC (REFLEX) - Abnormal; Notable for the following components:   Bacteria, UA MANY (*)    All other components within normal limits  URINE CULTURE  COMPREHENSIVE METABOLIC PANEL  LIPASE, BLOOD  CBC WITH DIFFERENTIAL/PLATELET    EKG None  Radiology No results found.  Procedures Procedures (including critical care time)  Medications Ordered in ED Medications - No data to display   Initial Impression / Assessment and Plan / ED Course  I have reviewed the triage vital signs and the nursing notes.  Pertinent labs & imaging results that were available during my care of the patient were reviewed by me and considered in my medical decision making (see chart for details).     Patient presenting with 6 months history of urinary  frequency and looser stools than normal.  She denies any abdominal pain, nausea, vomiting, fevers, weight loss.  CBC, CMP, lipase completely unremarkable today.  UA shows trace bacteria, many bacteria.  Will send for culture.  No abdominal tenderness on exam.  Patient is concerned no weight loss, early satiety, elevated bilirubin.  Low suspicion at this time, patient advised further management.  No indication for emergent imaging today.  Strict return precautions given.  Patient also evaluated by Dr. Clayborne Dana who guided the patient's management, counseled the patient, and agrees with plan.  Patient vitals stable throughout ED course and discharged in satisfactory condition.  Final Clinical Impressions(s) / ED Diagnoses   Final diagnoses:  Urinary frequency  Loose stools    ED Discharge Orders    None       Emi Holes, PA-C 02/24/18 1552    Mesner, Barbara Cower, MD 02/24/18 1954

## 2018-02-26 LAB — URINE CULTURE

## 2018-03-20 ENCOUNTER — Encounter: Payer: Self-pay | Admitting: Neurology

## 2018-03-21 ENCOUNTER — Encounter: Payer: Self-pay | Admitting: Neurology

## 2018-03-21 ENCOUNTER — Ambulatory Visit (INDEPENDENT_AMBULATORY_CARE_PROVIDER_SITE_OTHER): Payer: Medicare Other | Admitting: Neurology

## 2018-03-21 VITALS — BP 120/71 | HR 64 | Ht 65.0 in | Wt 186.0 lb

## 2018-03-21 DIAGNOSIS — F515 Nightmare disorder: Secondary | ICD-10-CM

## 2018-03-21 DIAGNOSIS — G4752 REM sleep behavior disorder: Secondary | ICD-10-CM | POA: Insufficient documentation

## 2018-03-21 DIAGNOSIS — R351 Nocturia: Secondary | ICD-10-CM

## 2018-03-21 DIAGNOSIS — R5383 Other fatigue: Secondary | ICD-10-CM | POA: Diagnosis not present

## 2018-03-21 DIAGNOSIS — J449 Chronic obstructive pulmonary disease, unspecified: Secondary | ICD-10-CM | POA: Diagnosis not present

## 2018-03-21 DIAGNOSIS — R0683 Snoring: Secondary | ICD-10-CM | POA: Diagnosis not present

## 2018-03-21 MED ORDER — MELATONIN 2.5 MG PO CAPS: ORAL_CAPSULE | ORAL | 0 refills | Status: DC

## 2018-03-21 NOTE — Progress Notes (Signed)
SLEEP MEDICINE CLINIC   Provider:  Larey Seat, M D  Primary Care Physician:  Glendale Chard, MD   Referring Provider: Glendale Chard, MD   Chief Complaint  Patient presents with  . New Patient (Initial Visit)    pt alone, rm 10. pt is having difficulties with going to the bathroom every couple hours during the night. pt has been told she snores. pt states her son has been diagnosed with sleep apnea.     HPI:  Katherine Summers is a 72 y.o. female , seen here in a referral from Dr. Baird Cancer for evaluation of nocturia.  I have the pleasure of meeting with Katherine Summers today, a 72 year old female patient of Dr. Baird Cancer that looks very familiar to me but we could not find out  where we have met. Katherine Summers reports that she had bilateral knee replacements but that her gait has been limited her ambulation is limited by pain, she is using a cane and altogether she feels that the knee replacements have not changed her symptoms to the better in comparison to how she felt before.  She used to sleep well through the night, and never had sleep problems per se.  By a winter 2018 however she began having very frequent bathroom breaks at night which fragmented her sleep.  She was placed on oxybutynin but this has not helped the frequency.  Her primary care physician is therefore concerned that it may be related to obstructive sleep apnea.  I would like to add that the patient also is on hydrochlorothiazide a very low dose 12.5 mg to be taken in the morning each day.  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  She continues with her meds and is compliant, she has not had any recent additional changes in her medications she is using Spiriva, oxybutynin, lisinopril-hydrochlorothiazide, escitalopram-Lexapro 10 mg atorvastatin 20 mg and amlodipine 5 mg tablets.  While she does report that her sleep is no longer as restorative and  refreshing as it used to be she feels rather fatigued and excessively sleepy.  Her Epworth sleepiness score was endorsed at seven-point her fatigue severity score at 58 points, the geriatric depression score at 5 out of 15 points.  Overall coughing, wheezing, snoring and shortness of breath as well as increased thirst have affected her sleepiness probably as much as incontinence and nocturia.     Sleep habits are as follows: The patient reports that her bedtime is usually around 11:30 PM after she works the late night meals on TV.  She does not have difficulties falling asleep she just cannot stay asleep follow-up.  Is an hour she has her first bathroom break and from there on hourly.  Sometimes every 2, so she may have up to 5 bathroom breaks at night. The patient's bedroom is described as cool, quiet and dark.  Conducive to sleep.  She sleeps alone.  She sleeps supine or on either side.  She cannot sleep prone she supports her knees with a pillow at night.  Before she had knee pain she did not used to sleep supine.  She sleeps with 2 pillows for head support, her bed is not adjusted. She reports that when she gets up very quickly she is lightheaded and dizzy. Sometimes she has vertigo, and she has learned to take a couple of seconds at a deep breath before she initiates movement.She denies waking up with palpitations,  diaphoresis, headaches or chest pain. She cannot remember the last time she slept well.   Sleep medical history and family sleep history: son has sleep apnea. She has COPD.   Social history:  Widowed, 2 children- adult sons. Non smoker- quit in 1990. 7 pack years  COPD. Rare ETOH 2 a year.  Caffeine use - 2 cups of coffee a week, pepsi 5 a week, mostly water. Iced tea- when eating out.    Review of Systems: Out of a complete 14 system review, the patient complains of only the following symptoms, and all other reviewed systems are negative.Nocturia/  Isomnia, joint pain, coughing,  gasping. Mind racing.   Epworth score 7/ 24   , Fatigue severity score 58/ 63   , depression score    Social History   Socioeconomic History  . Marital status: Single    Spouse name: Not on file  . Number of children: Not on file  . Years of education: Not on file  . Highest education level: Not on file  Occupational History  . Not on file  Social Needs  . Financial resource strain: Not on file  . Food insecurity:    Worry: Not on file    Inability: Not on file  . Transportation needs:    Medical: Not on file    Non-medical: Not on file  Tobacco Use  . Smoking status: Former Smoker    Types: Cigarettes    Last attempt to quit: 1998    Years since quitting: 21.5  . Smokeless tobacco: Never Used  Substance and Sexual Activity  . Alcohol use: Not on file  . Drug use: No  . Sexual activity: Not on file  Lifestyle  . Physical activity:    Days per week: Not on file    Minutes per session: Not on file  . Stress: Not on file  Relationships  . Social connections:    Talks on phone: Not on file    Gets together: Not on file    Attends religious service: Not on file    Active member of club or organization: Not on file    Attends meetings of clubs or organizations: Not on file    Relationship status: Not on file  . Intimate partner violence:    Fear of current or ex partner: Not on file    Emotionally abused: Not on file    Physically abused: Not on file    Forced sexual activity: Not on file  Other Topics Concern  . Not on file  Social History Narrative  . Not on file    Family History  Problem Relation Age of Onset  . Stroke Mother   . Colon cancer Father   . Colon cancer Sister   . Stroke Maternal Grandmother   . Colon cancer Paternal Grandfather     Past Medical History:  Diagnosis Date  . Allergy   . CKD (chronic kidney disease) stage 2, GFR 60-89 ml/min   . COPD (chronic obstructive pulmonary disease) ([employer])   . Depression 08/01/2016  . Dizziness and  giddiness   . Essential hypertension 08/01/2016  . Hyperlipidemia   . Hypertension   . Murmur 08/01/2016  . Pre-diabetes   . Shortness of breath 08/01/2016  . Vitamin D deficiency     Past Surgical History:  Procedure Laterality Date  . COLONOSCOPY    . FOOT SURGERY    . TONSILLECTOMY    . TOTAL KNEE ARTHROPLASTY Right 10/31/2016  Procedure: RIGHT TOTAL KNEE ARTHROPLASTY;  Surgeon: Paralee Cancel, MD;  Location: [facility];  Service: Orthopedics;  Laterality: Right;  Requesting for 70 mins  . TUBAL LIGATION      Current Outpatient Medications  Medication Sig Dispense Refill  . amLODipine (NORVASC) 5 MG tablet Take 5 mg by mouth daily.  5  . atorvastatin (LIPITOR) 20 MG tablet Take 20 mg by mouth daily.   3  . escitalopram (LEXAPRO) 5 MG tablet Take 5 mg by mouth daily.  5  . lisinopril-hydrochlorothiazide (PRINZIDE,ZESTORETIC) 20-12.5 MG per tablet Take 1 tablet by mouth daily.  5  . naproxen (NAPROSYN) 500 MG tablet Take 1 tablet by mouth every 12 (twelve) hours as needed.    Marland Kitchen oxybutynin (DITROPAN-XL) 10 MG 24 hr tablet TK 1 T PO QD  1  . tiotropium (SPIRIVA HANDIHALER) 18 MCG inhalation capsule Spiriva with HandiHaler 18 mcg and inhalation capsules     No current facility-administered medications for this visit.     Allergies as of 03/21/2018  . (No Known Allergies)    Vitals: BP 120/71   Pulse 64   Ht '5\' 5"'$  (1.651 m)   Wt 186 lb (84.4 kg)   BMI 30.95 kg/m  Last Weight:  Wt Readings from Last 1 Encounters:  03/21/18 186 lb (84.4 kg)   DJM:EQAS mass index is 30.95 kg/m.     Last Height:   Ht Readings from Last 1 Encounters:  03/21/18 '5\' 5"'$  (1.651 m)    Physical exam:  General: The patient is awake, alert and appears not in acute distress. The patient is well groomed. Head: Normocephalic, atraumatic. Neck is supple. Mallampati 3,  neck circumference:15. 5" . Nasal airflow a rarely patent - she is a mouth breather  ,. Retrognathia is mild . Cardiovascular:   Regular rate and rhythm , with a mitral murmur, no carotid bruit, and without distended neck veins. Respiratory: Lungs with rhonchi , wheezing to auscultation. Skin:  Without evidence of edema, or rash Trunk: BMI is 31 .  The patient's posture is stooped.   Neurologic exam : The patient is awake and alert, oriented to place and time. Attention span & concentration ability appears normal.  Speech is fluent,  without  dysarthria, dysphonia or aphasia.  Mood and affect are appropriate.  Cranial nerves: Pupils are equal and briskly reactive to light. Funduscopic exam without  evidence of pallor or edema. No cataracts. Extraocular movements  in vertical and horizontal planes intact and without nystagmus.  Visual fields by finger perimetry are intact. Hearing to finger rub intact.  Facial sensation intact to fine touch. Facial motor strength is symmetric and tongue and uvula move midline. Shoulder shrug was symmetrical.  Motor exam: ROM of both knees is limited. Upper extremities have full ROM.  Diffusely elevated tone, muscle bulk and symmetric strength in upper extremities, but weak grip- she has a stronger pinch than grip strength. Equal shoulder shrug.  Sensory:  Fine touch, pinprick and vibration were tested in all extremities. Proprioception tested in the upper extremities was normal. Coordination: Rapid alternating movements in the fingers/hands was normal.  Finger-to-nose maneuver normal without evidence of ataxia, dysmetria but she has a mild resting tremor, most dominant over the right. . Gait and station: Patient walks with a cane and has a slowed gait, slightly shuffling, stooped.  Needs assistive device . Deep tendon reflexes: in the  upper and lower extremities are symmetric and intact.  Assessment:  After physical and neurologic examination, review of  laboratory studies,  Personal review of imaging studies, reports of other /same  Imaging studies, results of polysomnography and / or  neurophysiology testing and pre-existing records as far as provided in visit., my assessment is   1)  Diffuse tone elevation, mild resting tremor on the dominant side, shuffling and history of falls. - will follow and screen for PD /   2)  Nocturia- 4-5 times , takes HCTZ in AM.  Snoring according to granddaughter, who witnessed apnea, too. She sleep talks.   3) Reports delayed memory recall, will need MOCA with next appointment.   4) vivid dreams, often continues the same dream content over many cycles.  Woke up screaming out of her dreams- but cannot recall the content. Need to keep in mind that she could develop REM BD ,Lewy body dementia / Parkinsonisms.   The patient was advised of the nature of the diagnosed disorder , the treatment options and the  risks for general health and wellness arising from not treating the condition.  I spent more than 45 minutes of face to face time with the patient. Greater than 50% of time was spent in counseling and coordination of care. We have discussed the diagnosis and differential and I answered the patient's questions.    Plan:  Treatment plan and additional workup :  REM BD polysomnography= patient has COPD , is prone to hypoxemia , could have hypercapnia.  Nocturia is very frequent and can be related to OSA, cardiac dysfunction.   Larey Seat, MD 05/28/2682, 4:19 PM  Certified in Neurology by ABPN Certified in [place] by [facility] [address] [place], [place] [address]

## 2018-03-21 NOTE — Patient Instructions (Signed)

## 2018-03-21 NOTE — Addendum Note (Signed)
Addended by: Melvyn NovasHMEIER, Jackye Dever on: 03/21/2018 01:59 PM   Modules accepted: Orders

## 2018-04-07 ENCOUNTER — Ambulatory Visit (INDEPENDENT_AMBULATORY_CARE_PROVIDER_SITE_OTHER): Payer: Medicare Other | Admitting: Neurology

## 2018-04-07 DIAGNOSIS — G4733 Obstructive sleep apnea (adult) (pediatric): Secondary | ICD-10-CM | POA: Diagnosis not present

## 2018-04-07 DIAGNOSIS — R0683 Snoring: Secondary | ICD-10-CM

## 2018-04-07 DIAGNOSIS — F515 Nightmare disorder: Secondary | ICD-10-CM

## 2018-04-07 DIAGNOSIS — J449 Chronic obstructive pulmonary disease, unspecified: Secondary | ICD-10-CM

## 2018-04-07 DIAGNOSIS — G4752 REM sleep behavior disorder: Secondary | ICD-10-CM

## 2018-04-07 DIAGNOSIS — R351 Nocturia: Secondary | ICD-10-CM

## 2018-04-07 DIAGNOSIS — R5383 Other fatigue: Secondary | ICD-10-CM

## 2018-04-09 NOTE — Addendum Note (Signed)
Addended by: Melvyn NovasHMEIER, Arlethia Basso on: 04/09/2018 05:15 PM   Modules accepted: Orders

## 2018-04-09 NOTE — Procedures (Signed)
PATIENT'S NAME:  Katherine Summers, Katherine Summers DOB:      Dec 21, 1945      MR#:    [id number]     DATE OF RECORDING: 04/07/2018 REFERRING Summers.D.:  Dorothyann Pengobyn Sanders, MD Study Performed:   Baseline Polysomnogram HISTORY:  Katherine Summers is a 72 year old female patient, who reports that she had bilateral knee replacements -she feels that knee pain affected her sleep. She often has wheezing, SOB, rhinitis, and feels depressed. In 12/ 2018 she began having very frequent bathroom breaks at night which fragmented her sleep.  She was placed on oxybutynin but this has not helped the frequency.  Her primary care physician is therefore concerned that it may be related to obstructive sleep apnea.  Taking hydrochlorothiazide at a very low dose, 12.5 mg in the morning.  Hypertension has been rather well controlled, the patient has reached chronic kidney disease stage II, further she is using Spiriva, oxybutynin, Lisinopril-hydrochlorothiazide, Escitalopram-Lexapro 10 mg atorvastatin 20 mg and amlodipine 5 mg tablets.  The patient endorsed the Epworth Sleepiness Scale at 7/24 points.   The patient's weight 186 pounds with a height of 65 (inches), resulting in a BMI of 30.9 kg/m2. The patient's neck circumference measured 15.5 inches.  CURRENT MEDICATIONS above   PROCEDURE:  This is a multichannel digital polysomnogram utilizing the Somnostar 11.2 system.  Electrodes and sensors were applied and monitored per AASM Specifications.   EEG, EOG, Chin and Limb EMG, were sampled at 200 Hz.  ECG, Snore and Nasal Pressure, Thermal Airflow, Respiratory Effort, CPAP Flow and Pressure, Oximetry was sampled at 50 Hz. Digital video and audio were recorded.      BASELINE STUDY: Lights Out was at 21:30 and Lights On at 04:47.  Total recording time (TRT) was 437.5 minutes, with a total sleep time (TST) of 366 minutes. The patient's sleep latency was 18 minutes.  REM latency was 0 minutes. The sleep efficiency was 83.7 %.     SLEEP ARCHITECTURE: WASO (Wake  after sleep onset) was 53.5 minutes.  There were 18 minutes in Stage N1, 348 minutes Stage N2, 0 minutes Stage N3 and 0 minutes in Stage REM.  The percentage of Stage N1 was 4.9%, Stage N2 was 95.1%, Stage N3 was 0% and Stage R (REM sleep) was 0%.   RESPIRATORY ANALYSIS:  There were a total of 168 respiratory events:  2 obstructive apneas, 0 central apneas and 0 mixed apneas with 166 hypopneas. The patient also had 0 respiratory event related arousals (RERAs). The total APNEA/HYPOPNEA INDEX (AHI) was 27.5/hour and the total RESPIRATORY DISTURBANCE INDEX was 27.5 /hour.  There was no REM sleep and all 332 events were in NREM. The patient spent 235.5 minutes of total sleep time in the supine position and 131 minutes in non-supine. The supine AHI was 30.6/h versus a non-supine sleep AHI of 22.1.  OXYGEN SATURATION & C02:  The Wake baseline 02 saturation was 92%, with the lowest being 84%. Time spent below 89% saturation equaled 9 minutes.  Average End Tidal CO2 during sleep was 39.4 torr.  Peak End Tidal CO2 during NREM sleep was 42.4 torr.  Total sleep time with hypercapnia greater than 40 torr was 34.60 minutes, greater than 50 torr was 0.0 minutes.   PERIODIC LIMB MOVEMENTS:  The patient had a total of 0 Periodic Limb Movements.  The arousals were noted as: 122 were spontaneous, 0 were associated with PLMs, and 168 were associated with respiratory events. Audio and video analysis did not show any  abnormal or unusual movements, behaviors, phonations or vocalizations. Nocturia times 2.  Mild Snoring was noted, position dependent. EKG was in keeping with normal sinus rhythm (NSR).  Post-study, the patient indicated that sleep was better than usual. The patient took melatonin prior to the sleep study.    IMPRESSION:  1. Moderate severe Obstructive Sleep Apnea (OSA) at AHI of 27.5/h with some positional accentuation in supine sleep to 30.6/h. REM sleep was not reached.  2. Neither hypoxemia nor  hypercapnia of clinical significance. 3. No Periodic Limb Movement Disorder (PLMD) but many spontaneous arousals that could be related to pain. 4. Mild Primary Snoring in supine.  RECOMMENDATIONS:  1. Advise full-night, attended, CPAP titration study to optimize therapy.     I certify that I have reviewed the entire raw data recording prior to the issuance of this report in accordance with the Standards of Accreditation of the American Academy of Sleep Medicine (AASM)    Melvyn Novas, MD   04-09-2018  Diplomat, American Board of Psychiatry and Neurology  Diplomat, American Board of Sleep Medicine Medical Director, [employer] [employer]

## 2018-04-11 ENCOUNTER — Telehealth: Payer: Self-pay

## 2018-04-11 NOTE — Telephone Encounter (Signed)
Called patient to schedule CPAP titration study.  Went over results of baseline sleep study. Pt understood and had no questions at this time.  Pt is scheduled for CPAP study on 8/20 @ 8pm

## 2018-04-23 ENCOUNTER — Ambulatory Visit (INDEPENDENT_AMBULATORY_CARE_PROVIDER_SITE_OTHER): Payer: Medicare Other | Admitting: Neurology

## 2018-04-23 DIAGNOSIS — G4733 Obstructive sleep apnea (adult) (pediatric): Secondary | ICD-10-CM | POA: Diagnosis not present

## 2018-04-23 DIAGNOSIS — R5383 Other fatigue: Secondary | ICD-10-CM

## 2018-04-23 DIAGNOSIS — R351 Nocturia: Secondary | ICD-10-CM

## 2018-04-23 DIAGNOSIS — G4752 REM sleep behavior disorder: Secondary | ICD-10-CM

## 2018-04-23 DIAGNOSIS — R0683 Snoring: Secondary | ICD-10-CM

## 2018-04-23 DIAGNOSIS — F515 Nightmare disorder: Secondary | ICD-10-CM | POA: Diagnosis not present

## 2018-04-23 DIAGNOSIS — J449 Chronic obstructive pulmonary disease, unspecified: Secondary | ICD-10-CM

## 2018-04-27 NOTE — Procedures (Signed)
PATIENT'S NAME:  Katherine Katherine Summers, Katherine Katherine Summers DOB:      [birth year]      MR#:    [id number]     DATE OF RECORDING: 04/23/2018 by Luna KitchensGA REFERRING Katherine Summers.D.:  Katherine Katherine Summers, Katherine Summers.D. Study Performed:   Titration to Positive Airway Pressure HISTORY:  Katherine Katherine Summers is returning for a CPAP Titration study following a Baseline PSG from 04/07/2018. She had reached an AHI of 27.5/h, Supine AHI of 30.6/h, and an oxygen saturation nadir of 84%. REM sleep was not recorded. The patient had many spontaneous arousals. Mrs. Katherine Katherine Summers endorsed the Epworth Sleepiness Scale at 7/24 points. The patient's weight 186 pounds with a height of 65 (inches), resulting in a BMI of 30.9 kg/m2. The patient's neck circumference measured 15.5 inches.  CURRENT MEDICATIONS: Amlodipine, Atorvastatin, Escitalopram, Lisinopril-Hydrochlorothiazide, Naproxen, Oxybutynin and Tiotropium.   PROCEDURE:  This is a multichannel digital polysomnogram utilizing the SomnoStar 11.2 system.  Electrodes and sensors were applied and monitored per AASM Specifications.   EEG, EOG, Chin and Limb EMG, were sampled at 200 Hz.  ECG, Snore and Nasal Pressure, Thermal Airflow, Respiratory Effort, CPAP Flow and Pressure, Oximetry was sampled at 50 Hz. Digital video and audio were recorded.      CPAP was initiated at 5 cmH20 with heated humidity per AASM split night standards and pressure was advanced to 10 cmH20 because of hypopneas, apneas and desaturations.  At a PAP pressure of 10 cmH20, there was a reduction of the AHI to 1.3/h with improvement of sleep apnea and nadir to 92%.  Lights Out was at 21:10 and Lights On at 04:59. Total recording time (TRT) was 469 minutes, with a total sleep time (TST) of 287.5 minutes. The patient's sleep latency was 85 minutes. REM latency was 0 minutes.  The sleep efficiency was 61.3 %.    SLEEP ARCHITECTURE: WASO (Wake after sleep onset) was 99.5 minutes.  There were 10 minutes in Stage N1, 127.5 minutes Stage N2, 150 minutes Stage N3 and 0 minutes in  Stage REM.  The percentage of Stage N1 was 3.5%, Stage N2 was 44.3%, Stage N3 was 52.2% and Stage R (REM sleep) was 0%.   RESPIRATORY ANALYSIS:  There was a total of 15 respiratory events: 7 obstructive apneas, 2 central apneas and 0 mixed apneas with 6 hypopneas , 0 respiratory event related arousals (RERAs).     The total APNEA/HYPOPNEA INDEX  (AHI) was 3.1 /hour and the total RESPIRATORY DISTURBANCE INDEX was 3.1 /hour , all 15 events in NREM at a non-REM AHI of 3.1 /hour. The patient spent 255.5 minutes of total sleep time in the supine position and 32 minutes in non-supine. The supine AHI was 3.5, versus a non-supine AHI of 0.0.  OXYGEN SATURATION & C02:  The baseline 02 saturation was 91%, with the lowest being 89%. Time spent below 89% saturation equaled 0 minutes.  PERIODIC LIMB MOVEMENTS:  The patient had a total of 32 Periodic Limb Movements. The Periodic Limb Movement (PLM) index was 6.7 and the PLM Arousal index was 4.6 /hour. The arousals were noted as: 169 were spontaneous, 22 were associated with PLMs, and 8 were associated with respiratory events.     Audio and video analysis did show many arm and hand movements- shoulder movements. This may indicate discomfort or pain.  Snoring was alleviated, the EKG was in keeping with normal sinus rhythm.   DIAGNOSIS : Obstructive Sleep Apnea, fragmented sleep and snoring were all alleviated under CPAP therapy and the patient slept well  for the last 140 minutes of the titration study , tolerated CPAP well. Post-study, the patient indicated that the last part of sleep was better than usual.  1. The patient was fitted with an AirFit P 10 nasal pillow mask by [employer], but the size was not noted.   We ask DME to fit with a nasal pillow at their office. 2.  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 These may be responsible for many spontaneous arousals.   3. REM sleep was not recorded.    1. PLANS/RECOMMENDATIONS: CPAP therapy compliance is defined as 4 hours or more of nightly use. Auto CPAP device with a pressure window of 4-11 cm water with 1 cm EPR, nasal pillow interface , and heated humidity is ordered.   2. Remind patient of sleep hygiene measures, to maintain lean body weight and to avoid sedatives, hypnotics, and alcohol beverage consumption before bedtime.  A follow up appointment will be scheduled in the Sleep Clinic at [facility].   Please call [phone] with any questions.      I certify that I have reviewed the entire raw data recording prior to the issuance of this report in accordance with the Standards of Accreditation of the American Academy of Sleep Medicine (AASM)   Melvyn Novas, Katherine Summers.D.  04-27-2018  Diplomat, American Board of Psychiatry and Neurology  Diplomat, American Board of Sleep Medicine Medical Director, [place] [facility]

## 2018-04-27 NOTE — Addendum Note (Signed)
Addended by: Melvyn NovasHMEIER, [place] Shannahan on: 04/27/2018 06:58 PM   Modules accepted: Orders

## 2018-04-29 ENCOUNTER — Telehealth: Payer: Self-pay | Admitting: Neurology

## 2018-04-29 NOTE — Telephone Encounter (Signed)
-----   Message from Melvyn Novasarmen Dohmeier, MD sent at 04/27/2018  6:58 PM EDT ----- DIAGNOSIS : Obstructive Sleep Apnea, fragmented sleep and snoring  were all alleviated under CPAP therapy and the patient slept well  for the last 140 minutes of the titration study , tolerated CPAP  well. Post-study, the patient indicated that the last part of  sleep was better than usual.  1. The patient was fitted with an AirFit P 10 nasal pillow mask  by [employer], but the size was not noted.  We ask DME to fit with a  nasal pillow at their office. 2. Periodic Limb Movement Disorder continued through the stages  of titration and Video recorded arm and hand movements while in  supine position. These may be responsible for many spontaneous  arousals.  3. REM sleep was not recorded.   1. PLANS/RECOMMENDATIONS: CPAP therapy compliance is defined as 4  hours or more of nightly use. Auto CPAP device with a pressure  window of 4-11 cm water with 1 cm EPR, nasal pillow interface ,  and heated humidity is ordered.   2. Remind patient of sleep hygiene measures, to maintain lean  body weight and to avoid sedatives, hypnotics, and alcohol  beverage consumption before bedtime.

## 2018-04-29 NOTE — Telephone Encounter (Signed)
I called pt. I advised pt that Dr. Vickey Hugerohmeier reviewed their sleep study results and found that pt has OSA. Dr. Vickey Hugerohmeier recommends that pt . I reviewed PAP compliance expectations with the pt. Pt is agreeable to starting a CPAP. I advised pt that an order will be sent to a DME, [facility], and [facility] will call the pt within about one week after they file with the pt's insurance. [facility] will show the pt how to use the machine, fit for masks, and troubleshoot the CPAP if needed. A follow up appt was made for insurance purposes with Dr. Vickey Hugerohmeier on Nov 18,2019 at 1:30 pm. Pt verbalized understanding to arrive 15 minutes early and bring their CPAP. A letter with all of this information in it will be mailed to the pt as a reminder. I verified with the pt that the address we have on file is correct. Pt verbalized understanding of results. Pt had no questions at this time but was encouraged to call back if questions arise.

## 2018-05-18 DIAGNOSIS — R7309 Other abnormal glucose: Secondary | ICD-10-CM | POA: Diagnosis not present

## 2018-05-18 DIAGNOSIS — Z6832 Body mass index (BMI) 32.0-32.9, adult: Secondary | ICD-10-CM

## 2018-05-18 DIAGNOSIS — N182 Chronic kidney disease, stage 2 (mild): Secondary | ICD-10-CM | POA: Diagnosis not present

## 2018-05-18 DIAGNOSIS — Z79899 Other long term (current) drug therapy: Secondary | ICD-10-CM

## 2018-05-18 DIAGNOSIS — E669 Obesity, unspecified: Secondary | ICD-10-CM

## 2018-05-18 DIAGNOSIS — E673 Hypervitaminosis D: Secondary | ICD-10-CM

## 2018-05-18 DIAGNOSIS — G4733 Obstructive sleep apnea (adult) (pediatric): Secondary | ICD-10-CM | POA: Diagnosis not present

## 2018-05-18 DIAGNOSIS — I129 Hypertensive chronic kidney disease with stage 1 through stage 4 chronic kidney disease, or unspecified chronic kidney disease: Secondary | ICD-10-CM | POA: Diagnosis not present

## 2018-07-16 ENCOUNTER — Encounter: Payer: Self-pay | Admitting: Neurology

## 2018-07-18 LAB — HM DIABETES EYE EXAM

## 2018-07-22 ENCOUNTER — Encounter: Payer: Self-pay | Admitting: Neurology

## 2018-07-22 ENCOUNTER — Ambulatory Visit (INDEPENDENT_AMBULATORY_CARE_PROVIDER_SITE_OTHER): Payer: Medicare Other | Admitting: Neurology

## 2018-07-22 VITALS — BP 114/67 | HR 81 | Ht 65.5 in | Wt 189.0 lb

## 2018-07-22 DIAGNOSIS — F518 Other sleep disorders not due to a substance or known physiological condition: Secondary | ICD-10-CM | POA: Diagnosis not present

## 2018-07-22 DIAGNOSIS — Z7189 Other specified counseling: Secondary | ICD-10-CM | POA: Diagnosis not present

## 2018-07-22 DIAGNOSIS — G4733 Obstructive sleep apnea (adult) (pediatric): Secondary | ICD-10-CM

## 2018-07-22 DIAGNOSIS — Z9989 Dependence on other enabling machines and devices: Secondary | ICD-10-CM

## 2018-07-22 NOTE — Patient Instructions (Signed)

## 2018-07-22 NOTE — Progress Notes (Signed)
SLEEP MEDICINE CLINIC   Provider:  Larey Seat, MD   Primary Care Physician:  Glendale Chard, MD   Referring Provider: Glendale Chard, MD   Chief Complaint  Patient presents with  . Follow-up    pt alone, rm 10. pt states that it took a while to get the mask figured out but she states since that was figured out she does better. DME [facility].     HPI:  Katherine Summers is a 72 y.o. female patient , following up on 07-22-2018 after 2 sleep studies.  The patient underwent a baseline polysomnography on 07 April 2018, the procedure revealed moderate to severe sleep apnea with an AHI of 27.5, there was no REM sleep noted, supine AHI was just a tad bit higher at 30.6/h.  The patient had borderline CO2 retention, no significant oxygen desaturation.  Her EKG was a normal sinus rhythm and she was asked to return for a CPAP titration.  The vast majority of her respiratory events were hypopneas, 166 of them, indicating that she was a shallow breather. She returned for an attended CPAP titration on 20 August, had a significant sleep latency of 85 minutes still had no REM sleep and sleep efficiency was only 61.3%.  Her AHI was significantly reduced to 3.1/h.  CPAP was obviously not easy for her to tolerate.  At the last pressure under which she slept very well was a 10 cm setting, I had asked for a nasal pillow interface dream wear "to be fitted, -during the sleep study she was given a full facemask to which she had objected. She has not brought the machine nor her interface with her.  She has not been fitted with a nasal swift with ear loops , but stated she is interested as her hair gets entangled.  Compliance download : Katherine Summers has used the air sense 10 AutoSet CPAP at home with a minimum pressure of 4 and a maximum pressure of 11 cmH2O, average use of time is 9 hours 51 minutes each day, she is 100% compliant by days and time.  Her residual AHI is only 1.9 AHI per hour.  She does have some  significant air leaks the 95th percentile is 31.3 L/min, pressure is 95th percentile 8.2 cm. She reportedly gets a "smiley face" on the CPAP display in the morning.     Consultation; patient  seen here in a referral from Dr. Baird Cancer for evaluation of nocturia. I have the pleasure of meeting with Katherine Summers, a 72 year old female patient of Dr. Baird Cancer that looks very familiar to me but we could not find out  where we have met. Mrs. Iannacone reports that she had bilateral knee replacements but that her gait has been limited her ambulation is limited by pain, she is using a cane and altogether she feels that the knee replacements have not changed her symptoms to the better in comparison to how she felt before.  She used to sleep well through the night, and never had sleep problems per se.  By a winter 2018 however she began having very frequent bathroom breaks at night which fragmented her sleep.  She was placed on oxybutynin but this has not helped the frequency.  Her primary care physician is therefore concerned that it may be related to obstructive sleep apnea.  I would like to add that the patient also is on hydrochlorothiazide a very low dose 12.5 mg to be taken in the morning each day.  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  She continues with her meds and is compliant, she has not had any recent additional changes in her medications she is using Spiriva, oxybutynin, lisinopril-hydrochlorothiazide, escitalopram-Lexapro 10 mg atorvastatin 20 mg and amlodipine 5 mg tablets.  While she does report that her sleep is no longer as restorative and refreshing as it used to be she feels rather fatigued and excessively sleepy.  Her Epworth sleepiness score was endorsed at seven-point her fatigue severity score at 58 points, the geriatric depression score at 5 out of 15 points.  Overall coughing, wheezing, snoring and shortness of  breath as well as increased thirst have affected her sleepiness probably as much as incontinence and nocturia.     Sleep habits are as follows: The patient reports that her bedtime is usually around 11:30 PM after she works the late night meals on TV.  She does not have difficulties falling asleep she just cannot stay asleep follow-up.  Is an hour she has her first bathroom break and from there on hourly.  Sometimes every 2, so she may have up to 5 bathroom breaks at night. The patient's bedroom is described as cool, quiet and dark.  Conducive to sleep.  She sleeps alone.  She sleeps supine or on either side.  She cannot sleep prone she supports her knees with a pillow at night.  Before she had knee pain she did not used to sleep supine.  She sleeps with 2 pillows for head support, her bed is not adjusted. She reports that when she gets up very quickly she is lightheaded and dizzy. Sometimes she has vertigo, and she has learned to take a couple of seconds at a deep breath before she initiates movement.She denies waking up with palpitations, diaphoresis, headaches or chest pain. She cannot remember the last time she slept well.   Sleep medical history and family sleep history: son has sleep apnea. She has COPD.   Social history:  Widowed, 2 children- adult sons. Non smoker- quit in 1990. 7 pack years  COPD. Rare ETOH 2 a year.  Caffeine use - 2 cups of coffee a week, pepsi 5 a week, mostly water. Iced tea- when eating out.    Review of Systems: Out of a complete 14 system review, the patient complains of only the following symptoms, and all other reviewed systems are negative.Nocturia reduced from every 90 minutes to every 3 hours.  Insomnia improved , joint pain, coughing, gasping. Mind keeps racing. She sleeps longer,   Epworth score up from 7 to 10 points out of  24 points since being on CPAP (?)    , Fatigue severity score 63/ 63   , depression score is high 11/ 15 points.    Social  History   Socioeconomic History  . Marital status: Single    Spouse name: Not on file  . Number of children: Not on file  . Years of education: Not on file  . Highest education level: Not on file  Occupational History  . Not on file  Social Needs  . Financial resource strain: Not on file  . Food insecurity:    Worry: Not on file    Inability: Not on file  . Transportation needs:    Medical: Not on file    Non-medical: Not on file  Tobacco Use  . Smoking status: Former Smoker    Types: Cigarettes    Last attempt  to quit: 1998    Years since quitting: 21.8  . Smokeless tobacco: Never Used  Substance and Sexual Activity  . Alcohol use: Not on file  . Drug use: No  . Sexual activity: Not on file  Lifestyle  . Physical activity:    Days per week: Not on file    Minutes per session: Not on file  . Stress: Not on file  Relationships  . Social connections:    Talks on phone: Not on file    Gets together: Not on file    Attends religious service: Not on file    Active member of club or organization: Not on file    Attends meetings of clubs or organizations: Not on file    Relationship status: Not on file  . Intimate partner violence:    Fear of current or ex partner: Not on file    Emotionally abused: Not on file    Physically abused: Not on file    Forced sexual activity: Not on file  Other Topics Concern  . Not on file  Social History Narrative  . Not on file    Family History  Problem Relation Age of Onset  . Stroke Mother   . Colon cancer Father   . Colon cancer Sister   . Stroke Maternal Grandmother   . Colon cancer Paternal Grandfather     Past Medical History:  Diagnosis Date  . Allergy   . CKD (chronic kidney disease) stage 2, GFR 60-89 ml/min   . COPD (chronic obstructive pulmonary disease) ([place])   . Depression 08/01/2016  . Dizziness and giddiness   . Essential hypertension 08/01/2016  . Hyperlipidemia   . Hypertension   . Murmur 08/01/2016  .  Pre-diabetes   . Shortness of breath 08/01/2016  . Vitamin D deficiency     Past Surgical History:  Procedure Laterality Date  . COLONOSCOPY    . FOOT SURGERY    . TONSILLECTOMY    . 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 TUBAL LIGATION      Current Outpatient Medications  Medication Sig Dispense Refill  . amLODipine (NORVASC) 5 MG tablet Take 5 mg by mouth daily.  5  . atorvastatin (LIPITOR) 20 MG tablet Take 20 mg by mouth daily.   3  . escitalopram (LEXAPRO) 5 MG tablet Take 5 mg by mouth daily.  5  . lisinopril-hydrochlorothiazide (PRINZIDE,ZESTORETIC) 20-12.5 MG per tablet Take 1 tablet by mouth daily.  5  . naproxen (NAPROSYN) 500 MG tablet Take 1 tablet by mouth every 12 (twelve) hours as needed.    Marland Kitchen oxybutynin (DITROPAN-XL) 10 MG 24 hr tablet TK 1 T PO QD  1  . tiotropium (SPIRIVA HANDIHALER) 18 MCG inhalation capsule Spiriva with HandiHaler 18 mcg and inhalation capsules    . VITAMIN D PO Take 5,000 mg by mouth. Take 1 daily mon through fri    . Melatonin 2.5 MG CAPS One at bedtime po (Patient not taking: Reported on 07/22/2018) 30 capsule 0   No current facility-administered medications for this visit.     Allergies as of 07/22/2018  . (No Known Allergies)    Vitals: BP 114/67   Pulse 81   Ht 5' 5.5" (1.664 m)   Wt 189 lb (85.7 kg)   BMI 30.97 kg/m  Last Weight:  Wt  Readings from Last 1 Encounters:  07/22/18 189 lb (85.7 kg)   ZCH:YIFO mass index is 30.97 kg/m.     Last Height:   Ht Readings from Last 1 Encounters:  07/22/18 5' 5.5" (1.664 m)    Physical exam:  General: The patient is awake, alert and appears not in acute distress. The patient is well groomed. Head: Normocephalic, atraumatic. Neck is supple. Mallampati 3,  neck circumference:15. 5" . Nasal airflow a rarely patent - she is a  mouth breather  ,. Retrognathia is mild . Cardiovascular:  Regular rate and rhythm , with a mitral murmur, no carotid bruit, and without distended neck veins. Respiratory: Lungs with rhonchi , wheezing to auscultation. Skin:  Without evidence of edema, or rash Trunk: BMI is 31 .  The patient's posture is stooped.   Neurologic exam : The patient is awake and alert, oriented to place and time. Mood and affect are anxious.   Cranial nerves: Pupils are equal and briskly reactive to light. Visual fields by finger perimetry are intact. Hearing to finger rub intact.  Facial sensation intact to fine touch. Facial motor strength is symmetric and tongue and uvula move midline. Shoulder shrug was symmetrical.  Motor exam: ROM of both knees is limited. Upper extremities have full ROM.  Diffusely elevated tone, muscle bulk and symmetric strength in upper extremities, but weak grip- she has a stronger pinch than grip strength. Equal shoulder shrug.  Sensory:  Fine touch, pinprick and vibration were normal. Coordination: Finger-to-nose maneuver normal without evidence of ataxia, dysmetria but she has a mild resting tremor, most dominant over the right.  Gait and station: Patient walks with a cane and has a slowed gait, slightly shuffling, stooped.  Needs assistive device . Deep tendon reflexes: in the upper and lower extremities are symmetric and intact.  Assessment:  After physical and neurologic examination, review of laboratory studies, results of polysomnography and / or neurophysiology testing and pre-existing records as far as provided in visit., my assessment is:    1) OSA confirmed by PSG and treated with CPAP. Needs to use distilled water, change water every day. 100% compliant yet not less sleepy or fatigued - may be due to residual depression.  Air leaks are high- in 6 month try a Bella swift with ear lobes. She then told me about her taking the mask off when going to the bathroom without  switching the machine off.   2) She reports she still dreams vividly, and frequently acting out her dreams, yelling out. Kicking, boxing. Increased dreams since using CPAP and having apnea control. Remebers her dreams now. There are physical findings , such as diffuse tone elevation, mild resting tremor on the dominant side, shuffling and history of 2 falls before CPAP. Need to keep in mind that she could develop REM BD ,Lewy body dementia / Parkinsonisms.  3)  Nocturia- 4-5 times , improved to 2 at night      The patient was advised of the nature of the diagnosed disorder , the treatment options and the  risks for general health and wellness arising from not treating the condition.  I spent more than 25 minutes of face to face time with the patient. Greater than 50% of time was spent in counseling and coordination of care. We have discussed the diagnosis and differential and I answered the patient's questions.    Plan:  Treatment plan and additional workup : RV with NP in 6 month. Needs screening for PD, depression and  compliance.   Switch CPAP off before taking mask off!!   Larey Seat, MD [id number]/[id number], 4:71 PM  Certified in Neurology by ABPN Certified in [place] by [facility] [address] [place], [place] [address]

## 2018-08-13 ENCOUNTER — Other Ambulatory Visit: Payer: Self-pay | Admitting: Nurse Practitioner

## 2018-08-13 ENCOUNTER — Other Ambulatory Visit: Payer: Self-pay | Admitting: Internal Medicine

## 2018-08-14 ENCOUNTER — Other Ambulatory Visit: Payer: Self-pay | Admitting: Internal Medicine

## 2018-08-14 DIAGNOSIS — R351 Nocturia: Secondary | ICD-10-CM

## 2018-08-15 ENCOUNTER — Other Ambulatory Visit: Payer: Self-pay | Admitting: Internal Medicine

## 2018-10-04 LAB — HM DEXA SCAN

## 2018-10-10 ENCOUNTER — Ambulatory Visit: Payer: Medicare Other | Admitting: Internal Medicine

## 2018-10-10 ENCOUNTER — Encounter: Payer: Medicare Other | Admitting: Internal Medicine

## 2018-10-10 ENCOUNTER — Ambulatory Visit: Payer: Medicare Other

## 2018-10-21 ENCOUNTER — Telehealth: Payer: Self-pay | Admitting: Internal Medicine

## 2018-10-21 NOTE — Telephone Encounter (Signed)
Contact pt regarding bone density results: osteopenia. Needs to engage in weightbearing exercise like walking 3 days weekly. Also needs to take vit d 2000 units once daily and increase calcium intake through her diet.

## 2018-10-24 ENCOUNTER — Telehealth: Payer: Self-pay

## 2018-10-24 NOTE — Telephone Encounter (Signed)
Left the patient a detailed message at the pt's request. Contact pt regarding bone density results: osteopenia. Needs to engage in weightbearing exercise like walking 3 days weekly. Also needs to take vit d 2000 units once daily and increase calcium intake through her diet.       Documentation

## 2018-10-31 ENCOUNTER — Ambulatory Visit (INDEPENDENT_AMBULATORY_CARE_PROVIDER_SITE_OTHER): Payer: Medicare Other | Admitting: Nurse Practitioner

## 2018-10-31 ENCOUNTER — Encounter: Payer: Self-pay | Admitting: Nurse Practitioner

## 2018-10-31 ENCOUNTER — Ambulatory Visit (INDEPENDENT_AMBULATORY_CARE_PROVIDER_SITE_OTHER): Payer: Medicare Other

## 2018-10-31 VITALS — BP 138/82 | HR 61 | Temp 97.8°F | Ht 65.0 in | Wt 195.8 lb

## 2018-10-31 VITALS — BP 132/82 | HR 61 | Temp 97.8°F | Ht 65.0 in | Wt 195.0 lb

## 2018-10-31 DIAGNOSIS — Z0000 Encounter for general adult medical examination without abnormal findings: Secondary | ICD-10-CM

## 2018-10-31 DIAGNOSIS — I10 Essential (primary) hypertension: Secondary | ICD-10-CM

## 2018-10-31 DIAGNOSIS — R350 Frequency of micturition: Secondary | ICD-10-CM | POA: Diagnosis not present

## 2018-10-31 DIAGNOSIS — Z9989 Dependence on other enabling machines and devices: Secondary | ICD-10-CM

## 2018-10-31 DIAGNOSIS — Z23 Encounter for immunization: Secondary | ICD-10-CM | POA: Diagnosis not present

## 2018-10-31 DIAGNOSIS — R7303 Prediabetes: Secondary | ICD-10-CM | POA: Insufficient documentation

## 2018-10-31 DIAGNOSIS — K589 Irritable bowel syndrome without diarrhea: Secondary | ICD-10-CM

## 2018-10-31 DIAGNOSIS — G4733 Obstructive sleep apnea (adult) (pediatric): Secondary | ICD-10-CM

## 2018-10-31 LAB — POCT URINALYSIS DIPSTICK
Bilirubin, UA: NEGATIVE
Glucose, UA: NEGATIVE
Ketones, UA: NEGATIVE
LEUKOCYTES UA: NEGATIVE
Nitrite, UA: NEGATIVE
Protein, UA: NEGATIVE
Spec Grav, UA: 1.015 (ref 1.010–1.025)
Urobilinogen, UA: 0.2 E.U./dL
pH, UA: 7 (ref 5.0–8.0)

## 2018-10-31 MED ORDER — PNEUMOCOCCAL 13-VAL CONJ VACC IM SUSP: 0.5000 mL | INTRAMUSCULAR | 0 refills | Status: AC

## 2018-10-31 MED ORDER — MIRABEGRON ER 25 MG PO TB24: 25.0000 mg | ORAL_TABLET | Freq: Every day | ORAL | 2 refills | Status: DC

## 2018-10-31 NOTE — Patient Instructions (Signed)
Katherine Summers , Thank you for taking time to come for your Medicare Wellness Visit. I appreciate your ongoing commitment to your health goals. Please review the following plan we discussed and let me know if I can assist you in the future.   Screening recommendations/referrals: Colonoscopy: ordered Mammogram: 09/2018 Bone Density: 09/2018 Recommended yearly ophthalmology/optometry visit for glaucoma screening and checkup Recommended yearly dental visit for hygiene and checkup  Vaccinations: Influenza vaccine: 05/2018 Pneumococcal vaccine: 06/2013 Tdap vaccine: 06/2013 Shingles vaccine: discussed    Advanced directives: Please bring a copy of your POA (Power of Katherine Summers) and/or Living Will to your next appointment.    Conditions/risks identified: Obesity  Next appointment: 10/31/2018   Preventive Care 65 Years and Older, Female Preventive care refers to lifestyle choices and visits with your health care provider that can promote health and wellness. What does preventive care include?  A yearly physical exam. This is also called an annual well check.  Dental exams once or twice a year.  Routine eye exams. Ask your health care provider how often you should have your eyes checked.  Personal lifestyle choices, including:  Daily care of your teeth and gums.  Regular physical activity.  Eating a healthy diet.  Avoiding tobacco and drug use.  Limiting alcohol use.  Practicing safe sex.  Taking low-dose aspirin every day.  Taking vitamin and mineral supplements as recommended by your health care provider. What happens during an annual well check? The services and screenings done by your health care provider during your annual well check will depend on your age, overall health, lifestyle risk factors, and family history of disease. Counseling  Your health care provider may ask you questions about your:  Alcohol use.  Tobacco use.  Drug use.  Emotional well-being.  Home  and relationship well-being.  Sexual activity.  Eating habits.  History of falls.  Memory and ability to understand (cognition).  Work and work Astronomer.  Reproductive health. Screening  You may have the following tests or measurements:  Height, weight, and BMI.  Blood pressure.  Lipid and cholesterol levels. These may be checked every 5 years, or more frequently if you are over 65 years old.  Skin check.  Lung cancer screening. You may have this screening every year starting at age 22 if you have a 30-pack-year history of smoking and currently smoke or have quit within the past 15 years.  Fecal occult blood test (FOBT) of the stool. You may have this test every year starting at age 18.  Flexible sigmoidoscopy or colonoscopy. You may have a sigmoidoscopy every 5 years or a colonoscopy every 10 years starting at age 44.  Hepatitis C blood test.  Hepatitis B blood test.  Sexually transmitted disease (STD) testing.  Diabetes screening. This is done by checking your blood sugar (glucose) after you have not eaten for a while (fasting). You may have this done every 1-3 years.  Bone density scan. This is done to screen for osteoporosis. You may have this done starting at age 97.  Mammogram. This may be done every 1-2 years. Talk to your health care provider about how often you should have regular mammograms. Talk with your health care provider about your test results, treatment options, and if necessary, the need for more tests. Vaccines  Your health care provider may recommend certain vaccines, such as:  Influenza vaccine. This is recommended every year.  Tetanus, diphtheria, and acellular pertussis (Tdap, Td) vaccine. You may need a Td booster every 10  years.  Zoster vaccine. You may need this after age 11.  Pneumococcal 13-valent conjugate (PCV13) vaccine. One dose is recommended after age 72.  Pneumococcal polysaccharide (PPSV23) vaccine. One dose is recommended  after age 71. Talk to your health care provider about which screenings and vaccines you need and how often you need them. This information is not intended to replace advice given to you by your health care provider. Make sure you discuss any questions you have with your health care provider. Document Released: 09/17/2015 Document Revised: 05/10/2016 Document Reviewed: 06/22/2015 Elsevier Interactive Patient Education  2017 [place] Prevention in the Home Falls can cause injuries. They can happen to people of all ages. There are many things you can do to make your home safe and to help prevent falls. What can I do on the outside of my home?  Regularly fix the edges of walkways and driveways and fix any cracks.  Remove anything that might make you trip as you walk through a door, such as a raised step or threshold.  Trim any bushes or trees on the path to your home.  Use bright outdoor lighting.  Clear any walking paths of anything that might make someone trip, such as rocks or tools.  Regularly check to see if handrails are loose or broken. Make sure that both sides of any steps have handrails.  Any raised decks and porches should have guardrails on the edges.  Have any leaves, snow, or ice cleared regularly.  Use sand or salt on walking paths during winter.  Clean up any spills in your garage right away. This includes oil or grease spills. What can I do in the bathroom?  Use night lights.  Install grab bars by the toilet and in the tub and shower. Do not use towel bars as grab bars.  Use non-skid mats or decals in the tub or shower.  If you need to sit down in the shower, use a plastic, non-slip stool.  Keep the floor dry. Clean up any water that spills on the floor as soon as it happens.  Remove soap buildup in the tub or shower regularly.  Attach bath mats securely with double-sided non-slip rug tape.  Do not have throw rugs and other things on the floor  that can make you trip. What can I do in the bedroom?  Use night lights.  Make sure that you have a light by your bed that is easy to reach.  Do not use any sheets or blankets that are too big for your bed. They should not hang down onto the floor.  Have a firm chair that has side arms. You can use this for support while you get dressed.  Do not have throw rugs and other things on the floor that can make you trip. What can I do in the kitchen?  Clean up any spills right away.  Avoid walking on wet floors.  Keep items that you use a lot in easy-to-reach places.  If you need to reach something above you, use a strong step stool that has a grab bar.  Keep electrical cords out of the way.  Do not use floor polish or wax that makes floors slippery. If you must use wax, use non-skid floor wax.  Do not have throw rugs and other things on the floor that can make you trip. What can I do with my stairs?  Do not leave any items on the stairs.  Make sure that  there are handrails on both sides of the stairs and use them. Fix handrails that are broken or loose. Make sure that handrails are as long as the stairways.  Check any carpeting to make sure that it is firmly attached to the stairs. Fix any carpet that is loose or worn.  Avoid having throw rugs at the top or bottom of the stairs. If you do have throw rugs, attach them to the floor with carpet tape.  Make sure that you have a light switch at the top of the stairs and the bottom of the stairs. If you do not have them, ask someone to add them for you. What else can I do to help prevent falls?  Wear shoes that:  Do not have high heels.  Have rubber bottoms.  Are comfortable and fit you well.  Are closed at the toe. Do not wear sandals.  If you use a stepladder:  Make sure that it is fully opened. Do not climb a closed stepladder.  Make sure that both sides of the stepladder are locked into place.  Ask someone to hold it  for you, if possible.  Clearly mark and make sure that you can see:  Any grab bars or handrails.  First and last steps.  Where the edge of each step is.  Use tools that help you move around (mobility aids) if they are needed. These include:  Canes.  Walkers.  Scooters.  Crutches.  Turn on the lights when you go into a dark area. Replace any light bulbs as soon as they burn out.  Set up your furniture so you have a clear path. Avoid moving your furniture around.  If any of your floors are uneven, fix them.  If there are any pets around you, be aware of where they are.  Review your medicines with your doctor. Some medicines can make you feel dizzy. This can increase your chance of falling. Ask your doctor what other things that you can do to help prevent falls. This information is not intended to replace advice given to you by your health care provider. Make sure you discuss any questions you have with your health care provider. Document Released: 06/17/2009 Document Revised: 01/27/2016 Document Reviewed: 09/25/2014 Elsevier Interactive Patient Education  2017 [employer].

## 2018-10-31 NOTE — Progress Notes (Signed)
Subjective:     Patient ID: Katherine Summers , female    DOB: 1946/06/30 , 73 y.o.   MRN: [id number]   Chief Complaint  Patient presents with  . other    Physical AWV fasting     HPI  Hypertension  This is a chronic problem. The problem is unchanged. The problem is controlled. Pertinent negatives include no anxiety, chest pain, headaches, palpitations or peripheral edema. There are no associated agents to hypertension.  Urinary Frequency   This is a new problem. The current episode started 1 to 4 weeks ago. The problem occurs intermittently. The problem has been gradually worsening. Associated symptoms include frequency. Pertinent negatives include no nausea.    The patient states she uses none for birth control. Last LMP was No LMP recorded. Patient is postmenopausal.. Negative for Dysmenorrhea and Negative for Menorrhagia Mammogram last done she goes to [facility].  Negative for: breast discharge, breast lump(s), breast pain and breast self exam.  Pertinent negatives include abnormal bleeding (hematology), anxiety, decreased libido, depression, difficulty falling sleep, dyspareunia, history of infertility, nocturia, sexual dysfunction, sleep disturbances, urinary incontinence, urinary urgency, vaginal discharge and vaginal itching. Diet regular.The patient states her exercise level is  minimal   . The patient's tobacco use is:  Social History   Tobacco Use  Smoking Status Former Smoker  . Types: Cigarettes  . Last attempt to quit: 1998  . Years since quitting: 22.2  Smokeless Tobacco Never Used  . She has been exposed to passive smoke. The patient's alcohol use is:  Social History   Substance and Sexual Activity  Alcohol Use Not Currently  . Alcohol/week: 0.0 standard drinks  . Additional information:   Past Medical History:  Diagnosis Date  . Allergy   . CKD (chronic kidney disease) stage 2, GFR 60-89 ml/min   . COPD (chronic obstructive pulmonary disease) (HCC)   . Depression  08/01/2016  . Dizziness and giddiness   . Essential hypertension 08/01/2016  . Hyperlipidemia   . Hypertension   . Murmur 08/01/2016  . Pre-diabetes   . Shortness of breath 08/01/2016  . Vitamin D deficiency      Family History  Problem Relation Age of Onset  . Stroke Mother   . Colon cancer Father   . Colon cancer Sister   . Stroke Maternal Grandmother   . Colon cancer Paternal Grandfather      Current Outpatient Medications:  .  amLODipine (NORVASC) 5 MG tablet, TAKE 1 TABLET BY MOUTH EVERY DAY, Disp: 90 tablet, Rfl: 1 .  atorvastatin (LIPITOR) 20 MG tablet, TAKE 1 TABLET BY MOUTH ONCE DAILY, Disp: 90 tablet, Rfl: 0 .  escitalopram (LEXAPRO) 10 MG tablet, TAKE 1 TABLET BY MOUTH EVERY DAY, Disp: 90 tablet, Rfl: 1 .  lisinopril-hydrochlorothiazide (PRINZIDE,ZESTORETIC) 20-12.5 MG tablet, TAKE 1 TABLET BY MOUTH ONCE DAILY, Disp: 90 tablet, Rfl: 0 .  Melatonin 2.5 MG CAPS, One at bedtime po (Patient taking differently: One at bedtime po prn), Disp: 30 capsule, Rfl: 0 .  VITAMIN D PO, Take 5,000 mg by mouth. Take 1 daily mon through fri, Disp: , Rfl:  .  escitalopram (LEXAPRO) 5 MG tablet, Take 5 mg by mouth daily., Disp: , Rfl: 5 .  naproxen (NAPROSYN) 500 MG tablet, Take 1 tablet by mouth every 12 (twelve) hours as needed., Disp: , Rfl:  .  oxybutynin (DITROPAN-XL) 10 MG 24 hr tablet, TAKE 1 TABLET BY MOUTH EVERY DAY, Disp: 90 tablet, Rfl: 0 .  tiotropium (SPIRIVA HANDIHALER)  18 MCG inhalation capsule, Spiriva with HandiHaler 18 mcg and inhalation capsules, Disp: , Rfl:    No Known Allergies   Review of Systems  Constitutional: Negative.   HENT: Negative.   Eyes: Negative.   Respiratory: Negative.   Cardiovascular: Negative for chest pain, palpitations and leg swelling.  Gastrointestinal: Negative for constipation and nausea.       When she eats she has a bowel movement every time.   Endocrine: Negative.   Genitourinary: Positive for frequency.  Musculoskeletal: Negative.    Skin: Negative.   Allergic/Immunologic: Negative.   Neurological: Negative.  Negative for dizziness and headaches.  Hematological: Negative.   Psychiatric/Behavioral: Negative.      Today's Vitals   10/31/18 0949  BP: 138/82  Pulse: 61  Temp: 97.8 F (36.6 C)  SpO2: 98%  Weight: 195 lb 12.8 oz (88.8 kg)  Height: 5\' 5"  (1.651 m)   Body mass index is 32.58 kg/m.   Objective:  Physical Exam Vitals signs reviewed.  Constitutional:      Appearance: Normal appearance. She is well-developed.  HENT:     Head: Normocephalic and atraumatic.     Right Ear: Hearing, tympanic membrane, ear canal and external ear normal.     Left Ear: Hearing, tympanic membrane, ear canal and external ear normal.     Nose: Nose normal.     Mouth/Throat:     Mouth: Mucous membranes are moist.  Eyes:     General: Lids are normal.     Conjunctiva/sclera: Conjunctivae normal.     Pupils: Pupils are equal, round, and reactive to light.     Funduscopic exam:    Right eye: No papilledema.        Left eye: No papilledema.  Neck:     Musculoskeletal: Full passive range of motion without pain, normal range of motion and neck supple.     Thyroid: No thyroid mass.     Vascular: No carotid bruit.  Cardiovascular:     Rate and Rhythm: Normal rate and regular rhythm.     Pulses: Normal pulses.     Heart sounds: Normal heart sounds. No murmur.  Pulmonary:     Effort: Pulmonary effort is normal. No respiratory distress.     Breath sounds: Normal breath sounds. No wheezing or rales.  Abdominal:     General: Abdomen is flat. Bowel sounds are normal.     Palpations: Abdomen is soft.  Musculoskeletal: Normal range of motion.        General: No swelling.     Right lower leg: No edema.     Left lower leg: No edema.  Skin:    General: Skin is warm and dry.     Capillary Refill: Capillary refill takes less than 2 seconds.  Neurological:     General: No focal deficit present.     Mental Status: She is alert  and oriented to person, place, and time.     Cranial Nerves: No cranial nerve deficit.     Sensory: No sensory deficit.  Psychiatric:        Mood and Affect: Mood normal.        Behavior: Behavior normal.        Thought Content: Thought content normal.        Judgment: Judgment normal.         Assessment And Plan:   1. Health maintenance examination . Behavior modifications discussed and diet history reviewed.   . Pt will continue to exercise  regularly and modify diet with low GI, plant based foods and decrease intake of processed foods.  . Recommend intake of daily multivitamin, Vitamin D, and calcium.  . Recommend mammogram and colonoscopy for preventive screenings, as well as recommend immunizations that include influenza, TDAP, and Shingles  2. Essential hypertension . B/P is fair control.  . CMP ordered to check renal function.  . The importance of regular exercise and dietary modification was stressed to the patient.  . Stressed importance of losing ten percent of her body weight to help with B/P control.  . The weight loss would help with decreasing cardiac and cancer risk as well.   - CMP14 + Anion Gap - CBC no Diff  3. Irritable bowel syndrome, unspecified type  Persistent  I will refer to GI for further evaluation - Ambulatory referral to Gastroenterology  4. OSA on CPAP  Wears regularly and feels better when taking  5. Prediabetes  Chronic, fair control  Continue with current medications  Encouraged to limit intake of sugary foods and drinks  Encouraged to increase physical activity to 150 minutes per week as tolerated - Hemoglobin A1c  6. Urinary frequency  Will try her on myrbetriq due to not improving, having multiple episodes of urinary frequency - mirabegron ER (MYRBETRIQ) 25 MG TB24 tablet; Take 1 tablet (25 mg total) by mouth daily.  Dispense: 30 tablet; Refill: 2     Arnette Felts, FNP

## 2018-10-31 NOTE — Progress Notes (Signed)
Subjective:   Katherine Summers is a 73 y.o. female who presents for Medicare Annual (Subsequent) preventive examination.  Review of Systems:  n/a Cardiac Risk Factors include: advanced age (>55men, >33 women);hypertension;sedentary lifestyle;obesity (BMI >30kg/m2)     Objective:     Vitals: BP 132/82 (Patient Position: Sitting)   Pulse 61   Temp 97.8 F (36.6 C) (Oral)   Ht 5\' 5"  (1.651 m)   Wt 195 lb (88.5 kg)   BMI 32.45 kg/m   Body mass index is 32.45 kg/m.  Advanced Directives 10/31/2018 02/24/2018 10/31/2016 10/31/2016 10/25/2016  Does Patient Have a Medical Advance Directive? Yes No Yes Yes Yes  Type of Advance Directive Living will;Healthcare Power of English as a second language teacher of [place];Living will - Healthcare Power of [place];Living will  Does patient want to make changes to medical advance directive? No - Patient declined - No - Patient declined - No - Patient declined  Copy of Healthcare Power of Attorney in Chart? No - copy requested - No - copy requested - -  Would patient like information on creating a medical advance directive? - No - Patient declined - - -    Tobacco Social History   Tobacco Use  Smoking Status Former Smoker  . Types: Cigarettes  . Last attempt to quit: 1998  . Years since quitting: 22.1  Smokeless Tobacco Never Used     Counseling given: Not Answered   Clinical Intake:  Pre-visit preparation completed: Yes  Pain : No/denies pain     Nutritional Status: BMI > 30  Obese Nutritional Risks: None Diabetes: No  How often do you need to have someone help you when you read instructions, pamphlets, or other written materials from your doctor or pharmacy?: 1 - Never What is the last grade level you completed in school?: some college  Interpreter Needed?: No  Information entered by :: NAllen LPN  Past Medical History:  Diagnosis Date  . Allergy   . CKD (chronic kidney disease) stage 2, GFR 60-89 ml/min   . COPD (chronic  obstructive pulmonary disease) (HCC)   . Depression 08/01/2016  . Dizziness and giddiness   . Essential hypertension 08/01/2016  . Hyperlipidemia   . Hypertension   . Murmur 08/01/2016  . Pre-diabetes   . Shortness of breath 08/01/2016  . Vitamin D deficiency    Past Surgical History:  Procedure Laterality Date  . COLONOSCOPY    . FOOT SURGERY    . TONSILLECTOMY    . 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 TUBAL LIGATION     Family History  Problem Relation Age of Onset  . Stroke Mother   . Colon cancer Father   . Colon cancer Sister   . Stroke Maternal Grandmother   . Colon cancer Paternal Grandfather    Social History   Socioeconomic History  . Marital status: Single    Spouse name: Not on file  . Number of children: Not on file  . Years of education: Not on file  . Highest education level: Not on file  Occupational History  . Occupation: retired  Engineer, production  . Financial resource strain: Not hard at all  . Food insecurity:    Worry: Never true    Inability: Never true  . Transportation needs:    Medical: No    Non-medical: No  Tobacco Use  . Smoking status: Former Smoker    Types: Cigarettes    Last attempt to quit: 1998    Years since quitting: 22.1  . Smokeless tobacco: Never Used  Substance and Sexual Activity  . Alcohol use: Not Currently    Alcohol/week: 0.0 standard drinks  . Drug use: No  . Sexual activity: Not Currently  Lifestyle  . Physical activity:    Days per week: 0 days    Minutes per session: 0 min  . Stress: Not at all  Relationships  . Social connections:    Talks on phone: Not on file    Gets together: Not on file    Attends religious service: Not on file    Active member of club or organization: Not on file    Attends meetings of clubs or organizations: Not on file     Relationship status: Not on file  Other Topics Concern  . Not on file  Social History Narrative  . Not on file    Outpatient Encounter Medications as of 10/31/2018  Medication Sig  . amLODipine (NORVASC) 5 MG tablet TAKE 1 TABLET BY MOUTH EVERY DAY  . atorvastatin (LIPITOR) 20 MG tablet TAKE 1 TABLET BY MOUTH ONCE DAILY  . escitalopram (LEXAPRO) 10 MG tablet TAKE 1 TABLET BY MOUTH EVERY DAY  . lisinopril-hydrochlorothiazide (PRINZIDE,ZESTORETIC) 20-12.5 MG tablet TAKE 1 TABLET BY MOUTH ONCE DAILY  . Melatonin 2.5 MG CAPS One at bedtime po (Patient taking differently: One at bedtime po prn)  . naproxen (NAPROSYN) 500 MG tablet Take 1 tablet by mouth every 12 (twelve) hours as needed.  Marland Kitchen oxybutynin (DITROPAN-XL) 10 MG 24 hr tablet TAKE 1 TABLET BY MOUTH EVERY DAY  . pneumococcal 13-valent conjugate vaccine (PREVNAR 13) SUSP injection Inject 0.5 mLs into the muscle tomorrow at 10 am for 1 dose.  . tiotropium (SPIRIVA HANDIHALER) 18 MCG inhalation capsule Spiriva with HandiHaler 18 mcg and inhalation capsules  . VITAMIN D PO Take 5,000 mg by mouth. Take 1 daily mon through fri  . [DISCONTINUED] escitalopram (LEXAPRO) 5 MG tablet Take 5 mg by mouth daily.   No facility-administered encounter medications on file as of 10/31/2018.     Activities of Daily Living In your present state of health, do you have any difficulty performing the following activities: 10/31/2018  Hearing? N  Vision? N  Difficulty concentrating or making decisions? N  Walking or climbing stairs? Y  Comment avoid stairs  Dressing or bathing? N  Doing errands, shopping? N  Preparing Food and eating ? N  Using the Toilet? N  In the past six months, have you accidently leaked urine? Y  Comment wears a pad at night  Do you have problems with loss of bowel control? N  Managing your Medications? N  Managing your Finances? N  Housekeeping or managing your Housekeeping? N  Some recent data might be hidden    Patient  Care Team: Dorothyann Peng, MD as PCP - General (Internal Medicine)    Assessment:   This is a routine wellness examination for Katherine Summers.  Exercise Activities and Dietary recommendations Current Exercise Habits: The patient does not participate in regular exercise at present, Exercise limited by: None identified  Goals    . Exercise 150 min/wk Moderate Activity (pt-stated)       Fall Risk Fall Risk  10/31/2018 03/21/2018  Falls in the past year? 1 Yes  Number falls in past yr: 1 2 or more  Comment loss balance -  Injury with Fall? 0 No  Risk Factor Category  - High Fall Risk  Risk for fall due to : History of fall(s);Medication side effect -  Follow up Education provided;Falls prevention discussed -   Is the patient's home free of loose throw rugs in walkways, pet beds, electrical cords, etc?   yes      Grab bars in the bathroom? yes      Handrails on the stairs?   n/a      Adequate lighting?   yes  Timed Get Up and Go performed: n/a  Depression Screen PHQ 2/9 Scores 10/31/2018  PHQ - 2 Score 0  PHQ- 9 Score 0     Cognitive Function     6CIT Screen 10/31/2018  What Year? 0 points  What month? 0 points  What time? 0 points  Count back from 20 0 points  Months in reverse 0 points  Repeat phrase 0 points  Total Score 0    Immunization History  Administered Date(s) Administered  . PPD Test 12/05/2016    Qualifies for Shingles Vaccine? yes  Screening Tests Health Maintenance  Topic Date Due  . FOOT EXAM  04/28/1957  . PNA vac Low Risk Adult (1 of 2 - PCV13) 04/28/2012  . HEMOGLOBIN A1C  04/24/2017  . INFLUENZA VACCINE  12/03/2018 (Originally 04/04/2018)  . OPHTHALMOLOGY EXAM  07/19/2019  . MAMMOGRAM  10/04/2020  . TETANUS/TDAP  07/01/2023  . COLONOSCOPY  08/09/2025  . DEXA SCAN  Completed  . Hepatitis C Screening  Completed    Cancer Screenings: Lung: Low Dose CT Chest recommended if Age 72-80 years, 30 pack-year currently smoking OR have quit w/in 15years.  Patient does not qualify. Breast:  Up to date on Mammogram? Yes   Up to date of Bone Density/Dexa? Yes Colorectal: scheduled  Additional Screenings: : Hepatitis C Screening: 01/02/2014 <0.1     Plan:   Wants to start exercising regularly.  I have personally reviewed and noted the following in the patient's chart:   . Medical and social history . Use of alcohol, tobacco or illicit drugs  . Current medications and supplements . Functional ability and status . Nutritional status . Physical activity . Advanced directives . List of other physicians . Hospitalizations, surgeries, and ER visits in previous 12 months . Vitals . Screenings to include cognitive, depression, and falls . Referrals and appointments  In addition, I have reviewed and discussed with patient certain preventive protocols, quality metrics, and best practice recommendations. A written personalized care plan for preventive services as well as general preventive health recommendations were provided to patient.     Barb Merino, LPN  1/61/0960

## 2018-10-31 NOTE — Addendum Note (Signed)
Addended by: Barb Merino on: 10/31/2018 04:43 PM   Modules accepted: Orders

## 2018-11-01 LAB — HEMOGLOBIN A1C
ESTIMATED AVERAGE GLUCOSE: 126 mg/dL
Hgb A1c MFr Bld: 6 % — ABNORMAL HIGH (ref 4.8–5.6)

## 2018-11-01 LAB — CBC
Hematocrit: 39.9 % (ref 34.0–46.6)
Hemoglobin: 13.9 g/dL (ref 11.1–15.9)
MCH: 29.8 pg (ref 26.6–33.0)
MCHC: 34.8 g/dL (ref 31.5–35.7)
MCV: 86 fL (ref 79–97)
Platelets: 274 10*3/uL (ref 150–450)
RBC: 4.66 x10E6/uL (ref 3.77–5.28)
RDW: 13.2 % (ref 11.7–15.4)
WBC: 10.4 10*3/uL (ref 3.4–10.8)

## 2018-11-01 LAB — CMP14 + ANION GAP
ALT: 22 IU/L (ref 0–32)
AST: 21 IU/L (ref 0–40)
Albumin/Globulin Ratio: 1.5 (ref 1.2–2.2)
Albumin: 4.4 g/dL (ref 3.7–4.7)
Alkaline Phosphatase: 133 IU/L — ABNORMAL HIGH (ref 39–117)
Anion Gap: 16 mmol/L (ref 10.0–18.0)
BUN/Creatinine Ratio: 15 (ref 12–28)
BUN: 12 mg/dL (ref 8–27)
Bilirubin Total: 0.6 mg/dL (ref 0.0–1.2)
CO2: 24 mmol/L (ref 20–29)
Calcium: 10 mg/dL (ref 8.7–10.3)
Chloride: 101 mmol/L (ref 96–106)
Creatinine, Ser: 0.79 mg/dL (ref 0.57–1.00)
GFR calc Af Amer: 87 mL/min/{1.73_m2} (ref >59)
GFR calc non Af Amer: 76 mL/min/{1.73_m2} (ref >59)
GLOBULIN, TOTAL: 2.9 g/dL (ref 1.5–4.5)
Glucose: 95 mg/dL (ref 65–99)
Potassium: 3.8 mmol/L (ref 3.5–5.2)
Sodium: 141 mmol/L (ref 134–144)
Total Protein: 7.3 g/dL (ref 6.0–8.5)

## 2018-11-07 ENCOUNTER — Other Ambulatory Visit: Payer: Self-pay | Admitting: Internal Medicine

## 2018-11-09 ENCOUNTER — Other Ambulatory Visit: Payer: Self-pay | Admitting: Internal Medicine

## 2018-11-09 DIAGNOSIS — R351 Nocturia: Secondary | ICD-10-CM

## 2018-11-12 ENCOUNTER — Encounter: Payer: Self-pay | Admitting: Nurse Practitioner

## 2018-11-20 ENCOUNTER — Encounter: Payer: Self-pay | Admitting: Internal Medicine

## 2019-01-16 ENCOUNTER — Telehealth: Payer: Self-pay | Admitting: Neurology

## 2019-01-16 NOTE — Telephone Encounter (Signed)
Called the patient to discuss upcoming apt on tues 5/19. Wanted to change her to video or telephone visit. We can also push the apt out and make it yearly apt if she is not having any problems with her current machine. If she is doing good and wants to push out apt, it can be made with NP

## 2019-01-17 ENCOUNTER — Encounter: Payer: Self-pay | Admitting: Internal Medicine

## 2019-01-21 ENCOUNTER — Ambulatory Visit: Payer: Medicare Other | Admitting: Neurology

## 2019-02-13 ENCOUNTER — Other Ambulatory Visit: Payer: Self-pay | Admitting: Internal Medicine

## 2019-02-13 DIAGNOSIS — R351 Nocturia: Secondary | ICD-10-CM

## 2019-03-04 ENCOUNTER — Ambulatory Visit (INDEPENDENT_AMBULATORY_CARE_PROVIDER_SITE_OTHER): Payer: Medicare Other | Admitting: Internal Medicine

## 2019-03-04 ENCOUNTER — Other Ambulatory Visit: Payer: Self-pay

## 2019-03-04 ENCOUNTER — Ambulatory Visit: Payer: Self-pay

## 2019-03-04 ENCOUNTER — Encounter: Payer: Self-pay | Admitting: Internal Medicine

## 2019-03-04 VITALS — BP 132/78 | HR 80 | Temp 98.4°F | Ht 65.0 in | Wt 189.4 lb

## 2019-03-04 DIAGNOSIS — M79672 Pain in left foot: Secondary | ICD-10-CM

## 2019-03-04 DIAGNOSIS — M79671 Pain in right foot: Secondary | ICD-10-CM

## 2019-03-04 DIAGNOSIS — R7303 Prediabetes: Secondary | ICD-10-CM | POA: Diagnosis not present

## 2019-03-04 DIAGNOSIS — Z1211 Encounter for screening for malignant neoplasm of colon: Secondary | ICD-10-CM

## 2019-03-04 DIAGNOSIS — B353 Tinea pedis: Secondary | ICD-10-CM

## 2019-03-04 DIAGNOSIS — I10 Essential (primary) hypertension: Secondary | ICD-10-CM | POA: Diagnosis not present

## 2019-03-04 DIAGNOSIS — J449 Chronic obstructive pulmonary disease, unspecified: Secondary | ICD-10-CM

## 2019-03-04 DIAGNOSIS — Z9989 Dependence on other enabling machines and devices: Secondary | ICD-10-CM

## 2019-03-04 DIAGNOSIS — G4733 Obstructive sleep apnea (adult) (pediatric): Secondary | ICD-10-CM

## 2019-03-04 DIAGNOSIS — M7989 Other specified soft tissue disorders: Secondary | ICD-10-CM

## 2019-03-04 DIAGNOSIS — E7800 Pure hypercholesterolemia, unspecified: Secondary | ICD-10-CM

## 2019-03-04 LAB — POCT URINALYSIS DIPSTICK
Bilirubin, UA: NEGATIVE
Glucose, UA: NEGATIVE
Ketones, UA: NEGATIVE
Leukocytes, UA: NEGATIVE
Nitrite, UA: NEGATIVE
Protein, UA: NEGATIVE
Spec Grav, UA: 1.02 (ref 1.010–1.025)
Urobilinogen, UA: 0.2 E.U./dL
pH, UA: 7 (ref 5.0–8.0)

## 2019-03-04 MED ORDER — NYSTATIN 100000 UNIT/GM EX POWD: Freq: Four times a day (QID) | CUTANEOUS | 0 refills | Status: DC

## 2019-03-04 NOTE — Patient Instructions (Signed)

## 2019-03-04 NOTE — Chronic Care Management (AMB) (Signed)
  Chronic Care Management   Outreach Note  03/04/2019 Name: Katherine Summers MRN: [id number] DOB: [birth year]  Referred by: Glendale Chard, MD Reason for referral : Care Coordination   An unsuccessful telephone outreach was attempted today. The patient was referred to the case management team by for assistance with chronic care management and care coordination.   Follow Up Plan: A HIPPA compliant phone message was left for the patient providing contact information and requesting a return call.  The care management team will reach out to the patient again over the next 10 days.   Daneen Schick, BSW, CDP Social Worker, Certified Dementia Practitioner [place] [facility] [place] [facility] [phone]

## 2019-03-05 LAB — HEMOGLOBIN A1C
Est. average glucose Bld gHb Est-mCnc: 126 mg/dL
Hgb A1c MFr Bld: 6 % — ABNORMAL HIGH (ref 4.8–5.6)

## 2019-03-05 LAB — LIPID PANEL
Chol/HDL Ratio: 2.6 ratio (ref 0.0–4.4)
Cholesterol, Total: 135 mg/dL (ref 100–199)
HDL: 52 mg/dL (ref >39)
LDL Calculated: 67 mg/dL (ref 0–99)
Triglycerides: 78 mg/dL (ref 0–149)
VLDL Cholesterol Cal: 16 mg/dL (ref 5–40)

## 2019-03-05 LAB — CMP14+EGFR
ALT: 20 IU/L (ref 0–32)
AST: 24 IU/L (ref 0–40)
Albumin/Globulin Ratio: 1.8 (ref 1.2–2.2)
Albumin: 4.4 g/dL (ref 3.7–4.7)
Alkaline Phosphatase: 113 IU/L (ref 39–117)
BUN/Creatinine Ratio: 16 (ref 12–28)
BUN: 14 mg/dL (ref 8–27)
Bilirubin Total: 0.5 mg/dL (ref 0.0–1.2)
CO2: 22 mmol/L (ref 20–29)
Calcium: 10.2 mg/dL (ref 8.7–10.3)
Chloride: 103 mmol/L (ref 96–106)
Creatinine, Ser: 0.87 mg/dL (ref 0.57–1.00)
GFR calc Af Amer: 78 mL/min/{1.73_m2} (ref >59)
GFR calc non Af Amer: 67 mL/min/{1.73_m2} (ref >59)
Globulin, Total: 2.5 g/dL (ref 1.5–4.5)
Glucose: 95 mg/dL (ref 65–99)
Potassium: 3.8 mmol/L (ref 3.5–5.2)
Sodium: 141 mmol/L (ref 134–144)
Total Protein: 6.9 g/dL (ref 6.0–8.5)

## 2019-03-10 ENCOUNTER — Ambulatory Visit: Payer: Self-pay

## 2019-03-10 NOTE — Chronic Care Management (AMB) (Signed)
  Chronic Care Management   Outreach Note  03/10/2019 Name: Katherine Summers MRN: [id number] DOB: 1946/08/14  Referred by: Glendale Chard, MD Reason for referral : Care Coordination   A second unsuccessful telephone outreach was attempted today. The patient was referred to the case management team for assistance with chronic care management and care coordination.   Follow Up Plan: A HIPPA compliant phone message was left for the patient providing contact information and requesting a return call.  The care management team will reach out to the patient again over the next 10 days.   Daneen Schick, BSW, CDP Social Worker, Certified Dementia Practitioner [place] [facility] [place] [facility] [place] [facility] [phone]

## 2019-03-16 NOTE — Progress Notes (Signed)
Subjective:     Patient ID: Katherine Summers , female    DOB: 1945-10-08 , 73 y.o.   MRN: [id number]   Chief Complaint  Patient presents with  . Hypertension  . Edema    HPI  She is here today for a bp check. She is concerned about LE edema. She denies orthopnea. She admits to using lots of salt on her foods. She denies shortness of breath on exertion.   Hypertension This is a chronic problem. The current episode started more than 1 year ago. The problem has been gradually improving since onset. The problem is controlled. Associated symptoms include peripheral edema. Pertinent negatives include no blurred vision, chest pain, palpitations or shortness of breath. The current treatment provides moderate improvement. Compliance problems include exercise.      Past Medical History:  Diagnosis Date  . Allergy   . CKD (chronic kidney disease) stage 2, GFR 60-89 ml/min   . COPD (chronic obstructive pulmonary disease) ([place])   . Depression 08/01/2016  . Dizziness and giddiness   . Essential hypertension 08/01/2016  . Hyperlipidemia   . Hypertension   . Murmur 08/01/2016  . Pre-diabetes   . Shortness of breath 08/01/2016  . Vitamin D deficiency      Family History  Problem Relation Age of Onset  . Stroke Mother   . Colon cancer Father   . Colon cancer Sister   . Stroke Maternal Grandmother   . Colon cancer Paternal Grandfather      Current Outpatient Medications:  .  amLODipine (NORVASC) 5 MG tablet, TAKE 1 TABLET BY MOUTH EVERY DAY, Disp: 90 tablet, Rfl: 1 .  atorvastatin (LIPITOR) 20 MG tablet, TAKE 1 TABLET BY MOUTH EVERY DAY, Disp: 90 tablet, Rfl: 0 .  escitalopram (LEXAPRO) 10 MG tablet, TAKE 1 TABLET BY MOUTH EVERY DAY, Disp: 90 tablet, Rfl: 1 .  lisinopril-hydrochlorothiazide (ZESTORETIC) 20-12.5 MG tablet, TAKE 1 TABLET BY MOUTH EVERY DAY, Disp: 90 tablet, Rfl: 0 .  Melatonin 2.5 MG CAPS, One at bedtime po (Patient taking differently: One at bedtime po prn), Disp: 30  capsule, Rfl: 0 .  mirabegron ER (MYRBETRIQ) 25 MG TB24 tablet, Take 1 tablet (25 mg total) by mouth daily., Disp: 30 tablet, Rfl: 2 .  naproxen (NAPROSYN) 500 MG tablet, Take 1 tablet by mouth every 12 (twelve) hours as needed., Disp: , Rfl:  .  nystatin (NYSTATIN) powder, Apply topically 4 (four) times daily., Disp: 15 g, Rfl: 0 .  oxybutynin (DITROPAN-XL) 10 MG 24 hr tablet, TAKE 1 TABLET BY MOUTH EVERY DAY, Disp: 90 tablet, Rfl: 0 .  tiotropium (SPIRIVA HANDIHALER) 18 MCG inhalation capsule, Spiriva with HandiHaler 18 mcg and inhalation capsules, Disp: , Rfl:  .  VITAMIN D PO, Take 5,000 mg by mouth. Take 1 daily mon through fri, Disp: , Rfl:    No Known Allergies   Review of Systems  Constitutional: Negative.   Eyes: Negative for blurred vision.  Respiratory: Negative.  Negative for shortness of breath.   Cardiovascular: Negative.  Negative for chest pain and palpitations.  Gastrointestinal: Negative.   Neurological: Negative.   Psychiatric/Behavioral: Negative.      Today's Vitals   03/04/19 0913  BP: 132/78  Pulse: 80  Temp: 98.4 F (36.9 C)  TempSrc: Oral  Weight: 189 lb 6.4 oz (85.9 kg)  Height: '5\' 5"'$  (1.651 m)  PainSc: 0-No pain   Body mass index is 31.52 kg/m.   Objective:  Physical Exam Vitals signs and nursing note  reviewed.  Constitutional:      Appearance: Normal appearance.  HENT:     Head: Normocephalic and atraumatic.  Cardiovascular:     Rate and Rhythm: Normal rate and regular rhythm.     Heart sounds: Normal heart sounds.  Pulmonary:     Effort: Pulmonary effort is normal.     Breath sounds: Normal breath sounds.  Musculoskeletal:     Right lower leg: 1+ Pitting Edema present.     Left lower leg: 1+ Pitting Edema present.  Feet:     Right foot:     Skin integrity: Callus and dry skin present.     Left foot:     Skin integrity: Callus and dry skin present.     Comments: Scaly skin b/l feet, macerated skin between 4th/5th toe on right Skin:     General: Skin is warm.  Neurological:     General: No focal deficit present.     Mental Status: She is alert.  Psychiatric:        Mood and Affect: Mood normal.        Behavior: Behavior normal.         Assessment And Plan:     1. Essential hypertension  Chronic, controlled. She will continue with current meds. She is encouraged to avoid adding salt to her foods. Additionally, she agrees to referral to CCM. Pt advised that they will be able to help with cost of medication if needed, discuss pathophysiology of chronic illnesses and offer community resources to her as needed.   - Referral to Chronic Care Management Services - CMP14+EGFR - POCT Urinalysis Dipstick (81002)  2. Foot swelling  She is encouraged to elevate her legs while seated and to decrease her salt intake. She does admit to liberal use of salt when cooking, seasoning her foods.  3. Prediabetes  HER A1C HAS BEEN ELEVATED IN THE PAST. I WILL CHECK AN A1C, BMET TODAY. SHE WAS ENCOURAGED TO AVOID SUGARY BEVERAGES AND PROCESSED FOODS INCLUDNG BREADS, RICE AND PASTA.  - Hemoglobin A1c  4. Tinea pedis of both feet  She was given rx Nystatin powder to apply to both feet twice daily as needed.   5. OSA on CPAP  Chronic. Importance of compliance to decrease risk of arrhythmia and stroke. She is encouraged to wear at least four hours per night.   - Referral to Chronic Care Management Services  6. Chronic obstructive pulmonary disease, unspecified COPD type (HCC)  Chronic, yet stable. She is encouraged to use inhalers as prescribed.   - Referral to Chronic Care Management Services  7. Pure hypercholesterolemia  Chronic. I will check fasting lipid panel. She is encouraged to avoid fried foods and take meds as prescribed.   - Referral to Chronic Care Management Services - Lipid panel  8. Screen for colon cancer  - Ambulatory referral to Gastroenterology  9. Foot pain, bilateral  She has seen Dr. Sharlet Salina  in the past. She is not yet sure if he is still practicing. She will check on this and get back to me.    Maximino Greenland, MD    THE PATIENT IS ENCOURAGED TO PRACTICE SOCIAL DISTANCING DUE TO THE COVID-19 PANDEMIC.

## 2019-03-18 ENCOUNTER — Ambulatory Visit: Payer: Self-pay

## 2019-03-18 ENCOUNTER — Ambulatory Visit (INDEPENDENT_AMBULATORY_CARE_PROVIDER_SITE_OTHER): Payer: Medicare Other

## 2019-03-18 DIAGNOSIS — I10 Essential (primary) hypertension: Secondary | ICD-10-CM | POA: Diagnosis not present

## 2019-03-18 DIAGNOSIS — R7303 Prediabetes: Secondary | ICD-10-CM

## 2019-03-18 DIAGNOSIS — J449 Chronic obstructive pulmonary disease, unspecified: Secondary | ICD-10-CM | POA: Diagnosis not present

## 2019-03-18 NOTE — Chronic Care Management (AMB) (Signed)
  Chronic Care Management   Initial Visit Note  03/18/2019 Name: Katherine Summers MRN: [id number] DOB: 03/08/1946  Referred by: Glendale Chard, MD Reason for referral : Chronic Care Management (Case Collaboration )   TACARA HADLOCK is a 73 y.o. year old female who is a primary care patient of Glendale Chard, MD. The care management team was consulted for assistance with chronic disease management and care coordination needs.   Review of patient status, including review of consultants reports, relevant laboratory and other test results, and collaboration with appropriate care team members and the patient's provider was performed as part of comprehensive patient evaluation and provision of chronic care management services.    I initiated and established the plan of care for Katherine Summers during one on one collaboration with my clinical care management colleague Daneen Schick BSW who is also engaged with this patient to address social work needs.   Goals Addressed      Patient Stated   . Assist with Chronic Care Management and Care Coordination needs (pt-stated)       Current Barriers:  Katherine Summers Knowledge Barriers related to resources and support available to address needs related to Chronic Care Management and Community Resources  Case Manager Clinical Goal(s):  Katherine Summers Over the next 30 days, patient will work with the CCM team to address needs related to Chronic Disease Management and Medication management.   Interventions:  . Collaborated with BSW and initiated plan of care to address needs related to Chronic disease management and Medication management specifically for financial assistance with Spiriva  Patient Self Care Activities:  . Self administers medications as prescribed . Attends all scheduled provider appointments . Calls pharmacy for medication refills . Performs ADL's independently . Performs IADL's independently . Calls provider office for new concerns or questions  Initial goal  documentation        Telephone follow up appointment with care management team member scheduled for: 04/04/19  Barb Merino, RN, BSN, CCM Care Management Coordinator [place] [facility]  Direct Phone: [phone]

## 2019-03-18 NOTE — Patient Instructions (Signed)
Social Worker Visit Information  Goals we discussed today:  Goals Addressed            This Visit's Progress     Patient Stated   . "I fall a lot" (pt-stated)       Current Barriers:  . "stiffness" reported after lack of exercise since knee replacement "two years ago" . Lives alone . Inconsistent use of cane  Clinical Social Work Clinical Goal(s):  Marland Kitchen Over the next 60 days, patient will work with embedded CCM team to address needs related to fall intervention  CCM SW Interventions: Completed 03/18/2019 . Patient interviewed and appropriate assessments performed - the patient reports she lives alone and falls often.  . Determined the patient last fell "two days ago" . Assessed for assistive devices used in the home - the patient reports she has a cane which she uses while out of the home. The patient indicates she has just started using a cane inside of her home after her children have encouraged her to . The patient denies ownership of a life alert and does report at times she must call someone to help her get off the floor . Advised patient to keep her phone on her at all times in the event she falls and needs help . Educated the patient on options to request orders for home health PT to assist with strength and balancing - the patient declines this option today due to rising concerns with COVID 19 . Advised the patient embedded RN Case Manager Glenard Haring Little would follow up with her at a later date to complete a telephonic fall risk assessment  . Collaborated with RN Case Manager re: patient enrollment and stated goal  Patient Self Care Activities:  . Self administers medications as prescribed . Attends all scheduled provider appointments . Calls provider office for new concerns or questions  Initial goal documentation     . "My spiriva is expensive" (pt-stated)       Current Barriers:  . Financial constraints  Clinical Social Work Clinical Goal(s):  Marland Kitchen Over the next 45  days, patient will work with embedded PharmD to address needs related to medication assistance  Interventions: . Patient interviewed and appropriate assessments performed . Provided patient with information about chronic care management  . Discussed plans with patient for ongoing care management follow up and provided patient with direct contact information for care management team . Collaboration with embedded PharmD Lottie Dawson to communicate patients enrollment and goal  Patient Self Care Activities:  . Self administers medications as prescribed . Calls pharmacy for medication refills . Performs ADL's independently  Initial goal documentation         Materials provided: Verbal education about CCM program provided by phone  Ms. Fasig was given information about Chronic Care Management services today including:  1. CCM service includes personalized support from designated clinical staff supervised by her physician, including individualized plan of care and coordination with other care providers 2. 24/7 contact phone numbers for assistance for urgent and routine care needs. 3. Service will only be billed when office clinical staff spend 20 minutes or more in a month to coordinate care. 4. Only one practitioner may furnish and bill the service in a calendar month. 5. The patient may stop CCM services at any time (effective at the end of the month) by phone call to the office staff. 6. The patient will be responsible for cost sharing (co-pay) of up to 20% of the service fee (  after annual deductible is met).  Patient agreed to services and verbal consent obtained.   The patient verbalized understanding of instructions provided today and declined a print copy of patient instruction materials.   Follow up plan: No planned SW follow up at this time. Please call me for any future resource needs.   Daneen Schick, BSW, CDP Social Worker, Certified Dementia Practitioner [place] [facility] [place]  [facility] [phone]

## 2019-03-18 NOTE — Chronic Care Management (AMB) (Signed)
Chronic Care Management   Social Work General Note  03/18/2019 Name: Katherine Summers MRN: [id number] DOB: 12/16/1945  Katherine Summers is a 73 y.o. year old female who is a primary care patient of Glendale Chard, MD. The CCM was consulted to assist the patient with chronic care management.  Katherine Summers was given information about Chronic Care Management services today including:  1. CCM service includes personalized support from designated clinical staff supervised by her physician, including individualized plan of care and coordination with other care providers 2. 24/7 contact phone numbers for assistance for urgent and routine care needs. 3. Service will only be billed when office clinical staff spend 20 minutes or more in a month to coordinate care. 4. Only one practitioner may furnish and bill the service in a calendar month. 5. The patient may stop CCM services at any time (effective at the end of the month) by phone call to the office staff. 6. The patient will be responsible for cost sharing (co-pay) of up to 20% of the service fee (after annual deductible is met).  Patient agreed to services and verbal consent obtained.   Review of patient status, including review of consultants reports, relevant laboratory and other test results, and collaboration with appropriate care team members and the patient's provider was performed as part of comprehensive patient evaluation and provision of chronic care management services.    SDOH (Social Determinants of Health) screening performed today. The patient denies any current SDOH challenges. 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 The patient reports having and Advance Directive. CCM SW encouraged the patient to provide a copy to her health care provider during her next scheduled visit.  The patient indicates concern with falling as well as medication costs. See care plan below:  Goals Addressed            This Visit's  Progress     Patient Stated   . "I fall a lot" (pt-stated)       Current Barriers:  . "stiffness" reported after lack of exercise since knee replacement "two years ago" . Lives alone . Inconsistent use of cane  Clinical Social Work Clinical Goal(s):  Marland Kitchen Over the next 60 days, patient will work with embedded CCM team to address needs related to fall intervention  CCM SW Interventions: Completed 03/18/2019 . Patient interviewed and appropriate assessments performed - the patient reports she lives alone and falls often.  . Determined the patient last fell "two days ago" . Assessed for assistive devices used in the home - the patient reports she has a cane which she uses while out of the home. The patient indicates she has just started using a cane inside of her home after her children have encouraged her to . The patient denies ownership of a life alert and does report at times she must call someone to help her get off the floor . Advised patient to keep her phone on her at all times in the event she falls and needs help . Educated the patient on options to request orders for home health PT to assist with strength and balancing - the patient declines this option today due to rising concerns with COVID 19 . Advised the patient embedded RN Case Manager Glenard Haring [place] would follow up with her at a later date to complete a telephonic fall risk assessment  . Collaborated with RN Case Manager re: patient enrollment and stated goal  Patient Self Care Activities:  .  Self administers medications as prescribed . Attends all scheduled provider appointments . Calls provider office for new concerns or questions  Initial goal documentation     . "My spiriva is expensive" (pt-stated)       Current Barriers:  . Financial constraints  Clinical Social Work Clinical Goal(s):  Marland Kitchen Over the next 45 days, patient will work with embedded PharmD to address needs related to medication assistance  Interventions:  . Patient interviewed and appropriate assessments performed . Provided patient with information about chronic care management  . Discussed plans with patient for ongoing care management follow up and provided patient with direct contact information for care management team . Collaboration with embedded PharmD Lottie Dawson to communicate patients enrollment and goal  Patient Self Care Activities:  . Self administers medications as prescribed . Calls pharmacy for medication refills . Performs ADL's independently  Initial goal documentation         Follow Up Plan: No CCM SW follow up planned at this time. The patient will be outreached by other members of the CCM team over the next 14-21 days.       Daneen Schick, BSW, CDP Social Worker, Certified Dementia Practitioner [place] [facility] [phone]  Total time spent performing care coordination and/or care management activities with the patient by phone or face to face = 18 minutes.

## 2019-03-24 ENCOUNTER — Telehealth: Payer: Self-pay

## 2019-03-26 ENCOUNTER — Telehealth: Payer: Self-pay

## 2019-04-04 ENCOUNTER — Telehealth: Payer: Self-pay

## 2019-04-15 ENCOUNTER — Ambulatory Visit: Payer: Self-pay | Admitting: Pharmacist

## 2019-04-15 NOTE — Progress Notes (Signed)
  Chronic Care Management   Outreach Note  04/15/2019 Name: Katherine Summers MRN: [id number] DOB: [birth year]  Referred by: Glendale Chard, MD Reason for referral : Chronic Care Management   An unsuccessful telephone outreach was attempted today. The patient was referred to the case management team by for assistance with chronic care management and care coordination.   Follow Up Plan: A HIPPA compliant phone message was left for the patient providing contact information and requesting a return call.  The care management team will reach out to the patient again over the next 5-7 business days.    Regina Eck, PharmD, BCPS Clinical Pharmacist, [facility] [place]: [phone]

## 2019-04-17 ENCOUNTER — Ambulatory Visit (INDEPENDENT_AMBULATORY_CARE_PROVIDER_SITE_OTHER): Payer: Medicare Other | Admitting: Pharmacist

## 2019-04-17 DIAGNOSIS — J449 Chronic obstructive pulmonary disease, unspecified: Secondary | ICD-10-CM | POA: Diagnosis not present

## 2019-04-17 DIAGNOSIS — I10 Essential (primary) hypertension: Secondary | ICD-10-CM

## 2019-04-17 DIAGNOSIS — R7303 Prediabetes: Secondary | ICD-10-CM

## 2019-04-18 ENCOUNTER — Other Ambulatory Visit: Payer: Self-pay | Admitting: Pharmacy Technician

## 2019-04-18 NOTE — Patient Outreach (Signed)
[facility]) Care Management  04/18/2019  Katherine Summers 1945-12-09 [id number]                          Medication Assistance Referral  Referral From: [facility] Jenne Pane [facility])  Medication/Company: [employer] / [employer] Patient application portion:  Mailed Provider application portion: Faxed  to Dr. Baird [facility]   Follow up:  Will follow up with patient in 7-10 business days to confirm application(s) have been received.  Maud Deed Chana Bode [place] Certified Pharmacy Technician [place] [facility] Direct Dial:[phone]

## 2019-04-22 ENCOUNTER — Telehealth: Payer: Self-pay

## 2019-04-22 NOTE — Progress Notes (Signed)
Chronic Care Management   Initial Visit Note  04/17/2019 Name: Katherine Summers MRN: [id number] DOB: [birth year]  Referred by: Glendale Chard, MD Reason for referral : Chronic Care Management   Katherine Summers is a 73 y.o. year old female who is a primary care patient of Glendale Chard, MD. The CCM team was consulted for assistance with chronic disease management and care coordination needs.   Review of patient status, including review of consultants reports, relevant laboratory and other test results, and collaboration with appropriate care team members and the patient's provider was performed as part of comprehensive patient evaluation and provision of chronic care management services.    I spoke with Katherine Summers by telephone today.  Medications: Outpatient Encounter Medications as of 04/17/2019  Medication Sig Note  . amLODipine (NORVASC) 5 MG tablet TAKE 1 TABLET BY MOUTH EVERY DAY   . atorvastatin (LIPITOR) 20 MG tablet TAKE 1 TABLET BY MOUTH EVERY DAY   . escitalopram (LEXAPRO) 10 MG tablet TAKE 1 TABLET BY MOUTH EVERY DAY   . lisinopril-hydrochlorothiazide (ZESTORETIC) 20-12.5 MG tablet TAKE 1 TABLET BY MOUTH EVERY DAY   . naproxen (NAPROSYN) 500 MG tablet Take 1 tablet by mouth every 12 (twelve) hours as needed.   . nystatin (NYSTATIN) powder Apply topically 4 (four) times daily.   Marland Kitchen oxybutynin (DITROPAN-XL) 10 MG 24 hr tablet TAKE 1 TABLET BY MOUTH EVERY DAY   . VITAMIN D PO Take 5,000 mg by mouth. Take 1 daily mon through fri   . Melatonin 2.5 MG CAPS One at bedtime po (Patient taking differently: One at bedtime po prn)   . mirabegron ER (MYRBETRIQ) 25 MG TB24 tablet Take 1 tablet (25 mg total) by mouth daily. (Patient not taking: Reported on 04/22/2019) 04/22/2019: Patient states this is not working for her   . tiotropium (SPIRIVA HANDIHALER) 18 MCG inhalation capsule Spiriva with HandiHaler 18 mcg and inhalation capsules    No facility-administered encounter medications on file as of  04/17/2019.      Objective:   Goals Addressed            This Visit's Progress     Patient Stated   . "My spiriva is expensive" (pt-stated)       Current Barriers:  . Financial Barriers: patient has [employer] and reports copay for Spiriva is cost prohibitive at this time  Pharmacist Clinical Goal(s):  Marland Kitchen Over the next 30 days, patient will work with PharmD and providers to relieve medication access concerns  Interventions: . Comprehensive medication review completed; medication list updated in electronic medical record.  Patient has not used Spiriva in~52months due to cost.  She reports her breathing is "okay", however it would be best controlled on Spiriva daily maintenance therapy. Marland Kitchen Spiriva by [employer]: Patient meets income/out of pocket spend criteria for this medication's patient assistance program. Reviewed application process. Patient will provide proof of income, out of pocket spend report, and will sign application. Will collaborate with primary care provider, Dr. Glendale Chard for their portion of application. Once completed, will submit to Spiriva patient assistance program.  Patient Self Care Activities:  . Patient will provide necessary portions of application    Initial goal documentation    . My bladder medication is not working (pt-stated)       Current Barriers:  . Patient states her current bladder control medication (Myrbetriq) is not working.  Pharmacist Clinical Goal(s):  Marland Kitchen Over the next 45 days, patient will work with PharmD and  provider towards optimized medication management  Interventions: . Comprehensive medication review performed; medication list updated in electronic medical record . Patient states her samples of Myrbetriq are not working for her bladder control.  She is currently on the 25mg  daily dosing.  Will discuss with PCP if possible to increased to 50mg  daily for better control vs. Therapeutic alternative.  . Patient denies  side effects with this medication.  She reports tolerating it well.    Patient Self Care Activities:  . Patient will take medications as prescribed . Patient will inform PCP office of PharmD if additional options for bladder control are needed.  Initial goal documentation         Plan:   The care management team will reach out to the patient again over the next 3-4 weeks.  Kieth BrightlyJulie Dattero Fredrick Dray, PharmD, BCPS Clinical Pharmacist, [facility]  [facility]  Direct Dial: [phone]

## 2019-04-22 NOTE — Patient Instructions (Addendum)
Visit Information  Goals Addressed            This Visit's Progress     Patient Stated   . "My spiriva is expensive" (pt-stated)       Current Barriers:  . Financial Barriers: patient has [employer] and reports copay for Spiriva is cost prohibitive at this time  Pharmacist Clinical Goal(s):  Marland Kitchen Over the next 30 days, patient will work with PharmD and providers to relieve medication access concerns  Interventions: . Comprehensive medication review completed; medication list updated in electronic medical record.  Patient has not used Spiriva in~71months due to cost.  She reports her breathing is "okay", however it would be best controlled on Spiriva daily maintenance therapy. Marland Kitchen Spiriva by [employer]: Patient meets income/out of pocket spend criteria for this medication's patient assistance program. Reviewed application process. Patient will provide proof of income, out of pocket spend report, and will sign application. Will collaborate with primary care provider, Dr. Glendale Chard for their portion of application. Once completed, will submit to Spiriva patient assistance program.  Patient Self Care Activities:  . Patient will provide necessary portions of application    Initial goal documentation    . My bladder medication is not working (pt-stated)       Current Barriers:  . Patient states her current bladder control medication (Myrbetriq) is not working.  Pharmacist Clinical Goal(s):  Marland Kitchen Over the next 45 days, patient will work with PharmD and provider towards optimized medication management  Interventions: . Comprehensive medication review performed; medication list updated in electronic medical record . Patient states her samples of Myrbetriq are not working for her bladder control.  She is currently on the 25mg  daily dosing.  Will discuss with PCP if possible to increased to 50mg  daily for better control vs. Therapeutic alternative.  . Patient denies side effects  with this medication.  She reports tolerating it well.    Patient Self Care Activities:  . Patient will take medications as prescribed . Patient will inform PCP office of PharmD if additional options for bladder control are needed.  Initial goal documentation        The patient verbalized understanding of instructions provided today and declined a print copy of patient instruction materials.   The care management team will reach out to the patient again over the next 3-4 weeks.  Regina Eck, PharmD, BCPS Clinical Pharmacist, [facility] [place]: [phone]

## 2019-04-29 ENCOUNTER — Telehealth: Payer: Self-pay

## 2019-04-29 NOTE — Telephone Encounter (Signed)
-----   Message from Glendale Chard, MD sent at 04/28/2019  1:27 PM EDT ----- Regarding: FW: bladder control Pls check to see if we have 50mg  Mybetriq. If yes, pls call pt to pick up samples. 3 boxes should suffice. Thanks. She needs to call to let us know if this is helpful so we can send rx. This is for overactive bladder.  ----- Message ----- From: Lavera Guise, [facility] Sent: 04/22/2019  10:22 AM EDT To: Glendale Chard, MD Subject: bladder control                                Hi!  Patient states she has not received relief from Myrbetriq 25mg  daily (appears this was started in Feb 2020 by Doreene Burke).  I don't have a lot of experience with titrating this medication, but was curious if you had any luck with the Myrbetriq 50mg  daily dose.  Patient states Ditropan XL did not work for her in the past.  I'm happy to tell patient to make an appt if needed!   Thank you! Almyra Free

## 2019-04-29 NOTE — Telephone Encounter (Signed)
Pt.notified

## 2019-05-01 ENCOUNTER — Telehealth: Payer: Self-pay

## 2019-05-02 ENCOUNTER — Telehealth: Payer: Self-pay

## 2019-05-05 ENCOUNTER — Other Ambulatory Visit: Payer: Self-pay | Admitting: Internal Medicine

## 2019-05-08 ENCOUNTER — Ambulatory Visit: Payer: Self-pay | Admitting: Pharmacist

## 2019-05-08 DIAGNOSIS — R7303 Prediabetes: Secondary | ICD-10-CM

## 2019-05-08 DIAGNOSIS — I10 Essential (primary) hypertension: Secondary | ICD-10-CM

## 2019-05-08 DIAGNOSIS — J449 Chronic obstructive pulmonary disease, unspecified: Secondary | ICD-10-CM

## 2019-05-08 NOTE — Progress Notes (Signed)
  Chronic Care Management   Outreach Note  05/08/2019 Name: Katherine Summers MRN: [id number] DOB: 1946-02-09  Referred by: Glendale Chard, MD Reason for referral : Chronic Care Management   An unsuccessful telephone outreach was attempted today. The patient was referred to the case management team by for assistance with chronic care management and care coordination.   Follow Up Plan: A HIPPA compliant phone message was left for the patient providing contact information and requesting a return call.  The care management team will reach out to the patient again over the next 5-7 business days.   Regina Eck, PharmD, BCPS Clinical Pharmacist, [facility] [place]: [phone]

## 2019-05-13 ENCOUNTER — Other Ambulatory Visit: Payer: Self-pay

## 2019-05-13 DIAGNOSIS — R351 Nocturia: Secondary | ICD-10-CM

## 2019-05-13 MED ORDER — OXYBUTYNIN CHLORIDE ER 10 MG PO TB24: 10.0000 mg | ORAL_TABLET | Freq: Every day | ORAL | 2 refills | Status: DC

## 2019-05-13 MED ORDER — LISINOPRIL-HYDROCHLOROTHIAZIDE 20-12.5 MG PO TABS: 1.0000 | ORAL_TABLET | Freq: Every day | ORAL | 2 refills | Status: DC

## 2019-05-14 ENCOUNTER — Ambulatory Visit: Payer: Self-pay | Admitting: Pharmacist

## 2019-05-14 DIAGNOSIS — J449 Chronic obstructive pulmonary disease, unspecified: Secondary | ICD-10-CM

## 2019-05-14 DIAGNOSIS — R7303 Prediabetes: Secondary | ICD-10-CM

## 2019-05-14 DIAGNOSIS — I10 Essential (primary) hypertension: Secondary | ICD-10-CM

## 2019-05-14 NOTE — Progress Notes (Signed)
  Chronic Care Management   Outreach Note  05/14/2019 Name: Katherine Summers MRN: [id number] DOB: [birth year]  Referred by: Glendale Chard, MD Reason for referral : Chronic Care Management   An unsuccessful telephone outreach was attempted today. The patient was referred to the case management team by for assistance with chronic care management and care coordination.   Follow Up Plan: A HIPPA compliant phone message was left for the patient providing contact information and requesting a return call.  The care management team will reach out to the patient again over the next 5-7 business days.   Regina Eck, PharmD, BCPS Clinical Pharmacist, [facility] [place]: [phone]

## 2019-05-15 ENCOUNTER — Telehealth: Payer: Self-pay

## 2019-05-21 ENCOUNTER — Ambulatory Visit: Payer: Self-pay | Admitting: Pharmacist

## 2019-05-21 DIAGNOSIS — I10 Essential (primary) hypertension: Secondary | ICD-10-CM

## 2019-05-21 DIAGNOSIS — J449 Chronic obstructive pulmonary disease, unspecified: Secondary | ICD-10-CM

## 2019-05-21 DIAGNOSIS — R350 Frequency of micturition: Secondary | ICD-10-CM

## 2019-05-23 ENCOUNTER — Telehealth: Payer: Self-pay

## 2019-05-23 ENCOUNTER — Other Ambulatory Visit: Payer: Self-pay | Admitting: Pharmacy Technician

## 2019-05-23 NOTE — Patient Outreach (Signed)
[facility]) Care Management  05/23/2019  AYAKA ANDES 05-02-1946 [id number]   Received provider portion(s) of patient assistance application(s) for Spiriva. Faxed completed application and required documents into [employer].  Will follow up with company(ies) in 5-7 business days to check status of application(s).  Maud Deed Chana Bode [place] Certified Pharmacy Technician [place] [facility] Direct Dial:[phone]

## 2019-05-25 NOTE — Patient Instructions (Signed)
Visit Information  Goals Addressed            This Visit's Progress     Patient Stated   . "My spiriva is expensive" (pt-stated)       Current Barriers:  . Financial Barriers: patient has [employer] and reports copay for Spiriva is cost prohibitive at this time  Pharmacist Clinical Goal(s):  Marland Kitchen Over the next 45 days, patient will work with PharmD and providers to relieve medication access concerns  Interventions: . Comprehensive medication review completed; medication list updated in electronic medical record.  Patient has not used Spiriva in ~40months due to cost.  She reports her breathing is "okay", however it would be best controlled on Spiriva daily maintenance therapy.   Marland Kitchen Spiriva by [employer]: Patient meets income/out of pocket spend criteria for this medication's patient assistance program. Reviewed application process. Patient will provide proof of income, out of pocket spend report, and will sign application. Will collaborate with primary care provider, Dr. Glendale Chard for their portion of application.  . Patient provided completed application for patient assistance program.  Application submitted to [place] patient assistance program.  We will continue to follow.  Patient assistance program follow up in 10-14 business days.  Patient Self Care Activities:  . Patient will provide necessary portions of application    Please see past updates related to this goal by clicking on the "Past Updates" button in the selected goal     . My bladder medication is not working (pt-stated)       Current Barriers:  . Patient states her current bladder control medication (Myrbetriq) is not working.  Pharmacist Clinical Goal(s):  Marland Kitchen Over the next 90 days, patient will work with PharmD and provider towards optimized medication management  Interventions: Completed visit & telephone call with patient on 05/21/19 . Comprehensive medication review performed;  medication list updated in electronic medical record . Patient states her samples of Myrbetriq 50mg  daily have started working, however she is experiencing some urinary leakage.  Patients states Myrbetriq has helped more than any other medication for her incontinence.  She would like to continue on Myrbetriq for now.  She will continue on Myrbetriq 50mg  daily dosing.  Samples provided today at PCP office.  Patient states she cannot afford Mybetriq.  Unfortunately, the manufacturer does not offer patient assistance to patient's who have insurance that will pay for it (despite copay amount).    . She has tried and failed oxybutinin IR and XL in the past. . Patient denies side effects with this medication.  She reports tolerating it well.    Patient Self Care Activities:  . Patient will take medications as prescribed . Patient will inform PCP office of PharmD if additional options for bladder control are needed.  Please see past updates related to this goal by clicking on the "Past Updates" button in the selected goal         The patient verbalized understanding of instructions provided today and declined a print copy of patient instruction materials.   The care management team will reach out to the patient again over the next 6-8 weeks.  Regina Eck, PharmD, BCPS Clinical Pharmacist, [facility] [place]: [phone]

## 2019-05-25 NOTE — Progress Notes (Signed)
Chronic Care Management   Visit Note  05/21/2019 Name: Katherine Summers MRN: [id number] DOB: 09-13-1945  Referred by: Glendale Chard, MD Reason for referral : Chronic Care Management   Katherine Summers is a 73 y.o. year old female who is a primary care patient of Glendale Chard, MD. The CCM team was consulted for assistance with chronic disease management and care coordination needs.   Review of patient status, including review of consultants reports, relevant laboratory and other test results, and collaboration with appropriate care team members and the patient's provider was performed as part of comprehensive patient evaluation and provision of chronic care management services.    I met with [name] today in clinic and also completed telephone call for follow up information.  Advanced Directives Status: N See Care Plan and Vynca application for related entries.   Medications: Outpatient Encounter Medications as of 05/21/2019  Medication Sig Note  . amLODipine (NORVASC) 5 MG tablet TAKE 1 TABLET BY MOUTH EVERY DAY   . atorvastatin (LIPITOR) 20 MG tablet TAKE 1 TABLET BY MOUTH EVERY DAY   . escitalopram (LEXAPRO) 10 MG tablet TAKE 1 TABLET BY MOUTH EVERY DAY   . lisinopril-hydrochlorothiazide (ZESTORETIC) 20-12.5 MG tablet Take 1 tablet by mouth daily.   . Melatonin 2.5 MG CAPS One at bedtime po (Patient taking differently: One at bedtime po prn)   . mirabegron ER (MYRBETRIQ) 25 MG TB24 tablet Take 1 tablet (25 mg total) by mouth daily. (Patient not taking: Reported on 04/22/2019) 04/22/2019: Patient states this is not working for her   . mirabegron ER (MYRBETRIQ) 50 MG TB24 tablet Take 50 mg by mouth daily.   . naproxen (NAPROSYN) 500 MG tablet Take 1 tablet by mouth every 12 (twelve) hours as needed.   . nystatin (NYSTATIN) powder Apply topically 4 (four) times daily.   Marland Kitchen tiotropium (SPIRIVA HANDIHALER) 18 MCG inhalation capsule Spiriva with HandiHaler 18 mcg and inhalation capsules   .  VITAMIN D PO Take 5,000 mg by mouth. Take 1 daily mon through fri   . [DISCONTINUED] oxybutynin (DITROPAN-XL) 10 MG 24 hr tablet Take 1 tablet (10 mg total) by mouth daily.    No facility-administered encounter medications on file as of 05/21/2019.      Objective:   Goals Addressed            This Visit's Progress     Patient Stated   . "My spiriva is expensive" (pt-stated)       Current Barriers:  . Financial Barriers: patient has [employer] and reports copay for Spiriva is cost prohibitive at this time  Pharmacist Clinical Goal(s):  Marland Kitchen Over the next 45 days, patient will work with PharmD and providers to relieve medication access concerns  Interventions: . Comprehensive medication review completed; medication list updated in electronic medical record.  Patient has not used Spiriva in ~63month due to cost.  She reports her breathing is "okay", however it would be best controlled on Spiriva daily maintenance therapy.   .Marland KitchenSpiriva by [employer]: Patient meets income/out of pocket spend criteria for this medication's patient assistance program. Reviewed application process. Patient will provide proof of income, out of pocket spend report, and will sign application. Will collaborate with primary care provider, Dr. RGlendale Chardfor their portion of application.  . Patient provided completed application for patient assistance program.  [place]  We will continue to follow.  Patient assistance program follow up in 10-14  business days.  Patient Self Care Activities:  . Patient will provide necessary portions of application    Please see past updates related to this goal by clicking on the "Past Updates" button in the selected goal     . My bladder medication is not working (pt-stated)       Current Barriers:  . Patient states her current bladder control medication (Myrbetriq) is not working.  Pharmacist  Clinical Goal(s):  Marland Kitchen Over the next 90 days, patient will work with PharmD and provider towards optimized medication management  Interventions: Completed visit & telephone call with patient on 05/21/19 . Comprehensive medication review performed; medication list updated in electronic medical record . Patient states her samples of Myrbetriq '50mg'$  daily have started working, however she is experiencing some urinary leakage.  Patients states Myrbetriq has helped more than any other medication for her incontinence.  She would like to continue on Myrbetriq for now.  She will continue on Myrbetriq '50mg'$  daily dosing.  Samples provided today at PCP office.  Patient states she cannot afford Mybetriq.  Unfortunately, the manufacturer does not offer patient assistance to patient's who have insurance that will pay for it (despite copay amount).    . She has tried and failed oxybutinin IR and XL in the past. . Patient denies side effects with this medication.  She reports tolerating it well.    Patient Self Care Activities:  . Patient will take medications as prescribed . Patient will inform PCP office of PharmD if additional options for bladder control are needed.  Please see past updates related to this goal by clicking on the "Past Updates" button in the selected goal           Plan:   The care management team will reach out to the patient again over the next 6-8 weeks.  Regina Eck, PharmD, BCPS Clinical Pharmacist, [facility] [place]: [phone]

## 2019-06-09 ENCOUNTER — Telehealth: Payer: Self-pay

## 2019-06-09 ENCOUNTER — Ambulatory Visit (INDEPENDENT_AMBULATORY_CARE_PROVIDER_SITE_OTHER): Payer: Medicare Other | Admitting: Pharmacist

## 2019-06-09 DIAGNOSIS — R350 Frequency of micturition: Secondary | ICD-10-CM

## 2019-06-09 DIAGNOSIS — I10 Essential (primary) hypertension: Secondary | ICD-10-CM | POA: Diagnosis not present

## 2019-06-09 DIAGNOSIS — J449 Chronic obstructive pulmonary disease, unspecified: Secondary | ICD-10-CM | POA: Diagnosis not present

## 2019-06-10 NOTE — Patient Instructions (Signed)
Visit Information  Goals Addressed            This Visit's Progress     Patient Stated   . COMPLETED: "My spiriva is expensive" (pt-stated)       Current Barriers:  . Financial Barriers: patient has [employer] and reports copay for Spiriva is cost prohibitive at this time  Pharmacist Clinical Goal(s):  Marland Kitchen Over the next 45 days, patient will work with PharmD and providers to relieve medication access concerns  Interventions: . Comprehensive medication review completed; medication list updated in electronic medical record.  Patient has not used Spiriva in ~51months due to cost.  She reports her breathing is "okay", however it would be best controlled on Spiriva daily maintenance therapy.   Marland Kitchen Spiriva by [employer]: Patient meets income/out of pocket spend criteria for this medication's patient assistance program. Reviewed application process. Patient will provide proof of income, out of pocket spend report, and will sign application. Will collaborate with primary care provider, Dr. Glendale Chard for their portion of application.  . Patient provided completed application for patient assistance program.  Application submitted to [place] patient assistance program.  We will continue to follow.  Patient assistance program follow up in 10-14 business days. . Patient denied by patient assistance program due to financial reasons.  Will close this go.  Patient Self Care Activities:  . Patient will provide necessary portions of application    Please see past updates related to this goal by clicking on the "Past Updates" button in the selected goal     . My bladder medication is not working (pt-stated)       Current Barriers:  . Patient states her current bladder control medication (Myrbetriq) is not working.  Pharmacist Clinical Goal(s):  Marland Kitchen Over the next 90 days, patient will work with PharmD and provider towards optimized medication  management  Interventions: Completed visit & telephone call with patient on 06/09/19 . Comprehensive medication review performed; medication list updated in electronic medical record . Patient states her samples of Myrbetriq 50mg  daily have started working, however she is experiencing some urinary leakage.  Patients states Myrbetriq has helped more than any other medication for her incontinence.  She would like to continue on Myrbetriq for now.  She will continue on Myrbetriq 50mg  daily dosing.  Encouraged PT. Samples provided today (06/09/19) at PCP office.  Patient states she cannot afford Mybetriq.  Unfortunately, the manufacturer does not offer patient assistance to patient's who have insurance that will pay for it (despite copay amount).    . She has tried and failed oxybutinin IR and XL in the past. . Patient denies side effects with this medication.  She reports tolerating it well.    Patient Self Care Activities:  . Patient will take medications as prescribed . Patient will inform PCP office of PharmD if additional options for bladder control are needed.  Please see past updates related to this goal by clicking on the "Past Updates" button in the selected goal         The patient verbalized understanding of instructions provided today and declined a print copy of patient instruction materials.   The care management team will reach out to the patient again over the next 4-6 weeks.  Regina Eck, PharmD, BCPS Clinical Pharmacist, [facility] [place]: [phone]

## 2019-06-10 NOTE — Progress Notes (Signed)
Chronic Care Management   Visit Note  06/09/2019 Name: Katherine Summers MRN: [id number] DOB: Dec 11, 1945  Referred by: Glendale Chard, MD Reason for referral : Chronic Care Management   Katherine Summers is a 73 y.o. year old female who is a primary care patient of Glendale Chard, MD. 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    I spoke with [name] by telephone today.  Advanced Directives Status: N See Care Plan and Vynca application for related entries.   Medications: Outpatient Encounter Medications as of 06/09/2019  Medication Sig Note  . amLODipine (NORVASC) 5 MG tablet TAKE 1 TABLET BY MOUTH EVERY DAY   . atorvastatin (LIPITOR) 20 MG tablet TAKE 1 TABLET BY MOUTH EVERY DAY   . escitalopram (LEXAPRO) 10 MG tablet TAKE 1 TABLET BY MOUTH EVERY DAY   . lisinopril-hydrochlorothiazide (ZESTORETIC) 20-12.5 MG tablet Take 1 tablet by mouth daily.   . Melatonin 2.5 MG CAPS One at bedtime po (Patient taking differently: One at bedtime po prn)   . mirabegron ER (MYRBETRIQ) 50 MG TB24 tablet Take 50 mg by mouth daily.   . naproxen (NAPROSYN) 500 MG tablet Take 1 tablet by mouth every 12 (twelve) hours as needed.   . nystatin (NYSTATIN) powder Apply topically 4 (four) times daily.   Marland Kitchen tiotropium (SPIRIVA HANDIHALER) 18 MCG inhalation capsule Spiriva with HandiHaler 18 mcg and inhalation capsules   . VITAMIN D PO Take 5,000 mg by mouth. Take 1 daily mon through fri   . [DISCONTINUED] mirabegron ER (MYRBETRIQ) 25 MG TB24 tablet Take 1 tablet (25 mg total) by mouth daily. (Patient not taking: Reported on 04/22/2019)  04/22/2019: Patient states this is not working for her    No facility-administered encounter medications on file as of 06/09/2019.      Objective:   Goals Addressed            This Visit's Progress     Patient Stated   . COMPLETED: "My spiriva is expensive" (pt-stated)       Current Barriers:  . Financial Barriers: patient has [employer] and reports copay for Spiriva is cost prohibitive at this time  Pharmacist Clinical Goal(s):  Marland Kitchen Over the next 45 days, patient will work with PharmD and providers to relieve medication access concerns  Interventions: . Comprehensive medication review completed; medication list updated in electronic medical record.  Patient has not used Spiriva in ~42months due to cost.  She reports her breathing is "okay", however it would be best controlled on Spiriva daily maintenance therapy.   Marland Kitchen Spiriva by [employer]: Patient meets income/out of pocket spend criteria for this medication's patient assistance program. Reviewed application process. Patient will provide proof of income, out of pocket spend report, and will sign application. Will collaborate with primary care provider, Dr. Glendale Chard for their portion of application.  . Patient provided completed application for patient assistance program.  Application submitted to [place] patient assistance program.  We will continue to follow.  Patient assistance program follow up in 10-14 business days. . Patient denied by patient assistance program due to financial reasons.  Will close this go.  Patient Self Care Activities:  .  Patient will provide necessary portions of application    Please see past updates related to this goal by clicking on the "Past Updates" button in the selected goal     . My bladder medication is not working (pt-stated)       Current Barriers:  . Patient states her current bladder control medication (Myrbetriq) is not working.  Pharmacist Clinical  Goal(s):  Marland Kitchen Over the next 90 days, patient will work with PharmD and provider towards optimized medication management  Interventions: Completed visit & telephone call with patient on 06/09/19 . Comprehensive medication review performed; medication list updated in electronic medical record . Patient states her samples of Myrbetriq 50mg  daily have started working, however she is experiencing some urinary leakage.  Patients states Myrbetriq has helped more than any other medication for her incontinence.  She would like to continue on Myrbetriq for now.  She will continue on Myrbetriq 50mg  daily dosing.  Encouraged PT. Samples provided today (06/09/19) at PCP office.  Patient states she cannot afford Mybetriq.  Unfortunately, the manufacturer does not offer patient assistance to patient's who have insurance that will pay for it (despite copay amount).    . She has tried and failed oxybutinin IR and XL in the past. . Patient denies side effects with this medication.  She reports tolerating it well.    Patient Self Care Activities:  . Patient will take medications as prescribed . Patient will inform PCP office of PharmD if additional options for bladder control are needed.  Please see past updates related to this goal by clicking on the "Past Updates" button in the selected goal           Plan:   The care management team will reach out to the patient again over the next 4-6 weeks.  , PharmD, BCPS Clinical Pharmacist, [facility]  [facility]  Direct Dial: [phone]

## 2019-06-16 ENCOUNTER — Telehealth: Payer: Self-pay

## 2019-07-03 ENCOUNTER — Telehealth: Payer: Self-pay

## 2019-07-09 ENCOUNTER — Ambulatory Visit: Payer: Medicare Other | Admitting: Internal Medicine

## 2019-07-10 ENCOUNTER — Telehealth: Payer: Self-pay

## 2019-07-29 ENCOUNTER — Telehealth: Payer: Self-pay

## 2019-08-11 ENCOUNTER — Other Ambulatory Visit: Payer: Self-pay

## 2019-08-11 MED ORDER — AMLODIPINE BESYLATE 5 MG PO TABS: 5.0000 mg | ORAL_TABLET | Freq: Every day | ORAL | 0 refills | Status: DC

## 2019-08-11 MED ORDER — ESCITALOPRAM OXALATE 10 MG PO TABS: 10.0000 mg | ORAL_TABLET | Freq: Every day | ORAL | 0 refills | Status: DC

## 2019-08-12 ENCOUNTER — Other Ambulatory Visit: Payer: Self-pay

## 2019-08-12 MED ORDER — AMLODIPINE BESYLATE 5 MG PO TABS: 5.0000 mg | ORAL_TABLET | Freq: Every day | ORAL | 0 refills | Status: DC

## 2019-08-18 ENCOUNTER — Telehealth: Payer: Self-pay | Admitting: Pharmacist

## 2019-09-03 LAB — HM COLONOSCOPY

## 2019-09-05 HISTORY — PX: CATARACT EXTRACTION, BILATERAL: SHX1313

## 2019-09-10 ENCOUNTER — Other Ambulatory Visit: Payer: Self-pay | Admitting: Internal Medicine

## 2019-09-23 ENCOUNTER — Encounter: Payer: Self-pay | Admitting: Internal Medicine

## 2019-09-26 ENCOUNTER — Telehealth: Payer: Self-pay

## 2019-10-01 ENCOUNTER — Telehealth: Payer: Self-pay | Admitting: Pharmacist

## 2019-10-08 ENCOUNTER — Other Ambulatory Visit: Payer: Self-pay | Admitting: Internal Medicine

## 2019-10-15 ENCOUNTER — Ambulatory Visit (INDEPENDENT_AMBULATORY_CARE_PROVIDER_SITE_OTHER): Payer: Medicare Other

## 2019-10-15 ENCOUNTER — Other Ambulatory Visit: Payer: Self-pay

## 2019-10-15 ENCOUNTER — Telehealth: Payer: Self-pay

## 2019-10-15 DIAGNOSIS — I10 Essential (primary) hypertension: Secondary | ICD-10-CM | POA: Diagnosis not present

## 2019-10-15 DIAGNOSIS — R7303 Prediabetes: Secondary | ICD-10-CM

## 2019-10-15 DIAGNOSIS — J449 Chronic obstructive pulmonary disease, unspecified: Secondary | ICD-10-CM | POA: Diagnosis not present

## 2019-10-16 NOTE — Chronic Care Management (AMB) (Signed)
Chronic Care Management   Initial Visit Note  10/15/2019 Name: Katherine Summers MRN: [id number] DOB: [birth year]  Referred by: Glendale Chard, MD Reason for referral : Chronic Care Management (!1 Attempt New CCM RNCM Telephone Outreach )   Katherine Summers is a 74 y.o. year old female who is a primary care patient of Glendale Chard, MD. The CCM team was consulted for assistance with chronic disease management and care coordination needs related to HTN, COPD and Pre-diabetes, OSA/CPAP use   Review of patient status, including review of consultants reports, relevant laboratory and other test results, and collaboration with appropriate care team members and the patient's provider was performed as part of comprehensive patient evaluation and provision of chronic care management services.    SDOH (Social Determinants of Health) screening performed today: None. See Care Plan for related entries.   Placed outbound initial CCM RN CM call to patient to assess for CCM needs, a care plan was established.   Medications: Outpatient Encounter Medications as of 10/15/2019  Medication Sig  . amLODipine (NORVASC) 5 MG tablet Take 1 tablet (5 mg total) by mouth daily.  Marland Kitchen atorvastatin (LIPITOR) 20 MG tablet TAKE 1 TABLET BY MOUTH EVERY DAY  . escitalopram (LEXAPRO) 10 MG tablet TAKE 1 TABLET(10 MG) BY MOUTH DAILY  . lisinopril-hydrochlorothiazide (ZESTORETIC) 20-12.5 MG tablet Take 1 tablet by mouth daily.  . Melatonin 2.5 MG CAPS One at bedtime po (Patient taking differently: One at bedtime po prn)  . mirabegron ER (MYRBETRIQ) 50 MG TB24 tablet Take 50 mg by mouth daily.  . naproxen (NAPROSYN) 500 MG tablet Take 1 tablet by mouth every 12 (twelve) hours as needed.  . nystatin (NYSTATIN) powder Apply topically 4 (four) times daily.  Marland Kitchen tiotropium (SPIRIVA HANDIHALER) 18 MCG inhalation capsule Spiriva with HandiHaler 18 mcg and inhalation capsules  . VITAMIN D PO Take 5,000 mg by mouth. Take 1 daily mon through  fri   No facility-administered encounter medications on file as of 10/15/2019.      Ref Range & Units 7 mo ago 11 mo ago 2 yr ago  Hgb A1c MFr Bld 4.8 - 5.6 % 6.0High   6.0High  CM  5.8High  CM   Comment:     Prediabetes: 5.7 - 6.4      Diabetes: >6.4      Glycemic control for adults with diabetes: <7.0   Est. average glucose Bld gHb Est-mCnc mg/dL 126  126      Goals Addressed      Patient Stated   . "I might need a new face mask" (pt-stated)       Current Barriers:  Marland Kitchen Knowledge Deficits related to mask options for use of CPAP . Chronic Disease Management support and education needs related to OSA with CPAP, COPD, HTN, Pre-diabetes  Nurse Case Manager Clinical Goal(s):  Marland Kitchen Over the next 90 days, patient will work with CCM RN and PCP to address needs related to disease education and support to help improve efficacy of CPAP use with properly fitting mask . Over the next 90 days, patient will f/u with her prescribing MD to discuss fitting of new mask (nasal pillow)  Interventions:  . Evaluation of current treatment plan related to OSA/CPAP and patient's adherence to plan as established by provider. . Advised patient to contact her prescribing MD for her CPAP to advise she would like to consider switching her CPAP full face mask for a nasal pillow with chin strap . Provided education to  patient re: the importance of having a CPAP mask that fits securely but comfortably for best effectiveness of this treatment and to help improve adherence to following MD recommendations for her CPAP use . Discussed plans with patient for ongoing care management follow up and provided patient with direct contact information for care management team . Provided patient with printed educational materials related to Nasal Pillow Mask  Patient Self Care Activities:  . Patient verbalizes understanding of plan to contact her prescribing MD to discuss switching her full face mask for the nasal  pillow  . Self administers medications as prescribed . Attends all scheduled provider appointments . Calls pharmacy for medication refills . Performs ADL's independently . Performs IADL's independently . Calls provider office for new concerns or questions  Initial goal documentation     . "to keep my A1C low" (pt-stated)       Current Barriers:  Marland Kitchen Knowledge Deficits related to disease process and Self Health Management of Pre-diabetes . Chronic Disease Management support and education needs related to Pre-diabetes , COPD, HTN  Nurse Case Manager Clinical Goal(s):  Marland Kitchen Over the next 90 days, patient will work with CCM RN and PCP to address needs related to disease education and support to help maintain/lower A1C = or<  6.0  Interventions:  . Evaluation of current treatment plan related to Pre-diabetes and patient's adherence to plan as established by provider. . Provided education to patient re: most recent A1C obtained is 6.0; educated on target A1C and "normal" range; educated on how to maintain or lower A1C by adhering to prescribed txt plan; educated on daily glycemic control (80-130); Determined patient does not have a glucometer but would like for PCP to order and instruct her on use . Reviewed medications with patient and discussed patient is not currently prescribed to take a prescription medication for diabetes at this time . Collaborated with PCP and embedded Pharm D regarding patient needs Rx for glucometer; advised patient will contact [employer] [place] D to schedule a face to face office visit for instruction of use . Discussed plans with patient for ongoing care management follow up and provided patient with direct contact information for care management team . Provided patient with printed educational materials related to Managing Diabetes with Meal Planning, Diabetes Zone Safety Tool  . Advised patient, providing education and rationale, to check cbg daily before meals and  record, calling CCM RN and or PCP for findings outside established parameters.    Patient Self Care Activities:  . Patient verbalizes understanding of plan to contact embedded Pharm D Vanice Sarah once she receives her glucometer for instruction on use . Self administers medications as prescribed . Attends all scheduled provider appointments . Calls pharmacy for medication refills . Performs ADL's independently . Performs IADL's independently . Calls provider office for new concerns or questions  Initial goal documentation     . "to keep my BP under control" (pt-stated)       Current Barriers:  Marland Kitchen Knowledge Deficits related to disease process and Self Health management of HTN . Chronic Disease Management support and education needs related to HTN, Pre-diabetes, COPD   Nurse Case Manager Clinical Goal(s):  Marland Kitchen Over the next 90 days, patient will work with the CCM RN and PCP to address needs related to disease education and support for improved Self Health management of HTN  Interventions:  . Evaluation of current treatment plan related to HTN and patient's adherence to plan as established by provider. Marland Kitchen  Provided education to patient re: HTN: basic disease process and how to best manage by adhering to medications exactly as prescribed, implementing exercise into daily/weekly routine and adhering to a heart healthy, low Sodium diet; Education provided related to target BP <130/80 . Reviewed medications with patient and discussed indication, dosage and frequency of prescribed antihypertensive medications; patient reports adherence, she denies having SE and or financial hardship . Discussed plans with patient for ongoing care management follow up and provided patient with direct contact information for care management team . Provided patient with printed educational materials related to What is High Blood Pressure?, African-Americans and High Blood Pressure, Why Should I Restrict Sodium?, Life's  Simple 7  Patient Self Care Activities:  . Self administers medications as prescribed . Attends all scheduled provider appointments . Calls pharmacy for medication refills . Performs ADL's independently . Performs IADL's independently . Calls provider office for new concerns or questions  Initial goal documentation     . COMPLETED: Assist with Chronic Care Management and Care Coordination needs (pt-stated)       Current Barriers:  Marland Kitchen Knowledge Barriers related to resources and support available to address needs related to Chronic Care Management and Community Resources  Case Manager Clinical Goal(s):  Marland Kitchen Over the next 30 days, patient will work with the CCM team to address needs related to Chronic Disease Management and Medication management.   Interventions:  . Collaborated with BSW and initiated plan of care to address needs related to Chronic disease management and Medication management specifically for financial assistance with Spiriva  Patient Self Care Activities:  . Self administers medications as prescribed . Attends all scheduled provider appointments . Calls pharmacy for medication refills . Performs ADL's independently . Performs IADL's independently . Calls provider office for new concerns or questions  Initial goal documentation        Plan:   Telephone follow up appointment with care management team member scheduled for: 11/26/19  Delsa Sale, RN, BSN, CCM Care Management Coordinator [facility] Care Management/[facility]  Direct Phone: [phone]

## 2019-10-16 NOTE — Patient Instructions (Addendum)
Visit Information  Goals Addressed      Patient Stated   . "I might need a new face mask" (pt-stated)       Current Barriers:  Marland Kitchen Knowledge Deficits related to mask options for use of CPAP . Chronic Disease Management support and education needs related to OSA with CPAP, COPD, HTN, Pre-diabetes  Nurse Case Manager Clinical Goal(s):  Marland Kitchen Over the next 90 days, patient will work with CCM RN and PCP to address needs related to disease education and support to help improve efficacy of CPAP use with properly fitting mask . Over the next 90 days, patient will f/u with her prescribing MD to discuss fitting of new mask (nasal pillow)  Interventions:  . Evaluation of current treatment plan related to OSA/CPAP and patient's adherence to plan as established by provider. . Advised patient to contact her prescribing MD for her CPAP to advise she would like to consider switching her CPAP full face mask for a nasal pillow with chin strap . Provided education to patient re: the importance of having a CPAP mask that fits securely but comfortably for best effectiveness of this treatment and to help improve adherence to following MD recommendations for her CPAP use . Discussed plans with patient for ongoing care management follow up and provided patient with direct contact information for care management team . Provided patient with printed educational materials related to Nasal Pillow Mask  Patient Self Care Activities:  . Patient verbalizes understanding of plan to contact her prescribing MD to discuss switching her full face mask for the nasal pillow  . Self administers medications as prescribed . Attends all scheduled provider appointments . Calls pharmacy for medication refills . Performs ADL's independently . Performs IADL's independently . Calls provider office for new concerns or questions  Initial goal documentation     . "to keep my A1C low" (pt-stated)       Current Barriers:  Marland Kitchen Knowledge  Deficits related to disease process and Self Health Management of Pre-diabetes . Chronic Disease Management support and education needs related to Pre-diabetes , COPD, HTN  Nurse Case Manager Clinical Goal(s):  Marland Kitchen Over the next 90 days, patient will work with CCM RN and PCP to address needs related to disease education and support to help maintain/lower A1C = or<  6.0  Interventions:  . Evaluation of current treatment plan related to Pre-diabetes and patient's adherence to plan as established by provider. . Provided education to patient re: most recent A1C obtained is 6.0; educated on target A1C and "normal" range; educated on how to maintain or lower A1C by adhering to prescribed txt plan; educated on daily glycemic control (80-130); Determined patient does not have a glucometer but would like for PCP to order and instruct her on use . Reviewed medications with patient and discussed patient is not currently prescribed to take a prescription medication for diabetes at this time . Collaborated with PCP and embedded Pharm D regarding patient needs Rx for glucometer; advised patient will contact [facility] [place] D to schedule a face to face office visit for instruction of use . Discussed plans with patient for ongoing care management follow up and provided patient with direct contact information for care management team . Provided patient with printed educational materials related to Managing Diabetes with Meal Planning, Diabetes Zone Safety Tool  . Advised patient, providing education and rationale, to check cbg daily before meals and record, calling CCM RN and or PCP for findings outside established parameters.  Patient Self Care Activities:  . Patient verbalizes understanding of plan to contact embedded Pharm D Lottie Dawson once she receives her glucometer for instruction on use . Self administers medications as prescribed . Attends all scheduled provider appointments . Calls pharmacy for  medication refills . Performs ADL's independently . Performs IADL's independently . Calls provider office for new concerns or questions  Initial goal documentation     . "to keep my BP under control" (pt-stated)       Current Barriers:  Marland Kitchen Knowledge Deficits related to disease process and Self Health management of HTN . Chronic Disease Management support and education needs related to HTN, Pre-diabetes, COPD   Nurse Case Manager Clinical Goal(s):  Marland Kitchen Over the next 90 days, patient will work with the [place] and PCP to address needs related to disease education and support for improved Self Health management of HTN  Interventions:  . Evaluation of current treatment plan related to HTN and patient's adherence to plan as established by provider. . Provided education to patient re: HTN: basic disease process and how to best manage by adhering to medications exactly as prescribed, implementing exercise into daily/weekly routine and adhering to a heart healthy, low Sodium diet; Education provided related to target BP <130/80 . Reviewed medications with patient and discussed indication, dosage and frequency of prescribed antihypertensive medications; patient reports adherence, she denies having SE and or financial hardship . Discussed plans with patient for ongoing care management follow up and provided patient with direct contact information for care management team . Provided patient with printed educational materials related to What is High Blood Pressure?, African-Americans and High Blood Pressure, Why Should I Restrict Sodium?, Life's Simple 7  Patient Self Care Activities:  . Self administers medications as prescribed . Attends all scheduled provider appointments . Calls pharmacy for medication refills . Performs ADL's independently . Performs IADL's independently . Calls provider office for new concerns or questions  Initial goal documentation     . COMPLETED: Assist with Chronic Care  Management and Care Coordination needs (pt-stated)       Current Barriers:  Marland Kitchen Knowledge Barriers related to resources and support available to address needs related to Chronic Care Management and Community Resources  Case Manager Clinical Goal(s):  Marland Kitchen Over the next 30 days, patient will work with the CCM team to address needs related to Chronic Disease Management and Medication management.   Interventions:  . Collaborated with BSW and initiated plan of care to address needs related to Chronic disease management and Medication management specifically for financial assistance with Spiriva  Patient Self Care Activities:  . Self administers medications as prescribed . Attends all scheduled provider appointments . Calls pharmacy for medication refills . Performs ADL's independently . Performs IADL's independently . Calls provider office for new concerns or questions  Initial goal documentation        The patient verbalized understanding of instructions provided today and declined a print copy of patient instruction materials.   Telephone follow up appointment with care management team member scheduled for: 11/26/19  Barb Merino, RN, BSN, CCM Care Management Coordinator [facility]  Direct Phone: [phone]

## 2019-10-30 ENCOUNTER — Telehealth: Payer: Self-pay | Admitting: *Deleted

## 2019-10-30 NOTE — Telephone Encounter (Signed)
Pt called for her appt time.

## 2019-10-31 ENCOUNTER — Ambulatory Visit (INDEPENDENT_AMBULATORY_CARE_PROVIDER_SITE_OTHER): Payer: Medicare Other | Admitting: Podiatry

## 2019-10-31 ENCOUNTER — Other Ambulatory Visit: Payer: Self-pay

## 2019-10-31 ENCOUNTER — Encounter: Payer: Self-pay | Admitting: Podiatry

## 2019-10-31 VITALS — BP 130/71 | HR 69 | Temp 96.9°F

## 2019-10-31 DIAGNOSIS — M79674 Pain in right toe(s): Secondary | ICD-10-CM | POA: Diagnosis not present

## 2019-10-31 DIAGNOSIS — B351 Tinea unguium: Secondary | ICD-10-CM | POA: Diagnosis not present

## 2019-10-31 DIAGNOSIS — M2011 Hallux valgus (acquired), right foot: Secondary | ICD-10-CM | POA: Diagnosis not present

## 2019-10-31 DIAGNOSIS — M205X2 Other deformities of toe(s) (acquired), left foot: Secondary | ICD-10-CM | POA: Diagnosis not present

## 2019-10-31 DIAGNOSIS — M79675 Pain in left toe(s): Secondary | ICD-10-CM

## 2019-10-31 NOTE — Patient Instructions (Addendum)
Moisturize feet once daily; do not apply between toes: Vaseline Intensive Care Lotion Lubriderm Lotion Gold Bond Diabetic Foot Lotion Eucerin Intensive Repair Moisturizing Lotion  If you have problems reaching your feet:  Aquaphor Advanced Therapy Ointment Body Spray Vaseline Intensive Care Spray Lotion Advanced Repair     Diabetes Mellitus and Foot Care Foot care is an important part of your health, especially when you have diabetes. Diabetes may cause you to have problems because of poor blood flow (circulation) to your feet and legs, which can cause your skin to:  Become thinner and drier.  Break more easily.  Heal more slowly.  Peel and crack. You may also have nerve damage (neuropathy) in your legs and feet, causing decreased feeling in them. This means that you may not notice minor injuries to your feet that could lead to more serious problems. Noticing and addressing any potential problems early is the best way to prevent future foot problems. How to care for your feet Foot hygiene  Wash your feet daily with warm water and mild soap. Do not use hot water. Then, pat your feet and the areas between your toes until they are completely dry. Do not soak your feet as this can dry your skin.  Trim your toenails straight across. Do not dig under them or around the cuticle. File the edges of your nails with an emery board or nail file.  Apply a moisturizing lotion or petroleum jelly to the skin on your feet and to dry, brittle toenails. Use lotion that does not contain alcohol and is unscented. Do not apply lotion between your toes. Shoes and socks  Wear clean socks or stockings every day. Make sure they are not too tight. Do not wear knee-high stockings since they may decrease blood flow to your legs.  Wear shoes that fit properly and have enough cushioning. Always look in your shoes before you put them on to be sure there are no objects inside.  To break in new shoes, wear them  for just a few hours a day. This prevents injuries on your feet. Wounds, scrapes, corns, and calluses  Check your feet daily for blisters, cuts, bruises, sores, and redness. If you cannot see the bottom of your feet, use a mirror or ask someone for help.  Do not cut corns or calluses or try to remove them with medicine.  If you find a minor scrape, cut, or break in the skin on your feet, keep it and the skin around it clean and dry. You may clean these areas with mild soap and water. Do not clean the area with peroxide, alcohol, or iodine.  If you have a wound, scrape, corn, or callus on your foot, look at it several times a day to make sure it is healing and not infected. Check for: ? Redness, swelling, or pain. ? Fluid or blood. ? Warmth. ? Pus or a bad smell. General instructions  Do not cross your legs. This may decrease blood flow to your feet.  Do not use heating pads or hot water bottles on your feet. They may burn your skin. If you have lost feeling in your feet or legs, you may not know this is happening until it is too late.  Protect your feet from hot and cold by wearing shoes, such as at the beach or on hot pavement.  Schedule a complete foot exam at least once a year (annually) or more often if you have foot problems. If you have   foot problems, report any cuts, sores, or bruises to your health care provider immediately. Contact a health care provider if:  You have a medical condition that increases your risk of infection and you have any cuts, sores, or bruises on your feet.  You have an injury that is not healing.  You have redness on your legs or feet.  You feel burning or tingling in your legs or feet.  You have pain or cramps in your legs and feet.  Your legs or feet are numb.  Your feet always feel cold.  You have pain around a toenail. Get help right away if:  You have a wound, scrape, corn, or callus on your foot and: ? You have pain, swelling, or  redness that gets worse. ? You have fluid or blood coming from the wound, scrape, corn, or callus. ? Your wound, scrape, corn, or callus feels warm to the touch. ? You have pus or a bad smell coming from the wound, scrape, corn, or callus. ? You have a fever. ? You have a red line going up your leg. Summary  Check your feet every day for cuts, sores, red spots, swelling, and blisters.  Moisturize feet and legs daily.  Wear shoes that fit properly and have enough cushioning.  If you have foot problems, report any cuts, sores, or bruises to your health care provider immediately.  Schedule a complete foot exam at least once a year (annually) or more often if you have foot problems. This information is not intended to replace advice given to you by your health care provider. Make sure you discuss any questions you have with your health care provider. Document Revised: 05/14/2019 Document Reviewed: 09/22/2016 Elsevier Patient Education  2020 [employer]  

## 2019-11-01 NOTE — Progress Notes (Signed)
Subjective: Katherine Summers presents today referred by Dorothyann Peng, MD for complaint of painful mycotic nails b/l that are difficult to trim. Pain interferes with ambulation. Aggravating factors include wearing enclosed shoe gear. Pain is relieved with periodic professional debridement.  She states she is pre-diabetic. She has h/o hammertoes and has had surgery several years ago. She feels toes on her right foot are drifting outward.   Past Medical History:  Diagnosis Date  . Allergy   . CKD (chronic kidney disease) stage 2, GFR 60-89 ml/min   . COPD (chronic obstructive pulmonary disease) (HCC)   . Depression 08/01/2016  . Dizziness and giddiness   . Essential hypertension 08/01/2016  . Hyperlipidemia   . Hypertension   . Murmur 08/01/2016  . Pre-diabetes   . Shortness of breath 08/01/2016  . Vitamin D deficiency      Patient Active Problem List   Diagnosis Date Noted  . Urinary frequency 10/31/2018  . Prediabetes 10/31/2018  . OSA on CPAP 07/22/2018  . CPAP use counseling 07/22/2018  . Abnormal dreams 07/22/2018  . Chronic obstructive pulmonary disease (HCC) 03/21/2018  . Other fatigue 03/21/2018  . Snorings 03/21/2018  . Nocturia more than twice per night 03/21/2018  . 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 Overweight (BMI 25.0-29.9) 11/01/2016  . 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 Murmur 08/01/2016  . Shortness of breath 08/01/2016  . Abnormal EKG 08/01/2016  . Essential hypertension 08/01/2016  . Depression 08/01/2016     Past Surgical History:  Procedure Laterality Date  . COLONOSCOPY    . FOOT SURGERY    . TONSILLECTOMY    . 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 TUBAL LIGATION       Current Outpatient Medications on File Prior to Visit  Medication Sig Dispense Refill  . amLODipine (NORVASC) 5 MG  tablet Take 1 tablet (5 mg total) by mouth daily. 90 tablet 0  . atorvastatin (LIPITOR) 20 MG tablet TAKE 1 TABLET BY MOUTH EVERY DAY 90 tablet 0  . escitalopram (LEXAPRO) 10 MG tablet TAKE 1 TABLET(10 MG) BY MOUTH DAILY 90 tablet 0  . lisinopril-hydrochlorothiazide (ZESTORETIC) 20-12.5 MG tablet Take 1 tablet by mouth daily. 90 tablet 2  . Melatonin 2.5 MG CAPS One at bedtime po (Patient taking differently: One at bedtime po prn) 30 capsule 0  . mirabegron ER (MYRBETRIQ) 50 MG TB24 tablet Take 50 mg by mouth daily.    Marland Kitchen nystatin (NYSTATIN) powder Apply topically 4 (four) times daily. 15 g 0  . VITAMIN D PO Take 5,000 mg by mouth. Take 1 daily mon through fri    . naproxen (NAPROSYN) 500 MG tablet Take 1 tablet by mouth every 12 (twelve) hours as needed.    . tiotropium (SPIRIVA HANDIHALER) 18 MCG inhalation capsule Spiriva with HandiHaler 18 mcg and inhalation capsules     No current facility-administered medications on file prior to visit.     No Known Allergies   Social History   Occupational History  . Occupation: retired  Tobacco Use  . Smoking status: Former Smoker    Packs/day: 0.25    Years: 12.00    Pack years: 3.00    Types: Cigarettes    Quit date: 1998    Years since quitting: 23.1  . Smokeless tobacco: Never Used  Substance and Sexual Activity  .  Alcohol use: Not Currently    Alcohol/week: 0.0 standard drinks  . Drug use: No  . Sexual activity: Not Currently     Family History  Problem Relation Age of Onset  . Stroke Mother   . Colon cancer Father   . Colon cancer Sister   . Stroke Maternal Grandmother   . Colon cancer Paternal Grandfather      Immunization History  Administered Date(s) Administered  . PPD Test 12/05/2016     Objective: Vitals:   10/31/19 0942  BP: 130/71  Pulse: 69  Temp: (!) 96.9 F (36.1 C)    Vascular Examination:  Capillary fill time to digits <3s b/l, palpable DP pulses b/l, palpable PT pulses b/l, pedal hair sparse b/l  and skin temperature gradient within normal limits b/l.  Dermatological Examination: Pedal skin with normal turgor, texture and tone bilaterally, no open wounds bilaterally, no interdigital macerations bilaterally and toenails 1-5 b/l elongated, dystrophic, thickened, crumbly with subungual debris.  Musculoskeletal: Normal muscle strength 5/5 to all lower extremity muscle groups bilaterally, bunion deformity noted right, hallux extensus left great toe and digits 2-5 with fibular deviation.  Neurological: Protective sensation intact 5/5 intact bilaterally with 10g monofilament b/l and vibratory sensation intact b/l  Assessment: 1. Pain due to onychomycosis of toenails of both feet   2. Acquired hallux valgus of right foot   3. Acquired hallux extensus of left foot     Plan: -Discussed treatment options for onychomycosis.  Toenails 1-5 b/l were debrided in length and girth with sterile nail nippers and dremel without iatrogenic bleeding. -Dispensed digital toe cap for left great toe.  -Patient to continue soft, supportive shoe gear daily -Patient/POA to call should there be question/concern in the interim.  Return in about 3 months (around 01/28/2020) for diabetic nail trim.

## 2019-11-09 ENCOUNTER — Other Ambulatory Visit: Payer: Self-pay | Admitting: Internal Medicine

## 2019-11-11 ENCOUNTER — Telehealth: Payer: Self-pay | Admitting: Podiatry

## 2019-11-11 NOTE — Telephone Encounter (Signed)
Called pt to let her know that Dr. Eloy End is in clinic and when she is done for the day she will give her a call on her home number. Pt thanked me for letting her know.

## 2019-11-11 NOTE — Telephone Encounter (Signed)
I saw Dr. Eloy End on 02/26. I called and requested for a copy of my medical record for that day because she wanted me to get something for my feet. I never received it, so if you want to talk to me, you can call me at [phone], but I'd appreciate a copy. Thank you.

## 2019-11-11 NOTE — Telephone Encounter (Signed)
Called pt letting her know if she wanted her ov note she would need to fill out and sign a medical records release form. Pt stated she wanted to know what "spray" stuff to get for her fee that Dr. Eloy End mentioned to her as she forgot and didn't get any paperwork at the end or the name. Requested a call back at home number [phone].

## 2019-11-12 ENCOUNTER — Ambulatory Visit (INDEPENDENT_AMBULATORY_CARE_PROVIDER_SITE_OTHER): Payer: Medicare Other

## 2019-11-12 ENCOUNTER — Ambulatory Visit (INDEPENDENT_AMBULATORY_CARE_PROVIDER_SITE_OTHER): Payer: Medicare Other | Admitting: Internal Medicine

## 2019-11-12 ENCOUNTER — Other Ambulatory Visit: Payer: Self-pay

## 2019-11-12 ENCOUNTER — Encounter: Payer: Self-pay | Admitting: Internal Medicine

## 2019-11-12 VITALS — BP 142/80 | HR 91 | Temp 97.6°F | Ht 65.0 in | Wt 186.6 lb

## 2019-11-12 VITALS — BP 142/80 | HR 91 | Temp 97.6°F | Ht 65.0 in | Wt 186.8 lb

## 2019-11-12 DIAGNOSIS — J449 Chronic obstructive pulmonary disease, unspecified: Secondary | ICD-10-CM | POA: Diagnosis not present

## 2019-11-12 DIAGNOSIS — Z0000 Encounter for general adult medical examination without abnormal findings: Secondary | ICD-10-CM

## 2019-11-12 DIAGNOSIS — R7303 Prediabetes: Secondary | ICD-10-CM | POA: Diagnosis not present

## 2019-11-12 DIAGNOSIS — E6609 Other obesity due to excess calories: Secondary | ICD-10-CM

## 2019-11-12 DIAGNOSIS — I10 Essential (primary) hypertension: Secondary | ICD-10-CM

## 2019-11-12 DIAGNOSIS — Z6831 Body mass index (BMI) 31.0-31.9, adult: Secondary | ICD-10-CM

## 2019-11-12 DIAGNOSIS — E559 Vitamin D deficiency, unspecified: Secondary | ICD-10-CM

## 2019-11-12 LAB — POCT URINALYSIS DIPSTICK
Bilirubin, UA: NEGATIVE
Glucose, UA: NEGATIVE
Ketones, UA: NEGATIVE
Leukocytes, UA: NEGATIVE
Nitrite, UA: NEGATIVE
Protein, UA: NEGATIVE
Spec Grav, UA: 1.025 (ref 1.010–1.025)
Urobilinogen, UA: 0.2 E.U./dL
pH, UA: 6 (ref 5.0–8.0)

## 2019-11-12 LAB — POCT UA - MICROALBUMIN
Albumin/Creatinine Ratio, Urine, POC: <30
Creatinine, POC: 300 mg/dL
Microalbumin Ur, POC: 30 mg/L

## 2019-11-12 NOTE — Addendum Note (Signed)
Addended by: Barb Merino on: 11/12/2019 12:36 PM   Modules accepted: Orders

## 2019-11-12 NOTE — Patient Instructions (Signed)
Ms. Katherine Summers , Thank you for taking time to come for your Medicare Wellness Visit. I appreciate your ongoing commitment to your health goals. Please review the following plan we discussed and let me know if I can assist you in the future.   Screening recommendations/referrals: Colonoscopy: 08/2019 Mammogram: 09/2018 Bone Density: 09/2018 Recommended yearly ophthalmology/optometry visit for glaucoma screening and checkup Recommended yearly dental visit for hygiene and checkup  Vaccinations: Influenza vaccine: decline Pneumococcal vaccine: 10/2018 Tdap vaccine: 06/2013 Shingles vaccine: discussed    Advanced directives: Please bring a copy of your POA (Power of [place]) and/or Living Will to your next appointment.    Conditions/risks identified: obesity  Next appointment:    Preventive Care 65 Years and Older, Female Preventive care refers to lifestyle choices and visits with your health care provider that can promote health and wellness. What does preventive care include?  A yearly physical exam. This is also called an annual well check.  Dental exams once or twice a year.  Routine eye exams. Ask your health care provider how often you should have your eyes checked.  Personal lifestyle choices, including:  Daily care of your teeth and gums.  Regular physical activity.  Eating a healthy diet.  Avoiding tobacco and drug use.  Limiting alcohol use.  Practicing safe sex.  Taking low-dose aspirin every day.  Taking vitamin and mineral supplements as recommended by your health care provider. What happens during an annual well check? The services and screenings done by your health care provider during your annual well check will depend on your age, overall health, lifestyle risk factors, and family history of disease. Counseling  Your health care provider may ask you questions about your:  Alcohol use.  Tobacco use.  Drug use.  Emotional well-being.  Home and  relationship well-being.  Sexual activity.  Eating habits.  History of falls.  Memory and ability to understand (cognition).  Work and work Astronomer.  Reproductive health. Screening  You may have the following tests or measurements:  Height, weight, and BMI.  Blood pressure.  Lipid and cholesterol levels. These may be checked every 5 years, or more frequently if you are over 70 years old.  Skin check.  Lung cancer screening. You may have this screening every year starting at age 47 if you have a 30-pack-year history of smoking and currently smoke or have quit within the past 15 years.  Fecal occult blood test (FOBT) of the stool. You may have this test every year starting at age 66.  Flexible sigmoidoscopy or colonoscopy. You may have a sigmoidoscopy every 5 years or a colonoscopy every 10 years starting at age 1.  Hepatitis C blood test.  Hepatitis B blood test.  Sexually transmitted disease (STD) testing.  Diabetes screening. This is done by checking your blood sugar (glucose) after you have not eaten for a while (fasting). You may have this done every 1-3 years.  Bone density scan. This is done to screen for osteoporosis. You may have this done starting at age 2.  Mammogram. This may be done every 1-2 years. Talk to your health care provider about how often you should have regular mammograms. Talk with your health care provider about your test results, treatment options, and if necessary, the need for more tests. Vaccines  Your health care provider may recommend certain vaccines, such as:  Influenza vaccine. This is recommended every year.  Tetanus, diphtheria, and acellular pertussis (Tdap, Td) vaccine. You may need a Td booster every 10  years.  Zoster vaccine. You may need this after age 60.  Pneumococcal 13-valent conjugate (PCV13) vaccine. One dose is recommended after age 57.  Pneumococcal polysaccharide (PPSV23) vaccine. One dose is recommended after  age 52. Talk to your health care provider about which screenings and vaccines you need and how often you need them. This information is not intended to replace advice given to you by your health care provider. Make sure you discuss any questions you have with your health care provider. Document Released: 09/17/2015 Document Revised: 05/10/2016 Document Reviewed: 06/22/2015 Elsevier Interactive Patient Education  2017 [place] Prevention in the Home Falls can cause injuries. They can happen to people of all ages. There are many things you can do to make your home safe and to help prevent falls. What can I do on the outside of my home?  Regularly fix the edges of walkways and driveways and fix any cracks.  Remove anything that might make you trip as you walk through a door, such as a raised step or threshold.  Trim any bushes or trees on the path to your home.  Use bright outdoor lighting.  Clear any walking paths of anything that might make someone trip, such as rocks or tools.  Regularly check to see if handrails are loose or broken. Make sure that both sides of any steps have handrails.  Any raised decks and porches should have guardrails on the edges.  Have any leaves, snow, or ice cleared regularly.  Use sand or salt on walking paths during winter.  Clean up any spills in your garage right away. This includes oil or grease spills. What can I do in the bathroom?  Use night lights.  Install grab bars by the toilet and in the tub and shower. Do not use towel bars as grab bars.  Use non-skid mats or decals in the tub or shower.  If you need to sit down in the shower, use a plastic, non-slip stool.  Keep the floor dry. Clean up any water that spills on the floor as soon as it happens.  Remove soap buildup in the tub or shower regularly.  Attach bath mats securely with double-sided non-slip rug tape.  Do not have throw rugs and other things on the floor that can  make you trip. What can I do in the bedroom?  Use night lights.  Make sure that you have a light by your bed that is easy to reach.  Do not use any sheets or blankets that are too big for your bed. They should not hang down onto the floor.  Have a firm chair that has side arms. You can use this for support while you get dressed.  Do not have throw rugs and other things on the floor that can make you trip. What can I do in the kitchen?  Clean up any spills right away.  Avoid walking on wet floors.  Keep items that you use a lot in easy-to-reach places.  If you need to reach something above you, use a strong step stool that has a grab bar.  Keep electrical cords out of the way.  Do not use floor polish or wax that makes floors slippery. If you must use wax, use non-skid floor wax.  Do not have throw rugs and other things on the floor that can make you trip. What can I do with my stairs?  Do not leave any items on the stairs.  Make sure that  there are handrails on both sides of the stairs and use them. Fix handrails that are broken or loose. Make sure that handrails are as long as the stairways.  Check any carpeting to make sure that it is firmly attached to the stairs. Fix any carpet that is loose or worn.  Avoid having throw rugs at the top or bottom of the stairs. If you do have throw rugs, attach them to the floor with carpet tape.  Make sure that you have a light switch at the top of the stairs and the bottom of the stairs. If you do not have them, ask someone to add them for you. What else can I do to help prevent falls?  Wear shoes that:  Do not have high heels.  Have rubber bottoms.  Are comfortable and fit you well.  Are closed at the toe. Do not wear sandals.  If you use a stepladder:  Make sure that it is fully opened. Do not climb a closed stepladder.  Make sure that both sides of the stepladder are locked into place.  Ask someone to hold it for you,  if possible.  Clearly mark and make sure that you can see:  Any grab bars or handrails.  First and last steps.  Where the edge of each step is.  Use tools that help you move around (mobility aids) if they are needed. These include:  Canes.  Walkers.  Scooters.  Crutches.  Turn on the lights when you go into a dark area. Replace any light bulbs as soon as they burn out.  Set up your furniture so you have a clear path. Avoid moving your furniture around.  If any of your floors are uneven, fix them.  If there are any pets around you, be aware of where they are.  Review your medicines with your doctor. Some medicines can make you feel dizzy. This can increase your chance of falling. Ask your doctor what other things that you can do to help prevent falls. This information is not intended to replace advice given to you by your health care provider. Make sure you discuss any questions you have with your health care provider. Document Released: 06/17/2009 Document Revised: 01/27/2016 Document Reviewed: 09/25/2014 Elsevier Interactive Patient Education  2017 [employer].

## 2019-11-12 NOTE — Patient Instructions (Addendum)
Health Maintenance, Female Adopting a healthy lifestyle and getting preventive care are important in promoting health and wellness. Ask your health care provider about:  The right schedule for you to have regular tests and exams.  Things you can do on your own to prevent diseases and keep yourself healthy. What should I know about diet, weight, and exercise? Eat a healthy diet   Eat a diet that includes plenty of vegetables, fruits, low-fat dairy products, and lean protein.  Do not eat a lot of foods that are high in solid fats, added sugars, or sodium. Maintain a healthy weight Body mass index (BMI) is used to identify weight problems. It estimates body fat based on height and weight. Your health care provider can help determine your BMI and help you achieve or maintain a healthy weight. Get regular exercise Get regular exercise. This is one of the most important things you can do for your health. Most adults should:  Exercise for at least 150 minutes each week. The exercise should increase your heart rate and make you sweat (moderate-intensity exercise).  Do strengthening exercises at least twice a week. This is in addition to the moderate-intensity exercise.  Spend less time sitting. Even light physical activity can be beneficial. Watch cholesterol and blood lipids Have your blood tested for lipids and cholesterol at 73 years of age, then have this test every 5 years. Have your cholesterol levels checked more often if:  Your lipid or cholesterol levels are high.  You are older than 73 years of age.  You are at high risk for heart disease. What should I know about cancer screening? Depending on your health history and family history, you may need to have cancer screening at various ages. This may include screening for:  Breast cancer.  Cervical cancer.  Colorectal cancer.  Skin cancer.  Lung cancer. What should I know about heart disease, diabetes, and high blood  pressure? Blood pressure and heart disease  High blood pressure causes heart disease and increases the risk of stroke. This is more likely to develop in people who have high blood pressure readings, are of African descent, or are overweight.  Have your blood pressure checked: ? Every 3-5 years if you are 68-56 years of age. ? Every year if you are 44 years old or older. Diabetes Have regular diabetes screenings. This checks your fasting blood sugar level. Have the screening done:  Once every three years after age 35 if you are at a normal weight and have a low risk for diabetes.  More often and at a younger age if you are overweight or have a high risk for diabetes. What should I know about preventing infection? Hepatitis B If you have a higher risk for hepatitis B, you should be screened for this virus. Talk with your health care provider to find out if you are at risk for hepatitis B infection. Hepatitis C Testing is recommended for:  Everyone born from [birth year] through 1964.  Anyone with known risk factors for hepatitis C. Sexually transmitted infections (STIs)  Get screened for STIs, including gonorrhea and chlamydia, if: ? You are sexually active and are younger than 73 years of age. ? You are older than 73 years of age and your health care provider tells you that you are at risk for this type of infection. ? Your sexual activity has changed since you were last screened, and you are at increased risk for chlamydia or gonorrhea. Ask your health care provider if  you are at risk.  Ask your health care provider about whether you are at high risk for HIV. Your health care provider may recommend a prescription medicine to help prevent HIV infection. If you choose to take medicine to prevent HIV, you should first get tested for HIV. You should then be tested every 3 months for as long as you are taking the medicine. Pregnancy  If you are about to stop having your period (premenopausal) and  you may become pregnant, seek counseling before you get pregnant.  Take 400 to 800 micrograms (mcg) of folic acid every day if you become pregnant.  Ask for birth control (contraception) if you want to prevent pregnancy. Osteoporosis and menopause Osteoporosis is a disease in which the bones lose minerals and strength with aging. This can result in bone fractures. If you are 39 years old or older, or if you are at risk for osteoporosis and fractures, ask your health care provider if you should:  Be screened for bone loss.  Take a calcium or vitamin D supplement to lower your risk of fractures.  Be given hormone replacement therapy (HRT) to treat symptoms of menopause. Follow these instructions at home: Lifestyle  Do not use any products that contain nicotine or tobacco, such as cigarettes, e-cigarettes, and chewing tobacco. If you need help quitting, ask your health care provider.  Do not use street drugs.  Do not share needles.  Ask your health care provider for help if you need support or information about quitting drugs. Alcohol use  Do not drink alcohol if: ? Your health care provider tells you not to drink. ? You are pregnant, may be pregnant, or are planning to become pregnant.  If you drink alcohol: ? Limit how much you use to 0-1 drink a day. ? Limit intake if you are breastfeeding.  Be aware of how much alcohol is in your drink. In the [place] one drink equals one 12 oz bottle of beer (355 mL), one 5 oz glass of wine (148 mL), or one 1 oz glass of hard liquor (44 mL). General instructions  Schedule regular health, dental, and eye exams.  Stay current with your vaccines.  Tell your health care provider if: ? You often feel depressed. ? You have ever been abused or do not feel safe at home. Summary  Adopting a healthy lifestyle and getting preventive care are important in promoting health and wellness.  Follow your health care provider's instructions about healthy  diet, exercising, and getting tested or screened for diseases.  Follow your health care provider's instructions on monitoring your cholesterol and blood pressure. This information is not intended to replace advice given to you by your health care provider. Make sure you discuss any questions you have with your health care provider. Document Revised: 08/14/2018 Document Reviewed: 08/14/2018 Elsevier Patient Education  [place].   Exercises To Do While Sitting  Exercises that you do while sitting (chair exercises) can give you many of the same benefits as full exercise. Benefits include strengthening your heart, burning calories, and keeping muscles and joints healthy. Exercise can also improve your mood and help with depression and anxiety. You may benefit from chair exercises if you are unable to do standing exercises because of:  Diabetic foot pain.  Obesity.  Illness.  Arthritis.  Recovery from surgery or injury.  Breathing problems.  Balance problems.  Another type of disability. Before starting chair exercises, check with your health care provider or a physical therapist  to find out how much exercise you can tolerate and which exercises are safe for you. If your health care provider approves:  Start out slowly and build up over time. Aim to work up to about 10-20 minutes for each exercise session.  Make exercise part of your daily routine.  Drink water when you exercise. Do not wait until you are thirsty. Drink every 10-15 minutes.  Stop exercising right away if you have pain, nausea, shortness of breath, or dizziness.  If you are exercising in a wheelchair, make sure to lock the wheels.  Ask your health care provider whether you can do tai chi or yoga. Many positions in these mind-body exercises can be modified to do while seated. Warm-up Before starting other exercises: 1. Sit up as straight as you can. Have your knees bent at 90 degrees, which is the shape  of the capital letter "L." Keep your feet flat on the floor. 2. Sit at the front edge of your chair, if you can. 3. Pull in (tighten) the muscles in your abdomen and stretch your spine and neck as straight as you can. Hold this position for a few minutes. 4. Breathe in and out evenly. Try to concentrate on your breathing, and relax your mind. Stretching Exercise A: Arm stretch 1. Hold your arms out straight in front of your body. 2. Bend your hands at the wrist with your fingers pointing up, as if signaling someone to stop. Notice the slight tension in your forearms as you hold the position. 3. Keeping your arms out and your hands bent, rotate your hands outward as far as you can and hold this stretch. Aim to have your thumbs pointing up and your pinkie fingers pointing down. Slowly repeat arm stretches for one minute as tolerated. Exercise B: Leg stretch 1. If you can move your legs, try to "draw" letters on the floor with the toes of your foot. Write your name with one foot. 2. Write your name with the toes of your other foot. Slowly repeat the movements for one minute as tolerated. Exercise C: Reach for the sky 1. Reach your hands as far over your head as you can to stretch your spine. 2. Move your hands and arms as if you are climbing a rope. Slowly repeat the movements for one minute as tolerated. Range of motion exercises Exercise A: Shoulder roll 1. Let your arms hang loosely at your sides. 2. Lift just your shoulders up toward your ears, then let them relax back down. 3. When your shoulders feel loose, rotate your shoulders in backward and forward circles. Do shoulder rolls slowly for one minute as tolerated. Exercise B: March in place 1. As if you are marching, pump your arms and lift your legs up and down. Lift your knees as high as you can. ? If you are unable to lift your knees, just pump your arms and move your ankles and feet up and down. March in place for one minute as  tolerated. Exercise C: Seated jumping jacks 1. Let your arms hang down straight. 2. Keeping your arms straight, lift them up over your head. Aim to point your fingers to the ceiling. 3. While you lift your arms, straighten your legs and slide your heels along the floor to your sides, as wide as you can. 4. As you bring your arms back down to your sides, slide your legs back together. ? If you are unable to use your legs, just move your arms. Slowly  repeat seated jumping jacks for one minute as tolerated. Strengthening exercises Exercise A: Shoulder squeeze 1. Hold your arms straight out from your body to your sides, with your elbows bent and your fists pointed at the ceiling. 2. Keeping your arms in the bent position, move them forward so your elbows and forearms meet in front of your face. 3. Open your arms back out as wide as you can with your elbows still bent, until you feel your shoulder blades squeezing together. Hold for 5 seconds. Slowly repeat the movements forward and backward for one minute as tolerated. Contact a health care provider if you:  Had to stop exercising due to any of the following: ? Pain. ? Nausea. ? Shortness of breath. ? Dizziness. ? Fatigue.  Have significant pain or soreness after exercising. Get help right away if you have:  Chest pain.  Difficulty breathing. These symptoms may represent a serious problem that is an emergency. Do not wait to see if the symptoms will go away. Get medical help right away. Call your local emergency services (911 in the [place] Do not drive yourself to the hospital. This information is not intended to replace advice given to you by your health care provider. Make sure you discuss any questions you have with your health care provider. Document Revised: 12/12/2018 Document Reviewed: 07/04/2017 Elsevier Patient Education  2020 [employer].

## 2019-11-12 NOTE — Progress Notes (Signed)
This visit occurred during the SARS-CoV-2 public health emergency.  Safety protocols were in place, including screening questions prior to the visit, additional usage of staff PPE, and extensive cleaning of exam room while observing appropriate contact time as indicated for disinfecting solutions.  Subjective:   TRUDEE CHIRINO is a 74 y.o. female who presents for Medicare Annual (Subsequent) preventive examination.  Review of Systems:  n/a Cardiac Risk Factors include: advanced age (>31men, >59 women);hypertension;obesity (BMI >30kg/m2);sedentary lifestyle     Objective:     Vitals: BP (!) 142/80 (BP Location: Right Arm, Patient Position: Sitting, Cuff Size: Normal)   Pulse 91   Temp 97.6 F (36.4 C) (Oral)   Ht 5\' 5"  (1.651 m)   Wt 186 lb 12.8 oz (84.7 kg)   SpO2 96%   BMI 31.09 kg/m   Body mass index is 31.09 kg/m.  Advanced Directives 11/12/2019 03/18/2019 10/31/2018 02/24/2018 10/31/2016 10/31/2016 10/25/2016  Does Patient Have a Medical Advance Directive? Yes Yes Yes No Yes Yes Yes  Type of 10/27/2016 of [place];Living will Healthcare Power of [place];Living will Living will;Healthcare Power of [place] - Healthcare Power of [place];Living will - Healthcare Power of [place];Living will  Does patient want to make changes to medical advance directive? - No - Patient declined No - Patient declined - No - Patient declined - No - Patient declined  Copy of Healthcare Power of Attorney in Chart? No - copy requested No - copy requested No - copy requested - No - copy requested - -  Would patient like information on creating a medical advance directive? - - - No - Patient declined - - -    Tobacco Social History   Tobacco Use  Smoking Status Former Smoker  . Packs/day: 0.25  . Years: 12.00  . Pack years: 3.00  . Types: Cigarettes  . Quit date: 69  . Years since quitting: 23.2  Smokeless Tobacco Never Used     Counseling given: Not  Answered   Clinical Intake:  Pre-visit preparation completed: Yes  Pain : No/denies pain     Nutritional Status: BMI > 30  Obese Nutritional Risks: None Diabetes: No  How often do you need to have someone help you when you read instructions, pamphlets, or other written materials from your doctor or pharmacy?: 1 - Never What is the last grade level you completed in school?: 2 years college  Interpreter Needed?: No  Information entered by :: NAllen LPN  Past Medical History:  Diagnosis Date  . Allergy   . CKD (chronic kidney disease) stage 2, GFR 60-89 ml/min   . COPD (chronic obstructive pulmonary disease) (HCC)   . Depression 08/01/2016  . Dizziness and giddiness   . Essential hypertension 08/01/2016  . Hyperlipidemia   . Hypertension   . Murmur 08/01/2016  . Pre-diabetes   . Shortness of breath 08/01/2016  . Vitamin D deficiency    Past Surgical History:  Procedure Laterality Date  . CATARACT EXTRACTION, BILATERAL  2021  . COLONOSCOPY    . EYE SURGERY    . FOOT SURGERY    . TONSILLECTOMY    . 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 TUBAL LIGATION     Family History  Problem Relation Age of Onset  . Stroke Mother   . Colon cancer Father   .  Colon cancer Sister   . Stroke Maternal Grandmother   . Colon cancer Paternal Grandfather    Social History   Socioeconomic History  . Marital status: Single    Spouse name: Not on file  . Number of children: Not on file  . Years of education: Not on file  . Highest education level: Not on file  Occupational History  . Occupation: retired  Tobacco Use  . Smoking status: Former Smoker    Packs/day: 0.25    Years: 12.00    Pack years: 3.00    Types: Cigarettes    Quit date: 1998    Years since quitting: 23.2  . Smokeless tobacco: Never Used   Substance and Sexual Activity  . Alcohol use: Not Currently    Alcohol/week: 0.0 standard drinks  . Drug use: No  . Sexual activity: Not Currently  Other Topics Concern  . Not on file  Social History Narrative  . Not on file   Social Determinants of Health   Financial Resource Strain: Low Risk   . Difficulty of Paying Living Expenses: Not hard at all  Food Insecurity: No Food Insecurity  . Worried About Programme researcher, broadcasting/film/video in the Last Year: Never true  . Ran Out of Food in the Last Year: Never true  Transportation Needs: No Transportation Needs  . Lack of Transportation (Medical): No  . Lack of Transportation (Non-Medical): No  Physical Activity: Inactive  . Days of Exercise per Week: 0 days  . Minutes of Exercise per Session: 0 min  Stress: No Stress Concern Present  . Feeling of Stress : Not at all  Social Connections:   . Frequency of Communication with Friends and Family: Not on file  . Frequency of Social Gatherings with Friends and Family: Not on file  . Attends Religious Services: Not on file  . Active Member of Clubs or Organizations: Not on file  . Attends Banker Meetings: Not on file  . Marital Status: Not on file    Outpatient Encounter Medications as of 11/12/2019  Medication Sig  . amLODipine (NORVASC) 5 MG tablet TAKE 1 TABLET(5 MG) BY MOUTH DAILY  . atorvastatin (LIPITOR) 20 MG tablet TAKE 1 TABLET BY MOUTH EVERY DAY  . escitalopram (LEXAPRO) 10 MG tablet TAKE 1 TABLET(10 MG) BY MOUTH DAILY  . lisinopril-hydrochlorothiazide (ZESTORETIC) 20-12.5 MG tablet Take 1 tablet by mouth daily.  . Melatonin 2.5 MG CAPS One at bedtime po (Patient taking differently: One at bedtime po prn)  . tiotropium (SPIRIVA HANDIHALER) 18 MCG inhalation capsule Spiriva with HandiHaler 18 mcg and inhalation capsules  . VITAMIN D PO Take 5,000 mg by mouth. Take 1 daily mon through fri  . mirabegron ER (MYRBETRIQ) 50 MG TB24 tablet Take 50 mg by mouth daily.  . naproxen  (NAPROSYN) 500 MG tablet Take 1 tablet by mouth every 12 (twelve) hours as needed.  . nystatin (NYSTATIN) powder Apply topically 4 (four) times daily. (Patient not taking: Reported on 11/12/2019)  . [DISCONTINUED] amLODipine (NORVASC) 5 MG tablet Take 1 tablet (5 mg total) by mouth daily.   No facility-administered encounter medications on file as of 11/12/2019.    Activities of Daily Living In your present state of health, do you have any difficulty performing the following activities: 11/12/2019  Hearing? N  Vision? N  Difficulty concentrating or making decisions? N  Walking or climbing stairs? Y  Dressing or bathing? N  Doing errands, shopping? N  Preparing Food and eating ?  N  Using the Toilet? N  In the past six months, have you accidently leaked urine? Y  Comment wears a pad  Do you have problems with loss of bowel control? N  Managing your Medications? N  Managing your Finances? N  Housekeeping or managing your Housekeeping? N  Some recent data might be hidden    Patient Care Team: Glendale Chard, MD as PCP - General (Internal Medicine) Daneen Schick as Social Worker [place], Royce Macadamia, [facility] (Pharmacist) Rex Kras Claudette Stapler, RN as Case Manager    Assessment:   This is a routine wellness examination for [place].  Exercise Activities and Dietary recommendations Current Exercise Habits: The patient does not participate in regular exercise at present  Goals    . "I fall a lot" (pt-stated)     Current Barriers:  . "stiffness" reported after lack of exercise since knee replacement "two years ago" . Lives alone . Inconsistent use of cane  Clinical Social Work Clinical Goal(s):  Marland Kitchen Over the next 60 days, patient will work with embedded CCM team to address needs related to fall intervention  CCM SW Interventions: Completed 03/18/2019 . Patient interviewed and appropriate assessments performed - the patient reports she lives alone and falls often.  . Determined the patient last  fell "two days ago" . Assessed for assistive devices used in the home - the patient reports she has a cane which she uses while out of the home. The patient indicates she has just started using a cane inside of her home after her children have encouraged her to . The patient denies ownership of a life alert and does report at times she must call someone to help her get off the floor . Advised patient to keep her phone on her at all times in the event she falls and needs help . Educated the patient on options to request orders for home health PT to assist with strength and balancing - the patient declines this option today due to rising concerns with COVID 19 . Advised the patient embedded RN Case Manager [place] would follow up with her at a later date to complete a telephonic fall risk assessment  . Collaborated with RN Case Manager re: patient enrollment and stated goal  Patient Self Care Activities:  . Self administers medications as prescribed . Attends all scheduled provider appointments . Calls provider office for new concerns or questions  Initial goal documentation     . "I might need a new face mask" (pt-stated)     Current Barriers:  Marland Kitchen Knowledge Deficits related to mask options for use of CPAP . Chronic Disease Management support and education needs related to OSA with CPAP, COPD, HTN, Pre-diabetes  Nurse Case Manager Clinical Goal(s):  Marland Kitchen Over the next 90 days, patient will work with CCM RN and PCP to address needs related to disease education and support to help improve efficacy of CPAP use with properly fitting mask . Over the next 90 days, patient will f/u with her prescribing MD to discuss fitting of new mask (nasal pillow)  Interventions:  . Evaluation of current treatment plan related to OSA/CPAP and patient's adherence to plan as established by provider. . Advised patient to contact her prescribing MD for her CPAP to advise she would like to consider switching her  CPAP full face mask for a nasal pillow with chin strap . Provided education to patient re: the importance of having a CPAP mask that fits securely but comfortably for best effectiveness  of this treatment and to help improve adherence to following MD recommendations for her CPAP use . Discussed plans with patient for ongoing care management follow up and provided patient with direct contact information for care management team . Provided patient with printed educational materials related to Nasal Pillow Mask  Patient Self Care Activities:  . Patient verbalizes understanding of plan to contact her prescribing MD to discuss switching her full face mask for the nasal pillow  . Self administers medications as prescribed . Attends all scheduled provider appointments . Calls pharmacy for medication refills . Performs ADL's independently . Performs IADL's independently . Calls provider office for new concerns or questions  Initial goal documentation     . "to keep my A1C low" (pt-stated)     Current Barriers:  Marland Kitchen Knowledge Deficits related to disease process and Self Health Management of Pre-diabetes . Chronic Disease Management support and education needs related to Pre-diabetes , COPD, HTN  Nurse Case Manager Clinical Goal(s):  Marland Kitchen Over the next 90 days, patient will work with CCM RN and PCP to address needs related to disease education and support to help maintain/lower A1C = or<  6.0  Interventions:  . Evaluation of current treatment plan related to Pre-diabetes and patient's adherence to plan as established by provider. . Provided education to patient re: most recent A1C obtained is 6.0; educated on target A1C and "normal" range; educated on how to maintain or lower A1C by adhering to prescribed txt plan; educated on daily glycemic control (80-130); Determined patient does not have a glucometer but would like for PCP to order and instruct her on use . Reviewed medications with patient and  discussed patient is not currently prescribed to take a prescription medication for diabetes at this time . Collaborated with PCP and embedded Pharm D regarding patient needs Rx for glucometer; advised patient will contact [place] D to schedule a face to face office visit for instruction of use . Discussed plans with patient for ongoing care management follow up and provided patient with direct contact information for care management team . Provided patient with printed educational materials related to Managing Diabetes with Meal Planning, Diabetes Zone Safety Tool  . Advised patient, providing education and rationale, to check cbg daily before meals and record, calling CCM RN and or PCP for findings outside established parameters.    Patient Self Care Activities:  . Patient verbalizes understanding of plan to contact embedded Pharm D Vanice Sarah once she receives her glucometer for instruction on use . Self administers medications as prescribed . Attends all scheduled provider appointments . Calls pharmacy for medication refills . Performs ADL's independently . Performs IADL's independently . Calls provider office for new concerns or questions  Initial goal documentation     . "to keep my BP under control" (pt-stated)     Current Barriers:  Marland Kitchen Knowledge Deficits related to disease process and Self Health management of HTN . Chronic Disease Management support and education needs related to HTN, Pre-diabetes, COPD   Nurse Case Manager Clinical Goal(s):  Marland Kitchen Over the next 90 days, patient will work with the CCM RN and PCP to address needs related to disease education and support for improved Self Health management of HTN  Interventions:  . Evaluation of current treatment plan related to HTN and patient's adherence to plan as established by provider. . Provided education to patient re: HTN: basic disease process and how to best manage by adhering to medications exactly as  prescribed, implementing exercise into daily/weekly routine and adhering to a heart healthy, low Sodium diet; Education provided related to target BP <130/80 . Reviewed medications with patient and discussed indication, dosage and frequency of prescribed antihypertensive medications; patient reports adherence, she denies having SE and or financial hardship . Discussed plans with patient for ongoing care management follow up and provided patient with direct contact information for care management team . Provided patient with printed educational materials related to What is High Blood Pressure?, African-Americans and High Blood Pressure, Why Should I Restrict Sodium?, Life's Simple 7  Patient Self Care Activities:  . Self administers medications as prescribed . Attends all scheduled provider appointments . Calls pharmacy for medication refills . Performs ADL's independently . Performs IADL's independently . Calls provider office for new concerns or questions  Initial goal documentation     . Exercise 150 min/wk Moderate Activity (pt-stated)    . My bladder medication is not working (pt-stated)     Current Barriers:  . Patient states her current bladder control medication (Myrbetriq) is not working.  Pharmacist Clinical Goal(s):  Marland Kitchen. Over the next 90 days, patient will work with PharmD and provider towards optimized medication management  Interventions: Completed visit & telephone call with patient on 06/09/19 . Comprehensive medication review performed; medication list updated in electronic medical record . Patient states her samples of Myrbetriq 50mg  daily have started working, however she is experiencing some urinary leakage.  Patients states Myrbetriq has helped more than any other medication for her incontinence.  She would like to continue on Myrbetriq for now.  She will continue on Myrbetriq 50mg  daily dosing.  Encouraged PT. Samples provided today (06/09/19) at PCP office.  Patient states  she cannot afford Mybetriq.  Unfortunately, the manufacturer does not offer patient assistance to patient's who have insurance that will pay for it (despite copay amount).    . She has tried and failed oxybutinin IR and XL in the past. . Patient denies side effects with this medication.  She reports tolerating it well.    Patient Self Care Activities:  . Patient will take medications as prescribed . Patient will inform PCP office of PharmD if additional options for bladder control are needed.  Please see past updates related to this goal by clicking on the "Past Updates" button in the selected goal      . Patient Stated     11/12/2019, wants to eat healthier       Fall Risk Fall Risk  11/12/2019 03/04/2019 10/31/2018 03/21/2018  Falls in the past year? 1 1 1  Yes  Comment tripped - - -  Number falls in past yr: 1 0 1 2 or more  Comment - - loss balance -  Injury with Fall? 0 0 0 No  Risk Factor Category  - - - High Fall Risk  Risk for fall due to : Impaired balance/gait;Medication side effect - History of fall(s);Medication side effect -  Follow up Falls evaluation completed;Education provided;Falls prevention discussed - Education provided;Falls prevention discussed -   Is the patient's home free of loose throw rugs in walkways, pet beds, electrical cords, etc?   yes      Grab bars in the bathroom? no      Handrails on the stairs?   n/a      Adequate lighting?   yes  Timed Get Up and Go performed: n/a  Depression Screen PHQ 2/9 Scores 11/12/2019 03/18/2019 03/04/2019 10/31/2018  PHQ - 2 Score 0 0 0  0  PHQ- 9 Score 3 - - 0     Cognitive Function     6CIT Screen 11/12/2019 10/31/2018  What Year? 0 points 0 points  What month? 0 points 0 points  What time? 0 points 0 points  Count back from 20 0 points 0 points  Months in reverse 0 points 0 points  Repeat phrase 2 points 0 points  Total Score 2 0    Immunization History  Administered Date(s) Administered  . PPD Test  12/05/2016  . Pneumococcal Conjugate-13 10/31/2018    Qualifies for Shingles Vaccine? yes  Screening Tests Health Maintenance  Topic Date Due  . INFLUENZA VACCINE  04/05/2019  . OPHTHALMOLOGY EXAM  07/19/2019  . HEMOGLOBIN A1C  09/03/2019  . MAMMOGRAM  10/04/2020  . FOOT EXAM  10/30/2020  . TETANUS/TDAP  07/01/2023  . COLONOSCOPY  09/02/2029  . DEXA SCAN  Completed  . Hepatitis C Screening  Completed  . PNA vac Low Risk Adult  Completed    Cancer Screenings: Lung: Low Dose CT Chest recommended if Age 90-80 years, 30 pack-year currently smoking OR have quit w/in 15years. Patient does not qualify. Breast:  Up to date on Mammogram? Yes   Up to date of Bone Density/Dexa? Yes Colorectal: up to date  Additional Screenings: : Hepatitis C Screening: 01/02/2014     Plan:    Patient wants to eat healthier.   I have personally reviewed and noted the following in the patient's chart:   . Medical and social history . Use of alcohol, tobacco or illicit drugs  . Current medications and supplements . Functional ability and status . Nutritional status . Physical activity . Advanced directives . List of other physicians . Hospitalizations, surgeries, and ER visits in previous 12 months . Vitals . Screenings to include cognitive, depression, and falls . Referrals and appointments  In addition, I have reviewed and discussed with patient certain preventive protocols, quality metrics, and best practice recommendations. A written personalized care plan for preventive services as well as general preventive health recommendations were provided to patient.     Barb Merino, LPN  [id number]

## 2019-11-13 LAB — CMP14+EGFR
ALT: 15 IU/L (ref 0–32)
AST: 19 IU/L (ref 0–40)
Albumin/Globulin Ratio: 1.5 (ref 1.2–2.2)
Albumin: 4.4 g/dL (ref 3.7–4.7)
Alkaline Phosphatase: 125 IU/L — ABNORMAL HIGH (ref 39–117)
BUN/Creatinine Ratio: 15 (ref 12–28)
BUN: 13 mg/dL (ref 8–27)
Bilirubin Total: 0.4 mg/dL (ref 0.0–1.2)
CO2: 27 mmol/L (ref 20–29)
Calcium: 10.1 mg/dL (ref 8.7–10.3)
Chloride: 102 mmol/L (ref 96–106)
Creatinine, Ser: 0.87 mg/dL (ref 0.57–1.00)
GFR calc Af Amer: 77 mL/min/{1.73_m2} (ref >59)
GFR calc non Af Amer: 67 mL/min/{1.73_m2} (ref >59)
Globulin, Total: 2.9 g/dL (ref 1.5–4.5)
Glucose: 80 mg/dL (ref 65–99)
Potassium: 3.8 mmol/L (ref 3.5–5.2)
Sodium: 145 mmol/L — ABNORMAL HIGH (ref 134–144)
Total Protein: 7.3 g/dL (ref 6.0–8.5)

## 2019-11-13 LAB — CBC
Hematocrit: 43.4 % (ref 34.0–46.6)
Hemoglobin: 14.5 g/dL (ref 11.1–15.9)
MCH: 29.6 pg (ref 26.6–33.0)
MCHC: 33.4 g/dL (ref 31.5–35.7)
MCV: 89 fL (ref 79–97)
Platelets: 257 10*3/uL (ref 150–450)
RBC: 4.9 x10E6/uL (ref 3.77–5.28)
RDW: 13.1 % (ref 11.7–15.4)
WBC: 10.7 10*3/uL (ref 3.4–10.8)

## 2019-11-13 LAB — LIPID PANEL
Chol/HDL Ratio: 2.3 ratio (ref 0.0–4.4)
Cholesterol, Total: 138 mg/dL (ref 100–199)
HDL: 61 mg/dL (ref >39)
LDL Chol Calc (NIH): 60 mg/dL (ref 0–99)
Triglycerides: 88 mg/dL (ref 0–149)
VLDL Cholesterol Cal: 17 mg/dL (ref 5–40)

## 2019-11-13 LAB — HEMOGLOBIN A1C
Est. average glucose Bld gHb Est-mCnc: 126 mg/dL
Hgb A1c MFr Bld: 6 % — ABNORMAL HIGH (ref 4.8–5.6)

## 2019-11-13 LAB — VITAMIN D 25 HYDROXY (VIT D DEFICIENCY, FRACTURES): Vit D, 25-Hydroxy: 84.4 ng/mL (ref 30.0–100.0)

## 2019-11-17 NOTE — Progress Notes (Signed)
This visit occurred during the SARS-CoV-2 public health emergency.  Safety protocols were in place, including screening questions prior to the visit, additional usage of staff PPE, and extensive cleaning of exam room while observing appropriate contact time as indicated for disinfecting solutions.  Subjective:     Patient ID: Katherine Summers , female    DOB: 1945-12-23 , 74 y.o.   MRN: [id number]   Chief Complaint  Patient presents with  . Annual Exam  . Hypertension    HPI  She is here today for a full physical examination. She is no longer followed by GYN. She denies having any chest pain, palpitations and shortness of breath.   Hypertension This is a chronic problem. The current episode started more than 1 year ago. The problem has been gradually improving since onset. The problem is controlled. Pertinent negatives include no blurred vision, chest pain, palpitations or shortness of breath. Risk factors for coronary artery disease include obesity, post-menopausal state, stress and sedentary lifestyle. The current treatment provides moderate improvement. Compliance problems include exercise.      Past Medical History:  Diagnosis Date  . Allergy   . CKD (chronic kidney disease) stage 2, GFR 60-89 ml/min   . COPD (chronic obstructive pulmonary disease) ([place])   . Depression 08/01/2016  . Dizziness and giddiness   . Essential hypertension 08/01/2016  . Hyperlipidemia   . Hypertension   . Murmur 08/01/2016  . Pre-diabetes   . Shortness of breath 08/01/2016  . Vitamin D deficiency      Family History  Problem Relation Age of Onset  . Stroke Mother   . Colon cancer Father   . Colon cancer Sister   . Stroke Maternal Grandmother   . Colon cancer Paternal Grandfather      Current Outpatient Medications:  .  amLODipine (NORVASC) 5 MG tablet, TAKE 1 TABLET(5 MG) BY MOUTH DAILY, Disp: 90 tablet, Rfl: 0 .  atorvastatin (LIPITOR) 20 MG tablet, TAKE 1 TABLET BY MOUTH EVERY DAY, Disp:  90 tablet, Rfl: 0 .  escitalopram (LEXAPRO) 10 MG tablet, TAKE 1 TABLET(10 MG) BY MOUTH DAILY, Disp: 90 tablet, Rfl: 0 .  lisinopril-hydrochlorothiazide (ZESTORETIC) 20-12.5 MG tablet, Take 1 tablet by mouth daily., Disp: 90 tablet, Rfl: 2 .  Melatonin 2.5 MG CAPS, One at bedtime po (Patient taking differently: One at bedtime po prn), Disp: 30 capsule, Rfl: 0 .  mirabegron ER (MYRBETRIQ) 50 MG TB24 tablet, Take 50 mg by mouth daily., Disp: , Rfl:  .  naproxen (NAPROSYN) 500 MG tablet, Take 1 tablet by mouth every 12 (twelve) hours as needed., Disp: , Rfl:  .  nystatin (NYSTATIN) powder, Apply topically 4 (four) times daily. (Patient not taking: Reported on 11/12/2019), Disp: 15 g, Rfl: 0 .  tiotropium (SPIRIVA HANDIHALER) 18 MCG inhalation capsule, Spiriva with HandiHaler 18 mcg and inhalation capsules, Disp: , Rfl:  .  VITAMIN D PO, Take 5,000 mg by mouth. Take 1 daily mon through fri, Disp: , Rfl:    No Known Allergies    The patient states she uses post menopausal status for birth control. Last LMP was No LMP recorded. Patient is postmenopausal.. Negative for Menorrhagia Negative for: breast discharge, breast lump(s), breast pain and breast self exam. Associated symptoms include abnormal vaginal bleeding. Pertinent negatives include abnormal bleeding (hematology), anxiety, decreased libido, depression, difficulty falling sleep, dyspareunia, history of infertility, nocturia, sexual dysfunction, sleep disturbances, urinary incontinence, urinary urgency, vaginal discharge and vaginal itching. Diet regular.The patient states her exercise level  is    . The patient's tobacco use is:  Social History   Tobacco Use  Smoking Status Former Smoker  . Packs/day: 0.25  . Years: 12.00  . Pack years: 3.00  . Types: Cigarettes  . Quit date: 55  . Years since quitting: 23.2  Smokeless Tobacco Never Used  . She has been exposed to passive smoke. The patient's alcohol use is:  Social History   Substance  and Sexual Activity  Alcohol Use Not Currently  . Alcohol/week: 0.0 standard drinks    Review of Systems  Constitutional: Negative.   Eyes: Negative for blurred vision.  Respiratory: Negative.  Negative for shortness of breath.   Cardiovascular: Negative.  Negative for chest pain and palpitations.  Gastrointestinal: Negative.   Neurological: Negative.   Psychiatric/Behavioral: Negative.      Today's Vitals   11/12/19 0938  BP: (!) 142/80  Pulse: 91  Temp: 97.6 F (36.4 C)  TempSrc: Oral  Weight: 186 lb 9.6 oz (84.6 kg)  Height: '5\' 5"'$  (1.651 m)  PainSc: 0-No pain   Body mass index is 31.05 kg/m.   Objective:  Physical Exam Vitals and nursing note reviewed.  Constitutional:      Appearance: Normal appearance. She is obese.  HENT:     Head: Normocephalic and atraumatic.     Right Ear: Tympanic membrane, ear canal and external ear normal.     Left Ear: Tympanic membrane, ear canal and external ear normal.     Nose:     Comments: Deferred, masked    Mouth/Throat:     Comments: Deferred, masked Eyes:     Extraocular Movements: Extraocular movements intact.     Conjunctiva/sclera: Conjunctivae normal.     Pupils: Pupils are equal, round, and reactive to light.  Cardiovascular:     Rate and Rhythm: Normal rate and regular rhythm.     Pulses: Normal pulses.     Heart sounds: Normal heart sounds.  Pulmonary:     Effort: Pulmonary effort is normal.     Breath sounds: Normal breath sounds.  Abdominal:     General: Bowel sounds are normal.     Palpations: Abdomen is soft.     Comments: Rounded, soft  Genitourinary:    Comments: deferred Musculoskeletal:        General: Normal range of motion.     Cervical back: Normal range of motion and neck supple.  Skin:    General: Skin is warm and dry.  Neurological:     General: No focal deficit present.     Mental Status: She is alert and oriented to person, place, and time.  Psychiatric:        Mood and Affect: Mood  normal.        Behavior: Behavior normal.         Assessment And Plan:     1. Routine general medical examination at health care facility  A full exam was performed.  Importance of monthly self breast exams was discussed with the patient.  PATIENT IS ADVISED TO GET 30-45 MINUTES REGULAR EXERCISE NO LESS THAN FOUR TO FIVE DAYS PER WEEK - BOTH WEIGHTBEARING EXERCISES AND AEROBIC ARE RECOMMENDED.  HE/SHE IS ADVISED TO FOLLOW A HEALTHY DIET WITH AT LEAST SIX FRUITS/VEGGIES PER DAY, DECREASE INTAKE OF RED MEAT, AND TO INCREASE FISH INTAKE TO TWO DAYS PER WEEK.  MEATS/FISH SHOULD NOT BE FRIED, BAKED OR BROILED IS PREFERABLE.  I SUGGEST WEARING SPF 50 SUNSCREEN ON EXPOSED PARTS AND ESPECIALLY  WHEN IN THE DIRECT SUNLIGHT FOR AN EXTENDED PERIOD OF TIME.  PLEASE AVOID FAST FOOD RESTAURANTS AND INCREASE YOUR WATER INTAKE.  2. Essential hypertension  Chronic, fair control. She will continue with current meds. She is encouraged to avoid adding salt to her foods. EKG performed, NSR w/ ST depression, neg T waves. I will check labs as listed below.   - EKG 12-Lead - CMP14+EGFR - CBC - Lipid panel  3. Prediabetes  HER A1C HAS BEEN ELEVATED IN THE PAST. I WILL CHECK AN A1C, BMET TODAY. SHE WAS ENCOURAGED TO AVOID SUGARY BEVERAGES AND PROCESSED FOODS INCLUDNG BREADS, RICE AND PASTA.  - Hemoglobin A1c  4. Chronic obstructive pulmonary disease, unspecified COPD type ([facility])  Chronic, yet stable. She is encouraged to take Spiriva as directed.   5. Vitamin D deficiency disease  I WILL CHECK A VIT D LEVEL AND SUPPLEMENT AS NEEDED.  ALSO ENCOURAGED TO SPEND 15 MINUTES IN THE SUN DAILY.  - Vitamin D (25 hydroxy)  6. Class 1 obesity due to excess calories with serious comorbidity and body mass index (BMI) of 31.0 to 31.9 in adult  She is encouraged to strive for BMI less than 29 to decrease cardiac risk. Again, she is advised to increase daily activity. Advised to perform sit to stand exercises while  watching TV.  Maximino Greenland, MD    THE PATIENT IS ENCOURAGED TO PRACTICE SOCIAL DISTANCING DUE TO THE COVID-19 PANDEMIC.

## 2019-11-19 ENCOUNTER — Telehealth: Payer: Self-pay

## 2019-11-26 ENCOUNTER — Telehealth: Payer: Self-pay

## 2019-12-08 LAB — HM MAMMOGRAPHY: HM Mammogram: NORMAL (ref 0–4)

## 2019-12-18 DIAGNOSIS — M6281 Muscle weakness (generalized): Secondary | ICD-10-CM | POA: Insufficient documentation

## 2019-12-18 DIAGNOSIS — R2689 Other abnormalities of gait and mobility: Secondary | ICD-10-CM | POA: Insufficient documentation

## 2019-12-30 ENCOUNTER — Telehealth: Payer: Self-pay

## 2019-12-30 NOTE — Telephone Encounter (Signed)
Pt should be scheduled with the NP. I called pt to discuss this with her. No answer, left a message asking her to call me back. If pt calls back please advise her of this and schedule with the NP.

## 2020-01-01 ENCOUNTER — Other Ambulatory Visit: Payer: Self-pay

## 2020-01-01 ENCOUNTER — Encounter: Payer: Self-pay | Admitting: Neurology

## 2020-01-01 ENCOUNTER — Ambulatory Visit (INDEPENDENT_AMBULATORY_CARE_PROVIDER_SITE_OTHER): Payer: Medicare Other | Admitting: Neurology

## 2020-01-01 VITALS — BP 142/74 | HR 91 | Temp 97.0°F | Ht 65.5 in | Wt 192.0 lb

## 2020-01-01 DIAGNOSIS — Z9989 Dependence on other enabling machines and devices: Secondary | ICD-10-CM

## 2020-01-01 DIAGNOSIS — G4733 Obstructive sleep apnea (adult) (pediatric): Secondary | ICD-10-CM

## 2020-01-01 DIAGNOSIS — R4189 Other symptoms and signs involving cognitive functions and awareness: Secondary | ICD-10-CM | POA: Diagnosis not present

## 2020-01-01 DIAGNOSIS — G252 Other specified forms of tremor: Secondary | ICD-10-CM

## 2020-01-01 NOTE — Progress Notes (Addendum)
SLEEP MEDICINE CLINIC   Provider:  Larey Seat, MD   Primary Care Physician:  Glendale Chard, MD   Referring Provider: Glendale Chard, MD   Chief Complaint  Patient presents with  . Follow-up    pt alone, rm 10. here for her pap follow up. had a question about trying a different mask    HPI:  01-01-2020: Katherine Summers is a 74 y.o. african- [place] female patient and follows here after her 2019 sleep study, she was not seen during the pandemic. She received both covid shots.  I originally met with the patient when she was referred for dream enactment behavior but then was diagnosed with obstructive sleep apnea.  She started using CPAP after her titration study from August 2019.   Over the last year he has not followed up but she has remained a compliant user of CPAP her current compliance by days is 93% and by over 4 hours is 70%.  On average she uses the machine and she would like to switch to a nasal pillow mask.  Hours and 50 minutes at night.  She uses an air sense 10 ResMed machine with a minimum setter of 4 maximum setting of 11 cmH2O and 3 cm EPR.  She reports that she prefers to sleep on her sides and would like a nasal mask.  The patient has used her machine 28 out of 30 days but cannot always use the machine comfortably for 4 hours or longer average use at time is 4 hours 54 minutes and hourly compliance was 67%.  She uses an air sense 10 AutoSet by ResMed with a minimum pressure of 4 maximum pressure of 11 and EPR level of 3 cmH2O her 95th percentile pressure was 9.7 so it straddles the top numbers.  Her air leak is 38.7% liters per minute.  Her residual AHI was measured at 8.2 but only 3.8 actually measured as obstructive apneas  She asked for a F and P Brevida- nasal mask.   In addition she described SOB with PT and exercise, hip weakness, and knee pain. Memory impairment,    Last seen when following up on 07-22-2018 after 2 sleep studies.  The patient underwent a  baseline polysomnography on 07 April 2018, the procedure revealed moderate to severe sleep apnea with an AHI of 27.5, there was no REM sleep noted, supine AHI was just a tad bit higher at 30.6/h.  The patient had borderline CO2 retention, no significant oxygen desaturation.  Her EKG was a normal sinus rhythm and she was asked to return for a CPAP titration.  The vast majority of her respiratory events were hypopneas, 166 of them, indicating that she was a shallow breather. She returned for an attended CPAP titration on 20 August, had a significant sleep latency of 85 minutes still had no REM sleep and sleep efficiency was only 61.3%.  Her AHI was significantly reduced to 3.1/h.  CPAP was obviously not easy for her to tolerate.  At the last pressure under which she slept very well was a 10 cm setting, I had asked for a nasal pillow interface dream wear "to be fitted, -during the sleep study she was given a full facemask to which she had objected. She has not brought the machine nor her interface with her.  She has not been fitted with a nasal swift with ear loops , but stated she is interested as her hair gets entangled.  Compliance download : Katherine Summers has used the  air sense 10 AutoSet CPAP at home with a minimum pressure of 4 and a maximum pressure of 11 cmH2O, average use of time is 9 hours 51 minutes each day, she is 100% compliant by days and time.  Her residual AHI is only 1.9 AHI per hour.  She does have some significant air leaks the 95th percentile is 31.3 L/min, pressure is 95th percentile 8 requalified as unknown which is usually a sign of an oral airleak.  Cm. She reportedly gets a "smiley face" on the CPAP display in the morning.     Consultation; patient  seen here in a referral from Dr. Baird Cancer for evaluation of nocturia. I have the pleasure of meeting with Katherine Summers, a 74 year old female patient of Dr. Baird Cancer that looks very familiar to me but we could not find out  where we have  met. Mrs. Decuir reports that she had bilateral knee replacements but that her gait has been limited her ambulation is limited by pain, she is using a cane and altogether she feels that the knee replacements have not changed her symptoms to the better in comparison to how she felt before.  She used to sleep well through the night, and never had sleep problems per se.  By a winter 2018 however she began having very frequent bathroom breaks at night which fragmented her sleep.  She was placed on oxybutynin but this has not helped the frequency.  Her primary care physician is therefore concerned that it may be related to obstructive sleep apnea.  I would like to add that the patient also is on hydrochlorothiazide a very low dose 12.5 mg to be taken in the morning each day.  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  She continues with her meds and is compliant, she has not had any recent additional changes in her medications she is using Spiriva, oxybutynin, lisinopril-hydrochlorothiazide, escitalopram-Lexapro 10 mg atorvastatin 20 mg and amlodipine 5 mg tablets.  While she does report that her sleep is no longer as restorative and refreshing as it used to be she feels rather fatigued and excessively sleepy.  Her Epworth sleepiness score was endorsed at seven-point her fatigue severity score at 58 points, the geriatric depression score at 5 out of 15 points.  Overall coughing, wheezing, snoring and shortness of breath as well as increased thirst have affected her sleepiness probably as much as incontinence and nocturia.     Sleep habits are as follows: The patient reports that her bedtime is usually around 11:30 PM after she works the late night meals on TV.  She does not have difficulties falling asleep she just cannot stay asleep follow-up.  Is an hour she has her first bathroom break and from there on hourly.  Sometimes every 2, so  she may have up to 5 bathroom breaks at night. The patient's bedroom is described as cool, quiet and dark.  Conducive to sleep.  She sleeps alone.  She sleeps supine or on either side.  She cannot sleep prone she supports her knees with a pillow at night.  Before she had knee pain she did not used to sleep supine.  She sleeps with 2 pillows for head support, her bed is not adjusted. She reports that when she gets up very quickly she is lightheaded and dizzy. Sometimes she has vertigo, and she has learned to take a couple of seconds at a deep breath before she  initiates movement.She denies waking up with palpitations, diaphoresis, headaches or chest pain. She cannot remember the last time she slept well.   Sleep medical history and family sleep history: son has sleep apnea. She has COPD.   Social history:  Widowed, 2 children- adult sons. Non smoker- quit in 1990. 7 pack years  COPD. Rare ETOH 2 a year.  Caffeine use - 2 cups of coffee a week, pepsi 5 a week, mostly water. Iced tea- when eating out.    Review of Systems: Out of a complete 14 system review, the patient complains of only the following symptoms, and all other reviewed systems are negative.Nocturia reduced from every 90 minutes to every 3 hours.  Insomnia improved , joint pain, coughing, gasping. Mind keeps racing. She sleeps longer,   Epworth score 8/24 points since being on CPAP (?)    , Fatigue severity score 54/ 63   , depression score is high 11/ 15 points.  How likely are you to doze in the following situations: 0 = not likely, 1 = slight chance, 2 = moderate chance, 3 = high chance  Sitting and Reading? Watching Television? Sitting inactive in a public place (theater or meeting)? Lying down in the afternoon when circumstances permit? Sitting and talking to someone? Sitting quietly after lunch without alcohol? In a car, while stopped for a few minutes in traffic? As a passenger in a car for an hour without a  break?  Total = 8   memory loss. Tremor     Social History   Socioeconomic History  . Marital status: Single    Spouse name: Not on file  . Number of children: Not on file  . Years of education: Not on file  . Highest education level: Not on file  Occupational History  . Occupation: retired  Tobacco Use  . Smoking status: Former Smoker    Packs/day: 0.25    Years: 12.00    Pack years: 3.00    Types: Cigarettes    Quit date: 1998    Years since quitting: 23.3  . Smokeless tobacco: Never Used  Substance and Sexual Activity  . Alcohol use: Not Currently    Alcohol/week: 0.0 standard drinks  . Drug use: No  . Sexual activity: Not Currently  Other Topics Concern  . Not on file  Social History Narrative  . Not on file   Social Determinants of Health   Financial Resource Strain: Low Risk   . Difficulty of Paying Living Expenses: Not hard at all  Food Insecurity: No Food Insecurity  . Worried About Charity fundraiser in the Last Year: Never true  . Ran Out of Food in the Last Year: Never true  Transportation Needs: No Transportation Needs  . Lack of Transportation (Medical): No  . Lack of Transportation (Non-Medical): No  Physical Activity: Inactive  . Days of Exercise per Week: 0 days  . Minutes of Exercise per Session: 0 min  Stress: No Stress Concern Present  . Feeling of Stress : Not at all  Social Connections:   . Frequency of Communication with Friends and Family:   . Frequency of Social Gatherings with Friends and Family:   . Attends Religious Services:   . Active Member of Clubs or Organizations:   . Attends Archivist Meetings:   Marland Kitchen Marital Status:   Intimate Partner Violence:   . Fear of Current or Ex-Partner:   . Emotionally Abused:   Marland Kitchen Physically Abused:   .  Sexually Abused:     Family History  Problem Relation Age of Onset  . Stroke Mother   . Colon cancer Father   . Colon cancer Sister   . Stroke Maternal Grandmother   . Colon  cancer Paternal Grandfather     Past Medical History:  Diagnosis Date  . Allergy   . CKD (chronic kidney disease) stage 2, GFR 60-89 ml/min   . COPD (chronic obstructive pulmonary disease) ([place])   . Depression 08/01/2016  . Dizziness and giddiness   . Essential hypertension 08/01/2016  . Hyperlipidemia   . Hypertension   . Murmur 08/01/2016  . Pre-diabetes   . Shortness of breath 08/01/2016  . Vitamin D deficiency     Past Surgical History:  Procedure Laterality Date  . CATARACT EXTRACTION, BILATERAL  2021  . COLONOSCOPY    . EYE SURGERY    . FOOT SURGERY    . TONSILLECTOMY    . 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 TUBAL LIGATION      Current Outpatient Medications  Medication Sig Dispense Refill  . amLODipine (NORVASC) 5 MG tablet TAKE 1 TABLET(5 MG) BY MOUTH DAILY 90 tablet 0  . atorvastatin (LIPITOR) 20 MG tablet TAKE 1 TABLET BY MOUTH EVERY DAY 90 tablet 0  . escitalopram (LEXAPRO) 10 MG tablet TAKE 1 TABLET(10 MG) BY MOUTH DAILY 90 tablet 0  . lisinopril-hydrochlorothiazide (ZESTORETIC) 20-12.5 MG tablet Take 1 tablet by mouth daily. 90 tablet 2  . Melatonin 2.5 MG CAPS One at bedtime po (Patient taking differently: One at bedtime po prn) 30 capsule 0  . naproxen (NAPROSYN) 500 MG tablet Take 1 tablet by mouth every 12 (twelve) hours as needed.    . nystatin (NYSTATIN) powder Apply topically 4 (four) times daily. 15 g 0  . VITAMIN D PO Take 5,000 mg by mouth. Take 1 daily mon through fri    . oxybutynin (DITROPAN-XL) 10 MG 24 hr tablet oxybutynin chloride ER 10 mg tablet,extended release 24 hr     No current facility-administered medications for this visit.    Allergies as of 01/01/2020  . (No Known Allergies)  Order: [id number] Status:  Final result Visible to patient:  No (inaccessible in MyChart)  Next appt:  01/19/2020 at 11:15 AM in Internal Medicine ([place]) Dx:  Essential hypertension  Ref Range & Units 1 mo ago  (11/12/19) 10 mo ago  (03/04/19) 1 yr ago  (10/31/18) 1 yr ago  (02/24/18)  Color, UA  yellow  yellow  yellow    Clarity, UA  cloudy  clear  clear    Glucose, UA Negative Negative  Negative  Negative    Bilirubin, UA  negative  negative  negative    Ketones, UA  negative  negative  negative    Spec Grav, UA 1.010 - 1.025 1.025  1.020  1.015    Blood, UA  trace intact  trace  trace-intact    pH, UA 5.0 - 8.0 6.0  7.0  7.0    Protein, UA Negative Negative  Negative  Negative    Urobilinogen, UA 0.2 or 1.0 E.U./dL 0.2  0.2  0.2    Nitrite, UA  negative  negative  negative    Leukocytes, UA Negative Negative  Negative  Negative  NEGATIVE R, CM   Appearance  cloudy  clear  HAZYAbnormal  R   Odor  none    none    Resulting Agency     [facility]            Other Results from 11/12/2019  POCT UA - Microalbumin  Status:  Final result Visible to patient:  No (inaccessible in MyChart) Next appt:  01/19/2020 at 11:15 AM in Internal Medicine ([place]) Dx:  Essential hypertension Order: [id number]  Ref Range & Units 1 mo ago  Microalbumin Ur, POC mg/L 30   Creatinine, POC mg/dL 300   Albumin/Creatinine Ratio, Urine, POC  <30             MyChart Results Release  MyChart Status: Pending Results Release  All Reviewers List  Glendale Chard, MD on 11/12/2019 18:44  Encounter  View Encounter       Result Information  Flag: AbnormalAbnormal   Status: Final result (Collected: 11/12/2019 12:34) Provider Status: Reviewed              Vitals: BP (!) 142/74   Pulse 91   Temp (!) 97 F (36.1 C)   Ht 5' 5.5" (1.664 m)   Wt 192 lb (87.1 kg)   BMI 31.46 kg/m  Last Weight:  Wt Readings from Last 1 Encounters:  01/01/20 192 lb (87.1 kg)   NLZ:JQBH mass index is 31.46 kg/m.     Last Height:   Ht Readings from Last 1 Encounters:   01/01/20 5' 5.5" (1.664 m)    Physical exam:  General: The patient is awake, alert and appears not in acute distress. The patient is well groomed. Head: Normocephalic, atraumatic. Neck is supple. Mallampati 3,  neck circumference:15. 5" . Nasal airflow a rarely patent - she is a mouth breather  ,. Retrognathia is mild . Cardiovascular:  Regular rate and rhythm , with a mitral murmur, no carotid bruit, and without distended neck veins. Respiratory: Lungs clear to auscultation. Skin:  Without evidence of edema, or rash BMI is 31 .   Neurologic exam : The patient is awake and alert, oriented to place and time. Mood and affect are anxious.  No flowsheet data found. 25/ 30 - missing trail and 3 out of 5 words, getting only 2 steps of serial 7th  Cranial nerves: Pupils are equal and briskly reactive to light. Visual fields by finger perimetry are intact. Hearing to finger rub intact.  Facial sensation intact to fine touch. Facial motor strength is symmetric and tongue and uvula move midline. Shoulder shrug was symmetrical.  Motor exam: ROM of both knees is limited. Upper extremities have full ROM.  Diffusely elevated tone, muscle bulk and symmetric strength in upper extremities, but weak grip- she has a stronger pinch than grip strength. Equal shoulder shrug.  Thenar atrophy.  Sensory:  Fine touch, pinprick and vibration were normal. Coordination: Finger-to-nose maneuver  without evidence of ataxia, dysmetria but she has a mild resting tremor, most dominant over the right.  This tremor is still present a year later  Gait and station: Patient walks with a cane and has a slowed gait, slightly shuffling, stooped. She got SOB -  Needs assistive device . Deep tendon reflexes: in the upper and lower extremities are symmetric and intact.  Assessment:  After physical and neurologic examination, review of laboratory studies, results of polysomnography and / or neurophysiology testing and pre-existing  records as far as provided in visit., my assessment is:    1) OSA confirmed by PSG  and treated with CPAP. Needs to use distilled water, change water every day. 100% compliant yet not less sleepy or fatigued - may be due to residual depression.  Air leaks are high- even on bela swift- [place] by Caryn Section and [employer]- I like for her to be fitted.  I will reset the pressures to 5-15 , 2 cm water EPR, chinstrap.    2) She reports she still dreams vividly, " the weirdest dreams "  and frequently acting out her dreams, yelling out. Kicking, boxing. Increased dreams since using CPAP and having apnea control. Remebers her dreams now.  There are physical findings , such as diffuse tone elevation, mild resting tremor on the dominant side, shuffling and history of 2 falls before CPAP. Need to keep in mind that she could develop REM BD ,Lewy body dementia / Parkinsonisms. She reports memory impairment .   3)  Nocturia- 4-5 times , further improved to 1 at night !  I spent more than 25 minutes of face to face time with the patient. Greater than 50% of time was spent in counseling and coordination of care. We have discussed the diagnosis and differential and I answered the patient's questions.    Plan:  Treatment plan and additional workup :    RV with NP in 6 month. MOCA !!!! Needs screening for tremor, shuffling gait and steadiness.       Larey Seat, MD 05/15/6815, 6:19 PM  Certified in Neurology by ABPN Certified in [place] by [facility] [address], [place], [place] [address]

## 2020-01-01 NOTE — Patient Instructions (Signed)

## 2020-01-08 ENCOUNTER — Telehealth: Payer: Self-pay

## 2020-01-08 NOTE — Telephone Encounter (Signed)
I called the pt to give her the lab results of her most recent labs and to let her know that her handicap placard application has been placed in the mail to the patient.

## 2020-01-19 ENCOUNTER — Telehealth: Payer: Self-pay

## 2020-01-19 ENCOUNTER — Ambulatory Visit: Payer: Self-pay

## 2020-01-19 ENCOUNTER — Other Ambulatory Visit: Payer: Self-pay

## 2020-01-19 DIAGNOSIS — R7303 Prediabetes: Secondary | ICD-10-CM

## 2020-01-19 DIAGNOSIS — J449 Chronic obstructive pulmonary disease, unspecified: Secondary | ICD-10-CM

## 2020-01-19 DIAGNOSIS — I10 Essential (primary) hypertension: Secondary | ICD-10-CM

## 2020-01-20 NOTE — Chronic Care Management (AMB) (Signed)
  Chronic Care Management   Outreach Note  01/19/2020 Name: Katherine Summers MRN: [id number] DOB: June 03, 1946  Referred by: Dorothyann Peng, MD Reason for referral : Chronic Care Management ([facility] RN CM Call )   An unsuccessful telephone outreach was attempted today. The patient was referred to the case management team for assistance with care management and care coordination.   Follow Up Plan: A HIPPA compliant phone message was left for the patient providing contact information and requesting a return call.  Telephone follow up appointment with care management team member scheduled for: 02/19/20  Delsa Sale, RN, BSN, CCM Care Management Coordinator [facility]  Direct Phone: [phone]

## 2020-01-30 ENCOUNTER — Ambulatory Visit (INDEPENDENT_AMBULATORY_CARE_PROVIDER_SITE_OTHER): Payer: Medicare Other | Admitting: Podiatry

## 2020-01-30 ENCOUNTER — Encounter: Payer: Self-pay | Admitting: Podiatry

## 2020-01-30 ENCOUNTER — Other Ambulatory Visit: Payer: Self-pay

## 2020-01-30 DIAGNOSIS — B351 Tinea unguium: Secondary | ICD-10-CM

## 2020-01-30 DIAGNOSIS — M79674 Pain in right toe(s): Secondary | ICD-10-CM | POA: Diagnosis not present

## 2020-01-30 DIAGNOSIS — M79675 Pain in left toe(s): Secondary | ICD-10-CM | POA: Diagnosis not present

## 2020-01-30 NOTE — Progress Notes (Signed)
Subjective: Katherine Summers is a 74 y.o. female patient seen today painful mycotic nails b/l that are difficult to trim. Pain interferes with ambulation. Aggravating factors include wearing enclosed shoe gear. Pain is relieved with periodic professional debridement. Patient states her toenails are very long and painful and 3 months is too long between appointments.  Patient Active Problem List   Diagnosis Date Noted  . Urinary frequency 10/31/2018  . Prediabetes 10/31/2018  . OSA on CPAP 07/22/2018  . CPAP use counseling 07/22/2018  . Abnormal dreams 07/22/2018  . Chronic obstructive pulmonary disease (HCC) 03/21/2018  . Other fatigue 03/21/2018  . Snorings 03/21/2018  . Nocturia more than twice per night 03/21/2018  . 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 Overweight (BMI 25.0-29.9) 11/01/2016  . 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 Murmur 08/01/2016  . Shortness of breath 08/01/2016  . Abnormal EKG 08/01/2016  . Essential hypertension 08/01/2016  . Depression 08/01/2016    Current Outpatient Medications on File Prior to Visit  Medication Sig Dispense Refill  . amLODipine (NORVASC) 5 MG tablet TAKE 1 TABLET(5 MG) BY MOUTH DAILY 90 tablet 0  . atorvastatin (LIPITOR) 20 MG tablet TAKE 1 TABLET BY MOUTH EVERY DAY 90 tablet 0  . escitalopram (LEXAPRO) 10 MG tablet TAKE 1 TABLET(10 MG) BY MOUTH DAILY 90 tablet 0  . lisinopril-hydrochlorothiazide (ZESTORETIC) 20-12.5 MG tablet Take 1 tablet by mouth daily. 90 tablet 2  . Melatonin 2.5 MG CAPS One at bedtime po (Patient taking differently: One at bedtime po prn) 30 capsule 0  . naproxen (NAPROSYN) 500 MG tablet Take 1 tablet by mouth every 12 (twelve) hours as needed.    . nystatin (NYSTATIN) powder Apply topically 4 (four) times daily. 15 g 0  . oxybutynin (DITROPAN-XL) 10 MG 24 hr tablet oxybutynin chloride ER 10 mg tablet,extended release 24 hr    . VITAMIN D PO Take 5,000 mg by mouth. Take 1 daily mon through fri     No current  facility-administered medications on file prior to visit.    No Known Allergies  Objective: Physical Exam  General: Katherine Summers is a pleasant 74 y.o.  African American female, in NAD. AAO x 3.   Vascular:  Neurovascular status unchanged b/l. Capillary fill time to digits <3 seconds b/l. Palpable DP pulses b/l. Palpable PT pulses b/l. Pedal hair sparse b/l. Skin temperature gradient within normal limits b/l.  Dermatological:  Pedal skin is thin shiny, atrophic bilaterally. No open wounds bilaterally. No interdigital macerations bilaterally. Toenails 1-5 b/l elongated, dystrophic, thickened, crumbly with subungual debris and tenderness to dorsal palpation.  Musculoskeletal:  Normal muscle strength 5/5 to all lower extremity muscle groups bilaterally. No pain crepitus or joint limitation noted with ROM b/l. Hallux extensus left>right. HAV with bunion b/l. Digits 2-5 right with fibular deviation. Utilizes cane for ambulation assistance.   Neurological:  Protective sensation intact 5/5 intact bilaterally with 10g monofilament b/l. Vibratory sensation intact b/l.  Assessment and Plan:  1. Pain due to onychomycosis of toenails of both feet    -Examined patient. -No new findings. No new orders. -Toenails 1-5 b/l were debrided in length and girth with sterile nail nippers and dremel without iatrogenic bleeding.  -Patient to continue soft, supportive shoe gear daily. -Patient to report any pedal injuries to medical professional immediately. -Patient/POA to call should there be question/concern in the interim.  Return in about 10 weeks (around 04/09/2020) for nail trim.  Freddie Breech, DPM

## 2020-01-30 NOTE — Patient Instructions (Signed)

## 2020-01-31 ENCOUNTER — Other Ambulatory Visit: Payer: Self-pay | Admitting: Internal Medicine

## 2020-02-19 ENCOUNTER — Telehealth: Payer: Self-pay

## 2020-03-12 ENCOUNTER — Other Ambulatory Visit: Payer: Self-pay | Admitting: Internal Medicine

## 2020-03-26 ENCOUNTER — Telehealth: Payer: Self-pay

## 2020-04-13 ENCOUNTER — Ambulatory Visit (INDEPENDENT_AMBULATORY_CARE_PROVIDER_SITE_OTHER): Payer: Medicare Other | Admitting: Podiatry

## 2020-04-13 ENCOUNTER — Other Ambulatory Visit: Payer: Self-pay | Admitting: Internal Medicine

## 2020-04-13 ENCOUNTER — Encounter: Payer: Self-pay | Admitting: Podiatry

## 2020-04-13 ENCOUNTER — Other Ambulatory Visit: Payer: Self-pay

## 2020-04-13 DIAGNOSIS — M79674 Pain in right toe(s): Secondary | ICD-10-CM | POA: Diagnosis not present

## 2020-04-13 DIAGNOSIS — M79675 Pain in left toe(s): Secondary | ICD-10-CM | POA: Diagnosis not present

## 2020-04-13 DIAGNOSIS — M205X2 Other deformities of toe(s) (acquired), left foot: Secondary | ICD-10-CM

## 2020-04-13 DIAGNOSIS — B351 Tinea unguium: Secondary | ICD-10-CM

## 2020-04-13 DIAGNOSIS — M2011 Hallux valgus (acquired), right foot: Secondary | ICD-10-CM

## 2020-04-13 NOTE — Progress Notes (Signed)
Subjective: Katherine Summers is a 74 y.o. female patient seen today painful mycotic nails b/l that are difficult to trim. Pain interferes with ambulation. Aggravating factors include wearing enclosed shoe gear. Pain is relieved with periodic professional debridement.  Patient states she has been participating in the [employer] program about 3 days per week and notices her feet are sore. States she wears sneakers she slides in but cannot remember the name of them.  Patient Active Problem List   Diagnosis Date Noted  . Urinary frequency 10/31/2018  . Prediabetes 10/31/2018  . OSA on CPAP 07/22/2018  . CPAP use counseling 07/22/2018  . Abnormal dreams 07/22/2018  . Chronic obstructive pulmonary disease (HCC) 03/21/2018  . Other fatigue 03/21/2018  . Snorings 03/21/2018  . Nocturia more than twice per night 03/21/2018  . 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 Overweight (BMI 25.0-29.9) 11/01/2016  . 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 Murmur 08/01/2016  . Shortness of breath 08/01/2016  . Abnormal EKG 08/01/2016  . Essential hypertension 08/01/2016  . Depression 08/01/2016    Current Outpatient Medications on File Prior to Visit  Medication Sig Dispense Refill  . atorvastatin (LIPITOR) 20 MG tablet TAKE 1 TABLET BY MOUTH EVERY DAY 90 tablet 0  . escitalopram (LEXAPRO) 10 MG tablet TAKE 1 TABLET(10 MG) BY MOUTH DAILY 90 tablet 1  . lisinopril-hydrochlorothiazide (ZESTORETIC) 20-12.5 MG tablet TAKE 1 TABLET BY MOUTH DAILY 90 tablet 2  . Melatonin 2.5 MG CAPS One at bedtime po (Patient taking differently: One at bedtime po prn) 30 capsule 0  . naproxen (NAPROSYN) 500 MG tablet Take 1 tablet by mouth every 12 (twelve) hours as needed.    . nystatin (NYSTATIN) powder Apply topically 4 (four) times daily. 15 g 0  . oxybutynin (DITROPAN-XL) 10 MG 24 hr tablet oxybutynin chloride ER 10 mg tablet,extended release 24 hr    . VITAMIN D PO Take 5,000 mg by mouth. Take 1 daily mon through fri     No  current facility-administered medications on file prior to visit.    No Known Allergies  Objective: Physical Exam  General: CYNITHIA HAKIMI is a pleasant 74 y.o.  African American female, in NAD. AAO x 3.   Vascular:  Neurovascular status unchanged b/l. Capillary fill time to digits <3 seconds b/l. Palpable DP pulses b/l. Palpable PT pulses b/l. Pedal hair sparse b/l. Skin temperature gradient within normal limits b/l.  Dermatological:  Pedal skin is thin shiny, atrophic bilaterally. No open wounds bilaterally. No interdigital macerations bilaterally. Toenails 1-5 b/l elongated, dystrophic, thickened, crumbly with subungual debris and tenderness to dorsal palpation.  Musculoskeletal:  Normal muscle strength 5/5 to all lower extremity muscle groups bilaterally. Muscle strength 5/5 to all LE muscle groups of b/l lower extremities. No pain crepitus or joint limitation noted with ROM b/l. Pes planus deformity noted b/l.  Utilizes cane for ambulation assistance.   Neurological:  Protective sensation intact 5/5 intact bilaterally with 10g monofilament b/l. Vibratory sensation intact b/l.  Assessment and Plan:  1. Pain due to onychomycosis of toenails of both feet   2. Acquired hallux valgus of right foot   3. Acquired hallux extensus of left foot    -Examined patient. -Recommended change in shoe gear to [employer]. Dispensed coupons for [employer] and [employer]. Also recommended Lock Laces if she has a hard time tying her shoes. -Toenails 1-5 b/l were debrided in length and girth with sterile nail nippers and dremel without iatrogenic bleeding.  -  Patient to continue soft, supportive shoe gear daily. -Patient to report any pedal injuries to medical professional immediately. -Patient/POA to call should there be question/concern in the interim.  Return in about 10 weeks (around 06/22/2020) for nail trim.  Freddie Breech, DPM

## 2020-04-30 ENCOUNTER — Other Ambulatory Visit: Payer: Self-pay | Admitting: Internal Medicine

## 2020-05-14 ENCOUNTER — Other Ambulatory Visit: Payer: Self-pay | Admitting: Internal Medicine

## 2020-05-20 ENCOUNTER — Encounter: Payer: Self-pay | Admitting: Internal Medicine

## 2020-05-20 ENCOUNTER — Ambulatory Visit (INDEPENDENT_AMBULATORY_CARE_PROVIDER_SITE_OTHER): Payer: Medicare Other | Admitting: Internal Medicine

## 2020-05-20 ENCOUNTER — Other Ambulatory Visit: Payer: Self-pay

## 2020-05-20 VITALS — BP 118/70 | HR 77 | Temp 98.6°F | Ht 65.5 in | Wt 190.2 lb

## 2020-05-20 DIAGNOSIS — Z6831 Body mass index (BMI) 31.0-31.9, adult: Secondary | ICD-10-CM

## 2020-05-20 DIAGNOSIS — K5909 Other constipation: Secondary | ICD-10-CM

## 2020-05-20 DIAGNOSIS — R7303 Prediabetes: Secondary | ICD-10-CM

## 2020-05-20 DIAGNOSIS — N3281 Overactive bladder: Secondary | ICD-10-CM | POA: Diagnosis not present

## 2020-05-20 DIAGNOSIS — I10 Essential (primary) hypertension: Secondary | ICD-10-CM | POA: Diagnosis not present

## 2020-05-20 DIAGNOSIS — E6609 Other obesity due to excess calories: Secondary | ICD-10-CM

## 2020-05-20 LAB — CMP14+EGFR
ALT: 11 IU/L (ref 0–32)
AST: 16 IU/L (ref 0–40)
Albumin/Globulin Ratio: 1.6 (ref 1.2–2.2)
Albumin: 4.1 g/dL (ref 3.7–4.7)
Alkaline Phosphatase: 120 IU/L (ref 44–121)
BUN/Creatinine Ratio: 14 (ref 12–28)
BUN: 12 mg/dL (ref 8–27)
Bilirubin Total: 0.4 mg/dL (ref 0.0–1.2)
CO2: 26 mmol/L (ref 20–29)
Calcium: 9.8 mg/dL (ref 8.7–10.3)
Chloride: 106 mmol/L (ref 96–106)
Creatinine, Ser: 0.85 mg/dL (ref 0.57–1.00)
GFR calc Af Amer: 79 mL/min/{1.73_m2} (ref >59)
GFR calc non Af Amer: 68 mL/min/{1.73_m2} (ref >59)
Globulin, Total: 2.6 g/dL (ref 1.5–4.5)
Glucose: 114 mg/dL — ABNORMAL HIGH (ref 65–99)
Potassium: 3.7 mmol/L (ref 3.5–5.2)
Sodium: 147 mmol/L — ABNORMAL HIGH (ref 134–144)
Total Protein: 6.7 g/dL (ref 6.0–8.5)

## 2020-05-20 LAB — HEMOGLOBIN A1C
Est. average glucose Bld gHb Est-mCnc: 134 mg/dL
Hgb A1c MFr Bld: 6.3 % — ABNORMAL HIGH (ref 4.8–5.6)

## 2020-05-20 MED ORDER — ATORVASTATIN CALCIUM 20 MG PO TABS
20.0000 mg | ORAL_TABLET | Freq: Every day | ORAL | 2 refills | Status: DC
Start: 2020-05-20 — End: 2020-09-13

## 2020-05-20 MED ORDER — AMLODIPINE BESYLATE 5 MG PO TABS
ORAL_TABLET | ORAL | 2 refills | Status: DC
Start: 2020-05-20 — End: 2021-06-20

## 2020-05-20 NOTE — Patient Instructions (Signed)

## 2020-05-20 NOTE — Progress Notes (Signed)
I,Katawbba Wiggins,acting as a Education administrator for Maximino Greenland, MD.,have documented all relevant documentation on the behalf of Maximino Greenland, MD,as directed by  Maximino Greenland, MD while in the presence of Maximino Greenland, MD.  This visit occurred during the SARS-CoV-2 public health emergency.  Safety protocols were in place, including screening questions prior to the visit, additional usage of staff PPE, and extensive cleaning of exam room while observing appropriate contact time as indicated for disinfecting solutions.  Subjective:     Patient ID: Katherine Summers , female    DOB: May 29, 1946 , 74 y.o.   MRN: [id number]   Chief Complaint  Patient presents with  . Hypertension    HPI  She is here today for a bp check. She denies orthopnea, chest pain and shortness of breath. She reports compliance with meds.   Hypertension This is a chronic problem. The current episode started more than 1 year ago. The problem has been gradually improving since onset. The problem is controlled. Pertinent negatives include no blurred vision, chest pain, palpitations or shortness of breath. The current treatment provides moderate improvement. Compliance problems include exercise.      Past Medical History:  Diagnosis Date  . Allergy   . CKD (chronic kidney disease) stage 2, GFR 60-89 ml/min   . COPD (chronic obstructive pulmonary disease) ([place])   . Depression 08/01/2016  . Dizziness and giddiness   . Essential hypertension 08/01/2016  . Hyperlipidemia   . Hypertension   . Murmur 08/01/2016  . Pre-diabetes   . Shortness of breath 08/01/2016  . Vitamin D deficiency      Family History  Problem Relation Age of Onset  . Stroke Mother   . Colon cancer Father   . Colon cancer Sister   . Stroke Maternal Grandmother   . Colon cancer Paternal Grandfather      Current Outpatient Medications:  .  amLODipine (NORVASC) 5 MG tablet, TAKE 1 TABLET(5 MG) BY MOUTH DAILY, Disp: 90 tablet, Rfl: 2 .   atorvastatin (LIPITOR) 20 MG tablet, Take 1 tablet (20 mg total) by mouth daily., Disp: 90 tablet, Rfl: 2 .  escitalopram (LEXAPRO) 10 MG tablet, TAKE 1 TABLET(10 MG) BY MOUTH DAILY, Disp: 90 tablet, Rfl: 1 .  lisinopril-hydrochlorothiazide (ZESTORETIC) 20-12.5 MG tablet, TAKE 1 TABLET BY MOUTH DAILY, Disp: 90 tablet, Rfl: 2 .  VITAMIN D PO, Take 5,000 mg by mouth. Take 1 daily mon through fri, Disp: , Rfl:  .  Melatonin 2.5 MG CAPS, One at bedtime po (Patient not taking: Reported on 05/20/2020), Disp: 30 capsule, Rfl: 0 .  naproxen (NAPROSYN) 500 MG tablet, Take 1 tablet by mouth every 12 (twelve) hours as needed. (Patient not taking: Reported on 05/20/2020), Disp: , Rfl:  .  nystatin (NYSTATIN) powder, Apply topically 4 (four) times daily. (Patient not taking: Reported on 05/20/2020), Disp: 15 g, Rfl: 0   No Known Allergies   Review of Systems  Constitutional: Negative.   Eyes: Negative for blurred vision.  Respiratory: Negative.  Negative for shortness of breath.   Cardiovascular: Negative.  Negative for chest pain and palpitations.  Gastrointestinal: Positive for constipation.  Genitourinary: Positive for frequency.  Psychiatric/Behavioral: Negative.   All other systems reviewed and are negative.    Today's Vitals   05/20/20 0846  BP: 118/70  Pulse: 77  Temp: 98.6 F (37 C)  TempSrc: Oral  Weight: 190 lb 3.2 oz (86.3 kg)  Height: 5' 5.5" (1.664 m)  PainSc: 0-No pain  Body mass index is 31.17 kg/m.  Wt Readings from Last 3 Encounters:  05/20/20 190 lb 3.2 oz (86.3 kg)  01/01/20 192 lb (87.1 kg)  11/12/19 186 lb 9.6 oz (84.6 kg)   Objective:  Physical Exam Vitals and nursing note reviewed.  Constitutional:      Appearance: Normal appearance. She is obese.  HENT:     Head: Normocephalic and atraumatic.  Cardiovascular:     Rate and Rhythm: Normal rate and regular rhythm.     Heart sounds: Normal heart sounds.  Pulmonary:     Breath sounds: Normal breath sounds.   Skin:    General: Skin is warm.  Neurological:     General: No focal deficit present.     Mental Status: She is alert and oriented to person, place, and time.         Assessment And Plan:     1. Essential hypertension Comments: Chronic, well controlled. She will continue with current meds. She is encouraged to avoid adding salt to her  I will check renal function today. F/u in 6 months - CMP14+EGFR  2. Prediabetes Comments: Her a1c has been elevated in the past. I will check repeat a1c today, encouraged to limit her intake of sugary beverages and processed foods.  - Hemoglobin A1c  3. Overactive bladder Comments: Chronic. She wants to stop oxybutynin b/c she reports it is ineffective. She was given samples of Myrbetriq 100m to take once daily.   4. Chronic constipation Comments: Encouraged to stay well hydrated. Could also benefit from magnesium supplementation nightly. Will try Miralax prn.   5. Class 1 obesity due to excess calories with serious comorbidity and body mass index (BMI) of 31.0 to 31.9 in adult Comments: Encouraged to aim for BMI less than 30 to decrease cardiac risk. Advised to continue with her gym routine.   She is encouraged to strive for BMI less than 30 to decrease cardiac risk. Advised to aim for at least 150 minutes of exercise per week.   Patient was given opportunity to ask questions. Patient verbalized understanding of the plan and was able to repeat key elements of the plan. All questions were answered to their satisfaction.  RMaximino Greenland MD   I, RMaximino Greenland MD, have reviewed all documentation for this visit. The documentation on 05/23/20 for the exam, diagnosis, procedures, and orders are all accurate and complete.  THE PATIENT IS ENCOURAGED TO PRACTICE SOCIAL DISTANCING DUE TO THE COVID-19 PANDEMIC.

## 2020-05-21 ENCOUNTER — Telehealth: Payer: Self-pay

## 2020-05-21 ENCOUNTER — Ambulatory Visit: Payer: Medicare Other

## 2020-05-21 DIAGNOSIS — G4733 Obstructive sleep apnea (adult) (pediatric): Secondary | ICD-10-CM

## 2020-05-21 DIAGNOSIS — I10 Essential (primary) hypertension: Secondary | ICD-10-CM

## 2020-05-21 NOTE — Chronic Care Management (AMB) (Signed)
Chronic Care Management    Social Work Follow Up Note  05/21/2020 Name: Katherine Summers MRN: [id number] DOB: 1946/06/11  Katherine Summers is a 74 y.o. year old female who is a primary care patient of Dorothyann Peng, MD. The CCM team was consulted for assistance with care coordination.   Review of patient status, including review of consultants reports, other relevant assessments, and collaboration with appropriate care team members and the patient's provider was performed as part of comprehensive patient evaluation and provision of chronic care management services.    SDOH (Social Determinants of Health) assessments performed: No    Outpatient Encounter Medications as of 05/21/2020  Medication Sig  . amLODipine (NORVASC) 5 MG tablet TAKE 1 TABLET(5 MG) BY MOUTH DAILY  . atorvastatin (LIPITOR) 20 MG tablet Take 1 tablet (20 mg total) by mouth daily.  Marland Kitchen escitalopram (LEXAPRO) 10 MG tablet TAKE 1 TABLET(10 MG) BY MOUTH DAILY  . lisinopril-hydrochlorothiazide (ZESTORETIC) 20-12.5 MG tablet TAKE 1 TABLET BY MOUTH DAILY  . Melatonin 2.5 MG CAPS One at bedtime po (Patient not taking: Reported on 05/20/2020)  . naproxen (NAPROSYN) 500 MG tablet Take 1 tablet by mouth every 12 (twelve) hours as needed. (Patient not taking: Reported on 05/20/2020)  . nystatin (NYSTATIN) powder Apply topically 4 (four) times daily. (Patient not taking: Reported on 05/20/2020)  . VITAMIN D PO Take 5,000 mg by mouth. Take 1 daily mon through fri   No facility-administered encounter medications on file as of 05/21/2020.     Goals Addressed              This Visit's Progress     Patient Stated   .  COMPLETED: "I fall a lot" (pt-stated)        Current Barriers:  . "stiffness" reported after lack of exercise since knee replacement "two years ago" . Lives alone . Inconsistent use of cane  Clinical Social Work Clinical Goal(s):  Marland Kitchen Over the next 60 days, patient will work with embedded CCM team to address needs  related to fall intervention  05/21/20- SW outreached patient to determine she is active with [employer] and has not experienced recent falls  CCM SW Interventions: Completed 03/18/2019 . Patient interviewed and appropriate assessments performed - the patient reports she lives alone and falls often.  . Determined the patient last fell "two days ago" . Assessed for assistive devices used in the home - the patient reports she has a cane which she uses while out of the home. The patient indicates she has just started using a cane inside of her home after her children have encouraged her to . The patient denies ownership of a life alert and does report at times she must call someone to help her get off the floor . Advised patient to keep her phone on her at all times in the event she falls and needs help . Educated the patient on options to request orders for home health PT to assist with strength and balancing - the patient declines this option today due to rising concerns with COVID 19 . Advised the patient embedded RN Case Manager Lawanna Kobus Little would follow up with her at a later date to complete a telephonic fall risk assessment  . Collaborated with RN Case Manager re: patient enrollment and stated goal  Patient Self Care Activities:  . Self administers medications as prescribed . Attends all scheduled provider appointments . Calls provider office for new concerns or questions  Initial goal documentation     .  "  I might need a new face mask" (pt-stated)   On track     Current Barriers:  Marland Kitchen Knowledge Deficits related to mask options for use of CPAP . Chronic Disease Management support and education needs related to OSA with CPAP, COPD, HTN, Pre-diabetes  Nurse Case Manager Clinical Goal(s):  Marland Kitchen Over the next 90 days, patient will work with CCM RN and PCP to address needs related to disease education and support to help improve efficacy of CPAP use with properly fitting mask . Over the  next 90 days, patient will f/u with her prescribing MD to discuss fitting of new mask (nasal pillow) . New 05/21/20- Over the next 60 days the patient will work with RN Care Manager to become more knowledgeable of proper fit and use of new CPAP face mask  CCM SW Interventions: Completed 05/21/20  . Successful outbound call placed to the patient to assess goal progression . Determined the patient did receive a new face mask but has yet to begin using it o "I got a new mask but haven't hooked it up yet. I am worried I will mess it up" o The patient reports she continues to get a red alert when using her current mask due to the seal not working around her nose . Discussed the patient has a planned in home visit from her [facility] nurse scheduled for today, 05/21/20 o Encouraged the patient to request assistance with hooking up her new mask . Advised the patient SW would provide Delsa Sale, RN Care Manager with update of goal progression . Collaboration with RN Care Manager to inform of above interventions and plan  RN Care Manager Interventions:  . Evaluation of current treatment plan related to OSA/CPAP and patient's adherence to plan as established by provider. . Advised patient to contact her prescribing MD for her CPAP to advise she would like to consider switching her CPAP full face mask for a nasal pillow with chin strap . Provided education to patient re: the importance of having a CPAP mask that fits securely but comfortably for best effectiveness of this treatment and to help improve adherence to following MD recommendations for her CPAP use . Discussed plans with patient for ongoing care management follow up and provided patient with direct contact information for care management team . Provided patient with printed educational materials related to Nasal Pillow Mask  Patient Self Care Activities:  . Patient verbalizes understanding of plan to contact her prescribing MD to discuss switching her  full face mask for the nasal pillow  . Self administers medications as prescribed . Attends all scheduled provider appointments . Calls pharmacy for medication refills . Performs ADL's independently . Performs IADL's independently . Calls provider office for new concerns or questions  Please see past updates related to this goal by clicking on the "Past Updates" button in the selected goal          Follow Up Plan: No SW follow up planned at this time. The patient will remain engaged with RN Care Manager to address chronic care management needs.   [name], BSW, CDP Social Worker, Certified Dementia Practitioner [facility] Care Management [phone]  Total time spent performing care coordination and/or care management activities with the patient by phone or face to face = 15 minutes.

## 2020-05-21 NOTE — Patient Instructions (Signed)
Social Worker Visit Information  Goals we discussed today:  Goals Addressed              This Visit's Progress     Patient Stated   .  COMPLETED: "I fall a lot" (pt-stated)        Current Barriers:  . "stiffness" reported after lack of exercise since knee replacement "two years ago" . Lives alone . Inconsistent use of cane  Clinical Social Work Clinical Goal(s):  Marland Kitchen Over the next 60 days, patient will work with embedded CCM team to address needs related to fall intervention  05/21/20- SW outreached patient to determine she is active with [employer] and has not experienced recent falls  CCM SW Interventions: Completed 03/18/2019 . Patient interviewed and appropriate assessments performed - the patient reports she lives alone and falls often.  . Determined the patient last fell "two days ago" . Assessed for assistive devices used in the home - the patient reports she has a cane which she uses while out of the home. The patient indicates she has just started using a cane inside of her home after her children have encouraged her to . The patient denies ownership of a life alert and does report at times she must call someone to help her get off the floor . Advised patient to keep her phone on her at all times in the event she falls and needs help . Educated the patient on options to request orders for home health PT to assist with strength and balancing - the patient declines this option today due to rising concerns with COVID 19 . Advised the patient embedded RN Case Manager Lawanna Kobus Little would follow up with her at a later date to complete a telephonic fall risk assessment  . Collaborated with RN Case Manager re: patient enrollment and stated goal  Patient Self Care Activities:  . Self administers medications as prescribed . Attends all scheduled provider appointments . Calls provider office for new concerns or questions  Initial goal documentation     .  "I might need a new  face mask" (pt-stated)   On track     Current Barriers:  Marland Kitchen Knowledge Deficits related to mask options for use of CPAP . Chronic Disease Management support and education needs related to OSA with CPAP, COPD, HTN, Pre-diabetes  Nurse Case Manager Clinical Goal(s):  Marland Kitchen Over the next 90 days, patient will work with CCM RN and PCP to address needs related to disease education and support to help improve efficacy of CPAP use with properly fitting mask . Over the next 90 days, patient will f/u with her prescribing MD to discuss fitting of new mask (nasal pillow) . New 05/21/20- Over the next 60 days the patient will work with RN Care Manager to become more knowledgeable of proper fit and use of new CPAP face mask  CCM SW Interventions: Completed 05/21/20  . Successful outbound call placed to the patient to assess goal progression . Determined the patient did receive a new face mask but has yet to begin using it o "I got a new mask but haven't hooked it up yet. I am worried I will mess it up" o The patient reports she continues to get a red alert when using her current mask due to the seal not working around her nose . Discussed the patient has a planned in home visit from her [facility] nurse scheduled for today, 05/21/20 o Encouraged the patient to request assistance  with hooking up her new mask . Advised the patient SW would provide Delsa Sale, RN Care Manager with update of goal progression . Collaboration with RN Care Manager to inform of above interventions and plan  RN Care Manager Interventions:  . Evaluation of current treatment plan related to OSA/CPAP and patient's adherence to plan as established by provider. . Advised patient to contact her prescribing MD for her CPAP to advise she would like to consider switching her CPAP full face mask for a nasal pillow with chin strap . Provided education to patient re: the importance of having a CPAP mask that fits securely but comfortably for best  effectiveness of this treatment and to help improve adherence to following MD recommendations for her CPAP use . Discussed plans with patient for ongoing care management follow up and provided patient with direct contact information for care management team . Provided patient with printed educational materials related to Nasal Pillow Mask  Patient Self Care Activities:  . Patient verbalizes understanding of plan to contact her prescribing MD to discuss switching her full face mask for the nasal pillow  . Self administers medications as prescribed . Attends all scheduled provider appointments . Calls pharmacy for medication refills . Performs ADL's independently . Performs IADL's independently . Calls provider office for new concerns or questions  Please see past updates related to this goal by clicking on the "Past Updates" button in the selected goal          Follow Up Plan: No SW follow up planned at this time. The patient will remain active with RN Care Manager.   [name], BSW, CDP Social Worker, Certified Dementia Practitioner [facility] Care Management [phone]

## 2020-06-07 ENCOUNTER — Ambulatory Visit: Payer: Medicare Other | Admitting: Podiatry

## 2020-06-08 ENCOUNTER — Ambulatory Visit (INDEPENDENT_AMBULATORY_CARE_PROVIDER_SITE_OTHER): Payer: Medicare Other | Admitting: Podiatry

## 2020-06-08 ENCOUNTER — Ambulatory Visit (INDEPENDENT_AMBULATORY_CARE_PROVIDER_SITE_OTHER): Payer: Medicare Other

## 2020-06-08 ENCOUNTER — Other Ambulatory Visit: Payer: Self-pay | Admitting: Podiatry

## 2020-06-08 ENCOUNTER — Other Ambulatory Visit: Payer: Self-pay

## 2020-06-08 DIAGNOSIS — M2142 Flat foot [pes planus] (acquired), left foot: Secondary | ICD-10-CM

## 2020-06-08 DIAGNOSIS — M2141 Flat foot [pes planus] (acquired), right foot: Secondary | ICD-10-CM

## 2020-06-08 DIAGNOSIS — M216X2 Other acquired deformities of left foot: Secondary | ICD-10-CM | POA: Diagnosis not present

## 2020-06-08 DIAGNOSIS — M722 Plantar fascial fibromatosis: Secondary | ICD-10-CM

## 2020-06-08 DIAGNOSIS — M79672 Pain in left foot: Secondary | ICD-10-CM

## 2020-06-08 DIAGNOSIS — M21861 Other specified acquired deformities of right lower leg: Secondary | ICD-10-CM

## 2020-06-08 DIAGNOSIS — M79671 Pain in right foot: Secondary | ICD-10-CM

## 2020-06-08 DIAGNOSIS — M216X1 Other acquired deformities of right foot: Secondary | ICD-10-CM | POA: Diagnosis not present

## 2020-06-08 DIAGNOSIS — M21862 Other specified acquired deformities of left lower leg: Secondary | ICD-10-CM

## 2020-06-08 NOTE — Progress Notes (Signed)
  Subjective:  Patient ID: Katherine Summers, female    DOB: 10-11-1945,  MRN: [id number]  Chief Complaint  Patient presents with  . Foot Pain    Pt stated that her heels have been hurting since last thursday and for the last two days she hasnt been able to walk without using her walker     74 y.o. female presents with the above complaint. History confirmed with patient.  This is a new issue for her it began after she went to the gym.  She is a member of the [employer] and had done a lot of laps around the track.  Objective:  Physical Exam: warm, good capillary refill, no trophic changes or ulcerative lesions, normal DP and PT pulses and normal sensory exam. Left Foot: point tenderness over the heel pad, very sharp and painful for her Right Foot: point tenderness over the heel pad, less so than the left side  No images are attached to the encounter.  Radiographs: X-ray of both feet: no fracture, dislocation, swelling or degenerative changes noted, plantar calcaneal spur, posterior calcaneal spur and pes planus Assessment:   1. Heel pain, bilateral   2. Plantar fasciitis of left foot   3. Plantar fasciitis of right foot   4. Pes planus of both feet   5. Gastrocnemius equinus of left lower extremity   6. Gastrocnemius equinus of right lower extremity      Plan:  Patient was evaluated and treated and all questions answered.  Discussed the etiology and treatment options for plantar fasciitis including stretching, formal physical therapy, supportive shoegears such as a running shoe or sneaker, pre fabricated orthoses, injection therapy, and oral medications. We also discussed the role of surgical treatment of this for patients who do not improve after exhausting non-surgical treatment options.   -She has been advised not to take NSAIDs and I did not prescribe any today.  Recommended Tylenol for pain control -Recommend icing and stretching reviewed this with her and gave  instructions for these. -Recommend that she avoid track exercise for the next 2 weeks and rest.  Okay to continue upper body and core exercises seated -Plantar fascial braces x2 were dispensed -X-ray reviewed with patient in detail -Injection of the left side was performed.  The right side was less painful and elected not to need injection today.  After sterile prep with povidone-iodine solution and alcohol, the left heel was injected with 0.5cc 2% xylocaine plain, 0.5cc 0.5% marcaine plain, 5mg  triamcinolone acetonide, and 2mg  dexamethasone was injected along the plantar fascia at the insertion on the plantar calcaneus. The patient tolerated the procedure well without complication.     Return in about 6 weeks (around 07/20/2020) for recheck plantar fasciitis.

## 2020-06-08 NOTE — Patient Instructions (Signed)

## 2020-06-14 ENCOUNTER — Telehealth: Payer: Self-pay

## 2020-06-14 NOTE — Telephone Encounter (Signed)
The pt was told that DR. Allyne Gee said that she can't take Motrin or Aleve for her pain for her to try Tylenol arthritis.  The pt said that she has been using the tylenol and it's not helping.

## 2020-06-15 ENCOUNTER — Telehealth: Payer: Self-pay

## 2020-06-15 NOTE — Telephone Encounter (Signed)
-----   Message from Dorothyann Peng, MD sent at 06/15/2020  3:03 PM EDT ----- Try Aleve once daily, let me know how this works.  ----- Message ----- From: Mariam Dollar, CMA Sent: 06/14/2020   5:04 PM EDT To: Dorothyann Peng, MD  The pt said that she has tried Tylenol arthritis to help her foot and is there anything else since she can't take Motrin or Aleve

## 2020-06-15 NOTE — Telephone Encounter (Signed)
Patient notified

## 2020-06-28 ENCOUNTER — Encounter: Payer: Self-pay | Admitting: Podiatry

## 2020-06-28 ENCOUNTER — Ambulatory Visit: Payer: Medicare Other

## 2020-06-28 ENCOUNTER — Ambulatory Visit (INDEPENDENT_AMBULATORY_CARE_PROVIDER_SITE_OTHER): Payer: Medicare Other | Admitting: Podiatry

## 2020-06-28 ENCOUNTER — Other Ambulatory Visit: Payer: Self-pay

## 2020-06-28 DIAGNOSIS — M79675 Pain in left toe(s): Secondary | ICD-10-CM | POA: Diagnosis not present

## 2020-06-28 DIAGNOSIS — M79674 Pain in right toe(s): Secondary | ICD-10-CM | POA: Diagnosis not present

## 2020-06-28 DIAGNOSIS — B351 Tinea unguium: Secondary | ICD-10-CM | POA: Diagnosis not present

## 2020-06-28 DIAGNOSIS — M2141 Flat foot [pes planus] (acquired), right foot: Secondary | ICD-10-CM

## 2020-06-28 DIAGNOSIS — M2142 Flat foot [pes planus] (acquired), left foot: Secondary | ICD-10-CM

## 2020-06-28 DIAGNOSIS — M2011 Hallux valgus (acquired), right foot: Secondary | ICD-10-CM

## 2020-07-03 NOTE — Progress Notes (Signed)
Subjective: Katherine Summers is a 74 y.o. female patient seen today painful mycotic nails b/l that are difficult to trim. Pain interferes with ambulation. Aggravating factors include wearing enclosed shoe gear. Pain is relieved with periodic professional debridement.  Patient has seen Dr. Lilian Kapur for plantar fasciitis and received injection of left heel, bilateral plantar fascial braces, and  stretching and icing exercises. She will follow up with him to track her progress.  Patient Active Problem List   Diagnosis Date Noted  . Urinary frequency 10/31/2018  . Prediabetes 10/31/2018  . OSA on CPAP 07/22/2018  . CPAP use counseling 07/22/2018  . Abnormal dreams 07/22/2018  . Chronic obstructive pulmonary disease (HCC) 03/21/2018  . Other fatigue 03/21/2018  . Snorings 03/21/2018  . Nocturia more than twice per night 03/21/2018  . 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 Overweight (BMI 25.0-29.9) 11/01/2016  . 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 Murmur 08/01/2016  . Shortness of breath 08/01/2016  . Abnormal EKG 08/01/2016  . Essential hypertension 08/01/2016  . Depression 08/01/2016    Current Outpatient Medications on File Prior to Visit  Medication Sig Dispense Refill  . amLODipine (NORVASC) 5 MG tablet TAKE 1 TABLET(5 MG) BY MOUTH DAILY 90 tablet 2  . atorvastatin (LIPITOR) 20 MG tablet Take 1 tablet (20 mg total) by mouth daily. 90 tablet 2  . escitalopram (LEXAPRO) 10 MG tablet TAKE 1 TABLET(10 MG) BY MOUTH DAILY 90 tablet 1  . lisinopril-hydrochlorothiazide (ZESTORETIC) 20-12.5 MG tablet TAKE 1 TABLET BY MOUTH DAILY 90 tablet 2  . Melatonin 2.5 MG CAPS One at bedtime po 30 capsule 0  . naproxen (NAPROSYN) 500 MG tablet Take 1 tablet by mouth every 12 (twelve) hours as needed.     . nystatin (NYSTATIN) powder Apply topically 4 (four) times daily. 15 g 0  . prednisoLONE acetate (PRED FORTE) 1 % ophthalmic suspension     . VITAMIN D PO Take 5,000 mg by mouth. Take 1 daily mon through fri      No current facility-administered medications on file prior to visit.    No Known Allergies  Objective: Physical Exam  General: Katherine Summers is a pleasant 74 y.o.  African American female, in NAD. AAO x 3.   Vascular:  Neurovascular status unchanged b/l. Capillary fill time to digits <3 seconds b/l. Palpable DP pulses b/l. Palpable PT pulses b/l. Pedal hair sparse b/l. Skin temperature gradient within normal limits b/l.  Dermatological:  Pedal skin is thin shiny, atrophic bilaterally. No open wounds bilaterally. No interdigital macerations bilaterally. Toenails 1-5 b/l elongated, dystrophic, thickened, crumbly with subungual debris and tenderness to dorsal palpation.  Musculoskeletal:  Normal muscle strength 5/5 to all lower extremity muscle groups bilaterally. Muscle strength 5/5 to all LE muscle groups of b/l lower extremities. No pain crepitus or joint limitation noted with ROM b/l. Pes planus deformity noted b/l.  Utilizes cane for ambulation assistance.   Neurological:  Protective sensation intact 5/5 intact bilaterally with 10g monofilament b/l. Vibratory sensation intact b/l.  Assessment and Plan:  1. Pain due to onychomycosis of toenails of both feet   2. Acquired hallux valgus of right foot   3. Pes planus of both feet    -Examined patient. -Toenails 1-5 b/l were debrided in length and girth with sterile nail nippers and dremel without iatrogenic bleeding.  -Patient to continue soft, supportive shoe gear daily. -Patient to report any pedal injuries to medical professional immediately. -Patient/POA to call should there be question/concern in the interim.  Return in about 3 months (around 09/28/2020) for 3 month toenail debridement.  Freddie Breech, DPM

## 2020-07-05 ENCOUNTER — Ambulatory Visit: Payer: Medicare Other | Attending: Internal Medicine

## 2020-07-05 DIAGNOSIS — Z23 Encounter for immunization: Secondary | ICD-10-CM

## 2020-07-05 NOTE — Progress Notes (Signed)
° °  Covid-19 Vaccination Clinic  Name:  Katherine Summers    MRN: [id number] DOB: 08-Jun-1946  07/05/2020  [name] was observed post Covid-19 immunization for 15 minutes without incident. She was provided with Vaccine Information Sheet and instruction to access the V-Safe system.   [name] was instructed to call 911 with any severe reactions post vaccine:  Difficulty breathing   Swelling of face and throat   A fast heartbeat   A bad rash all over body   Dizziness and weakness

## 2020-07-20 ENCOUNTER — Other Ambulatory Visit: Payer: Self-pay

## 2020-07-20 ENCOUNTER — Encounter: Payer: Self-pay | Admitting: Podiatry

## 2020-07-20 ENCOUNTER — Ambulatory Visit (INDEPENDENT_AMBULATORY_CARE_PROVIDER_SITE_OTHER): Payer: Medicare Other | Admitting: Podiatry

## 2020-07-20 DIAGNOSIS — M722 Plantar fascial fibromatosis: Secondary | ICD-10-CM

## 2020-07-20 NOTE — Patient Instructions (Addendum)
Look for Voltaren gel at the pharmacy over the counter or online (also known as diclofenac 1% gel). Apply to the painful areas 3-4x daily with the supplied dosing card. Allow to dry for 10 minutes before going into socks/shoes    Check for your expiration on your prednisone prescription, if it is out of date, please call me and I will send you more

## 2020-07-20 NOTE — Progress Notes (Signed)
  Subjective:  Patient ID: Katherine Summers, female    DOB: 06/03/1946,  MRN: [id number]  Chief Complaint  Patient presents with  . Foot Pain    left heel pain. PT stated that the pain is still there and the injection did not help last time     74 y.o. female presents with the above complaint. History confirmed with patient.  This is a new issue for her it began after she went to the gym.  She is a member of the [employer] and had done a lot of laps around the track.  Objective:  Physical Exam: warm, good capillary refill, no trophic changes or ulcerative lesions, normal DP and PT pulses and normal sensory exam. Left Foot: point tenderness over the heel pad, very sharp and painful for her Right Foot: point tenderness over the heel pad, less so than the left side  No images are attached to the encounter.  Radiographs: X-ray of both feet: no fracture, dislocation, swelling or degenerative changes noted, plantar calcaneal spur, posterior calcaneal spur and pes planus Assessment:   1. Plantar fasciitis of left foot      Plan:  Patient was evaluated and treated and all questions answered.  Discussed the etiology and treatment options for plantar fasciitis including stretching, formal physical therapy, supportive shoegears such as a running shoe or sneaker, pre fabricated orthoses, injection therapy, and oral medications. We also discussed the role of surgical treatment of this for patients who do not improve after exhausting non-surgical treatment options.   -Recommended Voltaren use -I think she would benefit from physical therapy. Referral sent to [facility] -Continue icing and stretching  - Continue use of PF braces    Return in about 2 months (around 09/19/2020) for recheck plantar fasciitis.

## 2020-07-21 ENCOUNTER — Telehealth: Payer: Self-pay | Admitting: Podiatry

## 2020-07-21 MED ORDER — METHYLPREDNISOLONE 4 MG PO TBPK: ORAL_TABLET | ORAL | 0 refills | Status: DC

## 2020-07-21 NOTE — Telephone Encounter (Signed)
Patient called In wanting to know if its ok to take expired prednisolone until she can get another prescription, or could you prescribe more please per patient, Please advise

## 2020-07-21 NOTE — Telephone Encounter (Signed)
No, advise her not to take it, and I will send a new Rx taper to her pharmacy. Thanks!

## 2020-07-21 NOTE — Addendum Note (Signed)
Addended byLilian Kapur, [place] R on: 07/21/2020 10:19 AM   Modules accepted: Orders

## 2020-07-28 ENCOUNTER — Telehealth: Payer: Medicare Other

## 2020-09-12 ENCOUNTER — Other Ambulatory Visit: Payer: Self-pay | Admitting: Internal Medicine

## 2020-09-17 ENCOUNTER — Other Ambulatory Visit: Payer: Self-pay | Admitting: Internal Medicine

## 2020-09-17 NOTE — Telephone Encounter (Signed)
Please refill patient's prescription YL,RMA 

## 2020-09-20 ENCOUNTER — Ambulatory Visit: Payer: Medicare Other | Admitting: Podiatry

## 2020-09-21 ENCOUNTER — Ambulatory Visit: Payer: Self-pay

## 2020-09-21 ENCOUNTER — Other Ambulatory Visit: Payer: Self-pay

## 2020-09-21 ENCOUNTER — Telehealth: Payer: Medicare Other

## 2020-09-21 DIAGNOSIS — M79671 Pain in right foot: Secondary | ICD-10-CM

## 2020-09-21 DIAGNOSIS — N3281 Overactive bladder: Secondary | ICD-10-CM

## 2020-09-21 DIAGNOSIS — I10 Essential (primary) hypertension: Secondary | ICD-10-CM

## 2020-09-21 DIAGNOSIS — G4733 Obstructive sleep apnea (adult) (pediatric): Secondary | ICD-10-CM

## 2020-09-21 DIAGNOSIS — R7303 Prediabetes: Secondary | ICD-10-CM

## 2020-09-21 DIAGNOSIS — Z9989 Dependence on other enabling machines and devices: Secondary | ICD-10-CM

## 2020-09-22 ENCOUNTER — Telehealth: Payer: Self-pay | Admitting: *Deleted

## 2020-09-22 NOTE — Chronic Care Management (AMB) (Signed)
  Chronic Care Management   Note  09/22/2020 Name: Katherine Summers MRN: [id number] DOB: 03/01/1946  Katherine Summers is a 75 y.o. year old female who is a primary care patient of Glendale Chard, MD. Katherine Summers is currently enrolled in care management services. An additional referral for Pharmacy was placed.   [name] was given information about Chronic Care Management services today including:  1. CCM service includes personalized support from designated clinical staff supervised by her physician, including individualized plan of care and coordination with other care providers 2. 24/7 contact phone numbers for assistance for urgent and routine care needs. 3. Service will only be billed when office clinical staff spend 20 minutes or more in a month to coordinate care. 4. Only one practitioner may furnish and bill the service in a calendar month. 5. The patient may stop CCM services at any time (effective at the end of the month) by phone call to the office staff. 6. The patient will be responsible for cost sharing (co-pay) of up to 20% of the service fee (after annual deductible is met).  Patient agreed to services and verbal consent obtained.   Follow up plan: Telephone appointment with care management team member scheduled for:10/19/2020  [place], [place] [facility]  [place], [place] [address] Direct Dial: [email or web address]: [email or web address]

## 2020-09-22 NOTE — Chronic Care Management (AMB) (Signed)
Chronic Care Management   CCM RN Visit Note  09/21/2020 Name: Katherine Summers MRN: [id number] DOB: July 29, 1946  Subjective: Katherine Summers is a 75 y.o. year old female who is a primary care patient of Glendale Chard, MD. The care management team was consulted for assistance with disease management and care coordination needs.    Engaged with patient by telephone for follow up visit in response to provider referral for case management and/or care coordination services.   Consent to Services:  The patient was given information about Chronic Care Management services, agreed to services, and gave verbal consent prior to initiation of services.  Please see initial visit note for detailed documentation.   Patient agreed to services and verbal consent obtained.   Assessment: Review of patient past medical history, allergies, medications, health status, including review of consultants reports, laboratory and other test data, was performed as part of comprehensive evaluation and provision of chronic care management services.   SDOH (Social Determinants of Health) assessments and interventions performed:  Yes, no acute needs   CCM Care Plan  No Known Allergies  Outpatient Encounter Medications as of 09/21/2020  Medication Sig  . amLODipine (NORVASC) 5 MG tablet TAKE 1 TABLET(5 MG) BY MOUTH DAILY  . atorvastatin (LIPITOR) 20 MG tablet TAKE 1 TABLET BY MOUTH EVERY DAY  . escitalopram (LEXAPRO) 10 MG tablet TAKE 1 TABLET(10 MG) BY MOUTH DAILY  . lisinopril-hydrochlorothiazide (ZESTORETIC) 20-12.5 MG tablet TAKE 1 TABLET BY MOUTH DAILY  . Melatonin 2.5 MG CAPS One at bedtime po  . methylPREDNISolone (MEDROL DOSEPAK) 4 MG TBPK tablet 6 day dose pack - take as directed  . naproxen (NAPROSYN) 500 MG tablet Take 1 tablet by mouth every 12 (twelve) hours as needed.   . nystatin (NYSTATIN) powder Apply topically 4 (four) times daily.  . prednisoLONE acetate (PRED FORTE) 1 % ophthalmic suspension   .  VITAMIN D PO Take 5,000 mg by mouth. Take 1 daily mon through fri   No facility-administered encounter medications on file as of 09/21/2020.    Patient Active Problem List   Diagnosis Date Noted  . Urinary frequency 10/31/2018  . Prediabetes 10/31/2018  . OSA on CPAP 07/22/2018  . CPAP use counseling 07/22/2018  . Abnormal dreams 07/22/2018  . Chronic obstructive pulmonary disease ([place]) 03/21/2018  . Other fatigue 03/21/2018  . Snorings 03/21/2018  . Nocturia more than twice per night 03/21/2018  . 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 Overweight (BMI 25.0-29.9) 11/01/2016  . 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 Murmur 08/01/2016  . Shortness of breath 08/01/2016  . Abnormal EKG 08/01/2016  . Essential hypertension 08/01/2016  . Depression 08/01/2016    Conditions to be addressed/monitored:HTN, COPD, Prediabetes, Overactive bladder, Foot pain, bilateral  Care Plan : Wellness (Adult)  Updates made by Lynne Logan, RN since 09/22/2020 12:00 AM    Problem: Medication Adherence (Wellness)   Priority: High    Long-Range Goal: Medication Adherence Maintained   Start Date: 09/21/2020  Expected End Date: 12/20/2020  This Visit's Progress: On track  Priority: High  Note:   Current Barriers:   Ineffective Self Health Maintenance  Unable to afford medication (Atorvastatin)  Currently UNABLE TO independently self manage needs related to chronic health conditions.   Knowledge Deficits related to short term plan for care coordination needs and long term plans for chronic disease management needs Case Manager Clinical Goal(s):  Marland Kitchen Collaboration with Glendale Chard, MD regarding development and update of comprehensive plan of care as  evidenced by provider attestation and co-signature . Inter-disciplinary care team collaboration (see longitudinal plan of care)  Over the next 90 days, patient will work with care management team to address care coordination and chronic disease management  needs related to Disease Management  Educational Needs  Care Coordination  Medication Management and Education  Medication Assistance   Psychosocial Support   Interventions:   Determined patient received a letter advising the cost of her Atorvastatin will increase to over $100 with next refill  Determined patient will not be able to afford to pay this amount and needs help with resources to help lower the cost  Discussed having patient work with the embedded Pharm D to offer assistance  Sent Pharm D referral marked routine priority with request to collaborate with patient regarding cost assistance with Atorvastatin  Patient Goals/Self-Care Activities Over the next 90 days, patient will:  -work with the embedded Pharm D for assistance with cost of Atorvastatin -take all prescription medications with no missed doses -refill medications when supply is getting low prior to running out of medication   Follow Up Plan: Telephone follow up appointment with care management team member scheduled for: 11/02/20    Care Plan : Diabetes Type 2 (Adult)  Updates made by Lynne Logan, RN since 09/22/2020 12:00 AM    Problem: Disease Progression (Diabetes, Type 2)   Priority: Medium    Long-Range Goal: Disease Progression Prevented or Minimized   Start Date: 09/21/2020  Expected End Date: 12/20/2020  This Visit's Progress: On track  Priority: Medium  Note:   Objective:  Lab Results  Component Value Date   HGBA1C 6.3 (H) 05/20/2020 .   Lab Results  Component Value Date   CREATININE 0.85 05/20/2020   CREATININE 0.87 11/12/2019   CREATININE 0.87 03/04/2019 .   Marland Kitchen No results found for: EGFR Current Barriers:  Marland Kitchen Knowledge Deficits related to basic Diabetes pathophysiology and self care/management . Knowledge Deficits related to medications used for management of diabetes . Does not have glucometer to monitor blood sugar Case Manager Clinical Goal(s):  Marland Kitchen Collaboration with Glendale Chard, MD regarding development and update of comprehensive plan of care as evidenced by provider attestation and co-signature . Inter-disciplinary care team collaboration (see longitudinal plan of care) . Over the next 90 days, patient will demonstrate improved adherence to prescribed treatment plan for diabetes self care/management as evidenced by:  . adherence to ADA/ carb modified diet . exercise 3-5 days/week Interventions:  . Provided education to patient about basic DM disease process . Discussed plans with patient for ongoing care management follow up and provided patient with direct contact information for care management team . Provided patient with written educational materials related to hypo and hyperglycemia and importance of correct treatment, Prediabetes Patient Goals/Self-Care Activities . Over the next 90 days, patient will:  Self administers oral medications as prescribed Attends all scheduled provider appointments Adheres to prescribed ADA/carb modified Review and discuss printed DM educational materials at next RN CM f/u call exercise 3-5 days/week   Follow Up Plan: Telephone follow up appointment with care management team member scheduled for: 11/02/20   Care Plan : Hypertension (Adult)  Updates made by Lynne Logan, RN since 09/22/2020 12:00 AM    Problem: Disease Progression (Hypertension)   Priority: Medium    Long-Range Goal: Disease Progression Prevented or Minimized   Start Date: 09/21/2020  Expected End Date: 12/20/2020  This Visit's Progress: On track  Priority: Medium  Note:   Objective:  .  Last practice recorded BP readings:  BP Readings from Last 3 Encounters:  05/20/20 118/70  01/01/20 (!) 142/74  11/12/19 (!) 142/80 .   Marland Kitchen Most recent eGFR/CrCl: No results found for: EGFR  No components found for: CRCL Current Barriers:  Marland Kitchen Knowledge Deficits related to basic understanding of hypertension pathophysiology and self care management . Knowledge  Deficits related to understanding of medications prescribed for management of hypertension . Needs BP cuff for home use Case Manager Clinical Goal(s):  Marland Kitchen Over the next 90 days, patient will demonstrate improved health management independence as evidenced by checking blood pressure as directed and notifying PCP if SBP>140 or DBP > 80, taking all medications as prescribe, and adhering to a low sodium diet as discussed. Interventions:  . Collaboration with Glendale Chard, MD regarding development and update of comprehensive plan of care as evidenced by provider attestation and co-signature . Inter-disciplinary care team collaboration (see longitudinal plan of care) . Evaluation of current treatment plan related to hypertension self management and patient's adherence to plan as established by provider. . Reviewed medications with patient and discussed importance of compliance . Educated on target BP <130/80; Determined patient is not self monitoring her BP at home due to having an older wrist cuff that does not work properly . Determined patient is not aware if she has an OTC benefit through [facility] . Discussed patient working with embedded BSW Kendra Humble to determined benefits for purchase of BP cuff and or to learn patient out of pocket for purchase of BP cuff  . Advised patient, providing education and rationale, to monitor blood pressure daily and record, calling PCP for findings outside established parameters  . Discussed plans with patient for ongoing care management follow up and provided patient with direct contact information for care management team Patient Goals/Self-Care Activities . Over the next 90 days, patient will:  choose a place to take my blood pressure (home, clinic or office, retail store) write blood pressure results in a log or diary  work with the embedded BSW to explore [facility] OTC benefits, if eligible, purchase a BP cuff via this benefit  Self administers medications as  prescribed Attends all scheduled provider appointments Calls provider office for new concerns, questions, or BP outside discussed parameters Checks BP and records as discussed Follows a low sodium diet/DASH diet  Follow Up Plan: Telephone follow up appointment with care management team member scheduled for: 11/02/20   Care Plan : Bladder dysfunction  Updates made by Lynne Logan, RN since 09/22/2020 12:00 AM    Problem: Bladder dysfunction   Priority: Medium    Long-Range Goal: Manage urinary frequency   Start Date: 09/21/2020  Expected End Date: 12/20/2020  This Visit's Progress: On track  Priority: Medium  Note:   Current Barriers:   Ineffective Self Health Maintenance  Currently UNABLE TO independently self manage needs related to chronic health conditions.   Knowledge Deficits related to short term plan for care coordination needs and long term plans for chronic disease management needs Case Manager Clinical Goal(s):  Marland Kitchen Collaboration with Glendale Chard, MD regarding development and update of comprehensive plan of care as evidenced by provider attestation and co-signature . Inter-disciplinary care team collaboration (see longitudinal plan of care)  Over the next 90 days, patient will work with care management team to address care coordination and chronic disease management needs related to Disease Management  Educational Needs  Care Coordination  Medication Management and Education  Psychosocial Support   Interventions:  Determined patient continues to experience urinary frequency  Determined patient reports having ineffectiveness from the medications previously tried, prescribed by PCP  Discussed patient would like to be referred to a Specialist for evaluation and treatment of this condition  Sent in basket message to Dr. Baird Cancer requesting a Urology referral be sent for further evaluation of this condition  Determined PCP will further evaluate and send the  appropriate referral  Notified patient of appointment with PCP Dr. Baird Cancer scheduled for 09/28/20 _0  pm, patient verbalizes understanding  Discussed plans with patient for ongoing care management follow up and provided patient with direct contact information for care management team Patient Goals/Self-Care Activities:  Over the next 90 days, patient will -f/u with Urology Specialist for evaluation and treatment of over active bladder  -call PCP for new or worsening symptoms or concerns related to this condition -take prescription medications exactly as prescribed by MD   Follow Up Plan: Telephone follow up appointment with care management team member scheduled for: 11/02/20     Plan:Telephone follow up appointment with care management team member scheduled for:  11/02/20  Barb Merino, RN, BSN, CCM Care Management Coordinator [place] [facility]  Direct Phone: [phone]

## 2020-09-22 NOTE — Patient Instructions (Signed)
Visit Information Goals      Patient Stated   .  Manage urinary frequency (pt-stated)      Timeframe:  Long-Range Goal Priority:  Medium Start Date:  09/21/20                           Expected End Date: 12/20/20    Follow up Date: 11/02/20  Over the next 90 days, patient will -f/u with Urology Specialist for evaluation and treatment of over active bladder  -call PCP for new or worsening symptoms or concerns related to this condition -take prescription medications exactly as prescribed by MD                          Other   .  Minimize DM disease progression      Timeframe:  Long-Range Goal Priority:  Medium Start Date:  09/21/20                           Expected End Date: 12/20/20  Follow up date: 11/02/20  Over the next 90 days, patient will Self administers oral medications as prescribed Attends all scheduled provider appointments Adheres to prescribed ADA/carb modified Review and discuss printed DM educational materials at next RN CM f/u call  exercise 3-5 days/week                          .  Patient Stated      11/12/2019, wants to eat healthier    .  Track and Manage My Blood Pressure-Hypertension      Timeframe:  Long-Range Goal Priority:  Medium Start Date: 09/21/20                            Expected End Date: 12/20/20                     Follow Up Date 11/02/20   choose a place to take my blood pressure (home, clinic or office, retail store) write blood pressure results in a log or diary  work with the embedded BSW to explore [employer] OTC benefits, if eligible, purchase a BP cuff via this benefit  Self administers medications as prescribed Attends all scheduled provider appointments Calls provider office for new concerns, questions, or BP outside discussed parameters Checks BP and records as discussed Follows a low sodium diet/DASH diet    Why is this important?    You won't feel high blood pressure, but it can still hurt your blood vessels.   High blood pressure  can cause heart or kidney problems. It can also cause a stroke.   Making lifestyle changes like losing a Leldon Steege weight or eating less salt will help.   Checking your blood pressure at home and at different times of the day can help to control blood pressure.   If the doctor prescribes medicine remember to take it the way the doctor ordered.   Call the office if you cannot afford the medicine or if there are questions about it.     Notes:     .  Work with embedded Pharm D for cost assistance of Atorvastatin      Timeframe:  Long-Range Goal Priority:  High Start Date:  09/21/20  Expected End Date:  12/20/20  Follow up Date: 11/02/20  Over the next 90 days, patient will:  -work with the embedded Pharm D for assistance with cost of Atorvastatin -take all prescription medications with no missed doses -refill medications when supply is getting low prior to running out of medication                          Patient Care Plan: Wellness (Adult)    Problem Identified: Medication Adherence (Wellness)   Priority: High    Long-Range Goal: Medication Adherence Maintained   Start Date: 09/21/2020  Expected End Date: 12/20/2020  This Visit's Progress: On track  Priority: High  Note:   Current Barriers:   Ineffective Self Health Maintenance  Unable to afford medication (Atorvastatin)  Currently UNABLE TO independently self manage needs related to chronic health conditions.   Knowledge Deficits related to short term plan for care coordination needs and long term plans for chronic disease management needs Case Manager Clinical Goal(s):  Marland Kitchen Collaboration with Glendale Chard, MD regarding development and update of comprehensive plan of care as evidenced by provider attestation and co-signature . Inter-disciplinary care team collaboration (see longitudinal plan of care)  Over the next 90 days, patient will work with care management team to address care coordination and  chronic disease management needs related to Disease Management  Educational Needs  Care Coordination  Medication Management and Education  Medication Assistance   Psychosocial Support   Interventions:   Determined patient received a letter advising the cost of her Atorvastatin will increase to over $100 with next refill  Determined patient will not be able to afford to pay this amount and needs help with resources to help lower the cost  Discussed having patient work with the embedded Pharm D to offer assistance  Sent Pharm D referral marked routine priority with request to collaborate with patient regarding cost assistance with Atorvastatin  Patient Goals/Self-Care Activities Over the next 90 days, patient will:  -work with the embedded Pharm D for assistance with cost of Atorvastatin -take all prescription medications with no missed doses -refill medications when supply is getting low prior to running out of medication   Follow Up Plan: Telephone follow up appointment with care management team member scheduled for: 11/02/20    Patient Care Plan: Diabetes Type 2 (Adult)    Problem Identified: Disease Progression (Diabetes, Type 2)   Priority: Medium    Long-Range Goal: Disease Progression Prevented or Minimized   Start Date: 09/21/2020  Expected End Date: 12/20/2020  This Visit's Progress: On track  Priority: Medium  Note:   Objective:  Lab Results  Component Value Date   HGBA1C 6.3 (H) 05/20/2020 .   Lab Results  Component Value Date   CREATININE 0.85 05/20/2020   CREATININE 0.87 11/12/2019   CREATININE 0.87 03/04/2019 .   Marland Kitchen No results found for: EGFR Current Barriers:  Marland Kitchen Knowledge Deficits related to basic Diabetes pathophysiology and self care/management . Knowledge Deficits related to medications used for management of diabetes . Does not have glucometer to monitor blood sugar Case Manager Clinical Goal(s):  Marland Kitchen Collaboration with Glendale Chard, MD regarding  development and update of comprehensive plan of care as evidenced by provider attestation and co-signature . Inter-disciplinary care team collaboration (see longitudinal plan of care) . Over the next 90 days, patient will demonstrate improved adherence to prescribed treatment plan for diabetes self care/management as evidenced by:  . adherence to ADA/  carb modified diet . exercise 3-5 days/week Interventions:  . Provided education to patient about basic DM disease process . Discussed plans with patient for ongoing care management follow up and provided patient with direct contact information for care management team . Provided patient with written educational materials related to hypo and hyperglycemia and importance of correct treatment, Prediabetes Patient Goals/Self-Care Activities . Over the next 90 days, patient will:  Self administers oral medications as prescribed Attends all scheduled provider appointments Adheres to prescribed ADA/carb modified Review and discuss printed DM educational materials at next RN CM f/u call exercise 3-5 days/week   Follow Up Plan: Telephone follow up appointment with care management team member scheduled for: 11/02/20   Patient Care Plan: Hypertension (Adult)    Problem Identified: Disease Progression (Hypertension)   Priority: Medium    Long-Range Goal: Disease Progression Prevented or Minimized   Start Date: 09/21/2020  Expected End Date: 12/20/2020  This Visit's Progress: On track  Priority: Medium  Note:   Objective:  . Last practice recorded BP readings:  BP Readings from Last 3 Encounters:  05/20/20 118/70  01/01/20 (!) 142/74  11/12/19 (!) 142/80 .   Marland Kitchen Most recent eGFR/CrCl: No results found for: EGFR  No components found for: CRCL Current Barriers:  Marland Kitchen Knowledge Deficits related to basic understanding of hypertension pathophysiology and self care management . Knowledge Deficits related to understanding of medications prescribed for  management of hypertension . Needs BP cuff for home use Case Manager Clinical Goal(s):  Marland Kitchen Over the next 90 days, patient will demonstrate improved health management independence as evidenced by checking blood pressure as directed and notifying PCP if SBP>140 or DBP > 80, taking all medications as prescribe, and adhering to a low sodium diet as discussed. Interventions:  . Collaboration with Glendale Chard, MD regarding development and update of comprehensive plan of care as evidenced by provider attestation and co-signature . Inter-disciplinary care team collaboration (see longitudinal plan of care) . Evaluation of current treatment plan related to hypertension self management and patient's adherence to plan as established by provider. . Reviewed medications with patient and discussed importance of compliance . Educated on target BP <130/80; Determined patient is not self monitoring her BP at home due to having an older wrist cuff that does not work properly . Determined patient is not aware if she has an OTC benefit through [facility] . Discussed patient working with embedded BSW Kendra Humble to determined benefits for purchase of BP cuff and or to learn patient out of pocket for purchase of BP cuff  . Advised patient, providing education and rationale, to monitor blood pressure daily and record, calling PCP for findings outside established parameters  . Discussed plans with patient for ongoing care management follow up and provided patient with direct contact information for care management team Patient Goals/Self-Care Activities . Over the next 90 days, patient will:  choose a place to take my blood pressure (home, clinic or office, retail store) write blood pressure results in a log or diary  work with the embedded BSW to explore [facility] OTC benefits, if eligible, purchase a BP cuff via this benefit  Self administers medications as prescribed Attends all scheduled provider appointments Calls provider  office for new concerns, questions, or BP outside discussed parameters Checks BP and records as discussed Follows a low sodium diet/DASH diet  Follow Up Plan: Telephone follow up appointment with care management team member scheduled for: 11/02/20   Patient Care Plan: Bladder dysfunction  Problem Identified: Bladder dysfunction   Priority: Medium    Long-Range Goal: Manage urinary frequency   Start Date: 09/21/2020  Expected End Date: 12/20/2020  This Visit's Progress: On track  Priority: Medium  Note:   Current Barriers:   Ineffective Self Health Maintenance  Currently UNABLE TO independently self manage needs related to chronic health conditions.   Knowledge Deficits related to short term plan for care coordination needs and long term plans for chronic disease management needs Case Manager Clinical Goal(s):  Marland Kitchen Collaboration with Glendale Chard, MD regarding development and update of comprehensive plan of care as evidenced by provider attestation and co-signature . Inter-disciplinary care team collaboration (see longitudinal plan of care)  Over the next 90 days, patient will work with care management team to address care coordination and chronic disease management needs related to Disease Management  Educational Needs  Care Coordination  Medication Management and Education  Psychosocial Support   Interventions:   Determined patient continues to experience urinary frequency  Determined patient reports having ineffectiveness from the medications previously tried, prescribed by PCP  Discussed patient would like to be referred to a Specialist for evaluation and treatment of this condition  Sent in basket message to Dr. Baird Cancer requesting a Urology referral be sent for further evaluation of this condition  Determined PCP will further evaluate and send the appropriate referral  Notified patient of appointment with PCP Dr. Baird Cancer scheduled for 09/28/20 $[email or web address]$  pm, patient  verbalizes understanding  Discussed plans with patient for ongoing care management follow up and provided patient with direct contact information for care management team Patient Goals/Self-Care Activities:  Over the next 90 days, patient will -f/u with Urology Specialist for evaluation and treatment of over active bladder  -call PCP for new or worsening symptoms or concerns related to this condition -take prescription medications exactly as prescribed by MD   Follow Up Plan: Telephone follow up appointment with care management team member scheduled for: 11/02/20     The patient verbalized understanding of instructions, educational materials, and care plan provided today and declined offer to receive copy of patient instructions, educational materials, and care plan.   Telephone follow up appointment with care management team member scheduled for: 11/02/20  Lynne Logan, RN

## 2020-09-28 ENCOUNTER — Ambulatory Visit: Payer: Medicare Other | Admitting: Internal Medicine

## 2020-09-30 ENCOUNTER — Ambulatory Visit: Payer: Medicare Other

## 2020-09-30 DIAGNOSIS — I10 Essential (primary) hypertension: Secondary | ICD-10-CM

## 2020-09-30 NOTE — Patient Instructions (Signed)
   Goals we discussed today:  Goals Addressed            This Visit's Progress   . Track and Manage My Blood Pressure-Hypertension       Timeframe:  Long-Range Goal Priority:  Medium Start Date: 09/21/20                            Expected End Date: 12/20/20                     Follow Up Date 11/02/20   Patient Goals/Self-Care Activities . Over the next 90 days, patient will:  choose a place to take my blood pressure (home, clinic or office, retail store) write blood pressure results in a log or diary  work with the embedded BSW to explore [facility] OTC benefits, if eligible, purchase a BP cuff via this benefit -completed 1.27.22 Self administers medications as prescribed Attends all scheduled provider appointments Calls provider office for new concerns, questions, or BP outside discussed parameters Checks BP and records as discussed Follows a low sodium diet/DASH diet    Why is this important?    You won't feel high blood pressure, but it can still hurt your blood vessels.   High blood pressure can cause heart or kidney problems. It can also cause a stroke.   Making lifestyle changes like losing a little weight or eating less salt will help.   Checking your blood pressure at home and at different times of the day can help to control blood pressure.   If the doctor prescribes medicine remember to take it the way the doctor ordered.   Call the office if you cannot afford the medicine or if there are questions about it.     Notes:

## 2020-09-30 NOTE — Chronic Care Management (AMB) (Signed)
Chronic Care Management    Social Work Note  09/30/2020 Name: Katherine Summers MRN: [id number] DOB: [birth year]  MARGUETTA WINDISH is a 75 y.o. year old female who is a primary care patient of Dorothyann Peng, MD. The CCM team was consulted to assist the patient with chronic disease management and/or care coordination needs related to: [facility] .   Engaged with patient by telephone for follow up visit in response to provider referral for social work chronic care management and care coordination services.   Consent to Services:  The patient was given information about Chronic Care Management services, agreed to services, and gave verbal consent prior to initiation of services.  Please see initial visit note for detailed documentation.   Patient agreed to services and consent obtained.   Assessment: Review of patient past medical history, allergies, medications, and health status, including review of relevant consultants reports was performed today as part of a comprehensive evaluation and provision of chronic care management and care coordination services.     SDOH (Social Determinants of Health) assessments and interventions performed:    Advanced Directives Status: Not addressed in this encounter.  CCM Care Plan  No Known Allergies  Outpatient Encounter Medications as of 09/30/2020  Medication Sig  . amLODipine (NORVASC) 5 MG tablet TAKE 1 TABLET(5 MG) BY MOUTH DAILY  . atorvastatin (LIPITOR) 20 MG tablet TAKE 1 TABLET BY MOUTH EVERY DAY  . escitalopram (LEXAPRO) 10 MG tablet TAKE 1 TABLET(10 MG) BY MOUTH DAILY  . lisinopril-hydrochlorothiazide (ZESTORETIC) 20-12.5 MG tablet TAKE 1 TABLET BY MOUTH DAILY  . Melatonin 2.5 MG CAPS One at bedtime po  . methylPREDNISolone (MEDROL DOSEPAK) 4 MG TBPK tablet 6 day dose pack - take as directed  . naproxen (NAPROSYN) 500 MG tablet Take 1 tablet by mouth every 12 (twelve) hours as needed.   . nystatin (NYSTATIN) powder Apply topically 4  (four) times daily.  . prednisoLONE acetate (PRED FORTE) 1 % ophthalmic suspension   . VITAMIN D PO Take 5,000 mg by mouth. Take 1 daily mon through fri   No facility-administered encounter medications on file as of 09/30/2020.    Patient Active Problem List   Diagnosis Date Noted  . Urinary frequency 10/31/2018  . Prediabetes 10/31/2018  . OSA on CPAP 07/22/2018  . CPAP use counseling 07/22/2018  . Abnormal dreams 07/22/2018  . Chronic obstructive pulmonary disease (HCC) 03/21/2018  . Other fatigue 03/21/2018  . Snorings 03/21/2018  . Nocturia more than twice per night 03/21/2018  . 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 Overweight (BMI 25.0-29.9) 11/01/2016  . 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 Murmur 08/01/2016  . Shortness of breath 08/01/2016  . Abnormal EKG 08/01/2016  . Essential hypertension 08/01/2016  . Depression 08/01/2016    Conditions to be addressed/monitored: HTN; Limited knowledge of health plan benefits  Care Plan : Hypertension (Adult)  Updates made by [name] since 09/30/2020 12:00 AM    Problem: Disease Progression (Hypertension)   Priority: Medium    Long-Range Goal: Disease Progression Prevented or Minimized   Start Date: 09/21/2020  Expected End Date: 12/20/2020  Recent Progress: On track  Priority: Medium  Note:   Objective:  . Last practice recorded BP readings:  BP Readings from Last 3 Encounters:  05/20/20 118/70  01/01/20 (!) 142/74  11/12/19 (!) 142/80 .   Marland Kitchen Most recent eGFR/CrCl: No results found for: EGFR  No components found for: CRCL Current Barriers:  Marland Kitchen Knowledge Deficits related to basic understanding of  hypertension pathophysiology and self care management . Knowledge Deficits related to understanding of medications prescribed for management of hypertension . Needs BP cuff for home use Case Manager Clinical Goal(s):  Marland Kitchen Over the next 90 days, patient will demonstrate improved health management independence as evidenced by checking  blood pressure as directed and notifying PCP if SBP>140 or DBP > 80, taking all medications as prescribe, and adhering to a low sodium diet as discussed. Interventions:  . Collaboration with Glendale Chard, MD regarding development and update of comprehensive plan of care as evidenced by provider attestation and co-signature . Inter-disciplinary care team collaboration (see longitudinal plan of care) . Evaluation of current treatment plan related to hypertension self management and patient's adherence to plan as established by provider. . Reviewed medications with patient and discussed importance of compliance . Educated on target BP <130/80; Determined patient is not self monitoring her BP at home due to having an older wrist cuff that does not work properly . Determined patient is not aware if she has an OTC benefit through [facility] . Discussed patient working with embedded BSW Vinicius Brockman to determined benefits for purchase of BP cuff and or to learn patient out of pocket for purchase of BP cuff  . Advised patient, providing education and rationale, to monitor blood pressure daily and record, calling PCP for findings outside established parameters  . Discussed plans with patient for ongoing care management follow up and provided patient with direct contact information for care management team  SW Interventions Completed 1.27.22 . 1:1 collaboration with Glendale Chard, MD regarding development and update of comprehensive plan of care as evidenced by provider attestation and co-signature . Inter-disciplinary care team collaboration (see longitudinal plan of care) . Successful outbound call placed to the patient to assist with verifying health plan benefits . Placed joint call to [facility] . Determined the patient does not have an OTC benefit . Informed the patients health plan would cover the cost of a BP Monitor if the patient were on dialysis . Discussed with the patient the opportunity  to request a prescription be sent to [place] to determine out of pocket costs for the patient - patient declined stating she would not drive that far . Patient reports she has a BP cuff in the home and will look for it; if patient can not locate she will purchase a new one from [facility] . Discussed the patient is not sure if her current BP cuff is accurate . Encouraged the patient to bring it with her to her next OV to determine accuracy compared to a manual cuff . Collaboration with RN Care Manager to inform of today's interventions and plan   Patient Goals/Self-Care Activities . Over the next 90 days, patient will:  choose a place to take my blood pressure (home, clinic or office, retail store) write blood pressure results in a log or diary  work with the embedded BSW to explore [facility] OTC benefits, if eligible, purchase a BP cuff via this benefit -completed 1.27.22 Self administers medications as prescribed Attends all scheduled provider appointments Calls provider office for new concerns, questions, or BP outside discussed parameters Checks BP and records as discussed Follows a low sodium diet/DASH diet  Follow Up Plan: Telephone follow up appointment with care management team member scheduled for: 11/02/20                 Follow Up Plan: Appointment scheduled for RN CM to follow up with the patient  on 11/02/20. No SW follow up planned at this time.      Daneen Schick, BSW, CDP Social Worker, Certified Dementia Practitioner [place] [facility] [place] [facility] [phone]  Total time spent performing care coordination and/or care management activities with the patient by phone or face to face = 21 minutes.

## 2020-10-05 ENCOUNTER — Ambulatory Visit (INDEPENDENT_AMBULATORY_CARE_PROVIDER_SITE_OTHER): Payer: Medicare Other | Admitting: Podiatry

## 2020-10-05 ENCOUNTER — Other Ambulatory Visit: Payer: Self-pay

## 2020-10-05 ENCOUNTER — Encounter: Payer: Self-pay | Admitting: Podiatry

## 2020-10-05 DIAGNOSIS — M79674 Pain in right toe(s): Secondary | ICD-10-CM

## 2020-10-05 DIAGNOSIS — M2011 Hallux valgus (acquired), right foot: Secondary | ICD-10-CM

## 2020-10-05 DIAGNOSIS — Z860100 Personal history of colon polyps, unspecified: Secondary | ICD-10-CM | POA: Insufficient documentation

## 2020-10-05 DIAGNOSIS — M2141 Flat foot [pes planus] (acquired), right foot: Secondary | ICD-10-CM

## 2020-10-05 DIAGNOSIS — Z800 Family history of malignant neoplasm of digestive organs: Secondary | ICD-10-CM | POA: Insufficient documentation

## 2020-10-05 DIAGNOSIS — K5730 Diverticulosis of large intestine without perforation or abscess without bleeding: Secondary | ICD-10-CM | POA: Insufficient documentation

## 2020-10-05 DIAGNOSIS — R748 Abnormal levels of other serum enzymes: Secondary | ICD-10-CM | POA: Insufficient documentation

## 2020-10-05 DIAGNOSIS — M79675 Pain in left toe(s): Secondary | ICD-10-CM

## 2020-10-05 DIAGNOSIS — M722 Plantar fascial fibromatosis: Secondary | ICD-10-CM

## 2020-10-05 DIAGNOSIS — B351 Tinea unguium: Secondary | ICD-10-CM | POA: Diagnosis not present

## 2020-10-05 DIAGNOSIS — M2142 Flat foot [pes planus] (acquired), left foot: Secondary | ICD-10-CM

## 2020-10-05 NOTE — Progress Notes (Signed)
Subjective: Katherine Summers is a 75 y.o. female patient seen today painful mycotic nails b/l that are difficult to trim. Pain interferes with ambulation. Aggravating factors include wearing enclosed shoe gear. Pain is relieved with periodic professional debridement.  Patient has seen Dr. Sherryle Lis for plantar fasciitis and states she did not receive call for physical therapy appointment.  She states she had car problems this morning and her son brought her to today's appointment.   She does not like the appearance of her bunion on the right foot. States it is not painful.  Patient Active Problem List   Diagnosis Date Noted  . Alkaline phosphatase raised 10/05/2020  . 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 Family history of malignant neoplasm of gastrointestinal tract 10/05/2020  . Personal history of colonic polyps 10/05/2020  . Impairment of balance 12/18/2019  . Muscle weakness 12/18/2019  . Urinary frequency 10/31/2018  . Prediabetes 10/31/2018  . OSA on CPAP 07/22/2018  . CPAP use counseling 07/22/2018  . Abnormal dreams 07/22/2018  . Chronic obstructive pulmonary disease ([place]) 03/21/2018  . Other fatigue 03/21/2018  . Snorings 03/21/2018  . Nocturia more than twice per night 03/21/2018  . 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 Pain in right knee 01/04/2018  . Overweight (BMI 25.0-29.9) 11/01/2016  . 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 Murmur 08/01/2016  . Shortness of breath 08/01/2016  . Abnormal EKG 08/01/2016  . Essential hypertension 08/01/2016  . Depression 08/01/2016    Current Outpatient Medications on File Prior to Visit  Medication Sig Dispense Refill  . Na Sulfate-K Sulfate-Mg Sulf (SUPREP BOWEL PREP KIT) 17.5-3.13-1.6 GM/177ML SOLN See admin instructions.    . Polyethylene Glycol 3350 (PEG 3350 PO) See admin instructions.    Marland Kitchen amLODipine (NORVASC) 5 MG tablet TAKE 1 TABLET(5 MG) BY MOUTH DAILY 90 tablet 2  . AMLODIPINE BENZOATE PO     . atorvastatin (LIPITOR) 20  MG tablet TAKE 1 TABLET BY MOUTH EVERY DAY 90 tablet 2  . ATORVASTATIN CALCIUM PO     . cromolyn (OPTICROM) 4 % ophthalmic solution SMARTSIG:In Eye(s)    . escitalopram (LEXAPRO) 10 MG tablet TAKE 1 TABLET(10 MG) BY MOUTH DAILY 90 tablet 1  . ESCITALOPRAM OXALATE PO     . LISINOPRIL PO     . lisinopril-hydrochlorothiazide (ZESTORETIC) 20-12.5 MG tablet TAKE 1 TABLET BY MOUTH DAILY 90 tablet 2  . Melatonin 2.5 MG CAPS One at bedtime po 30 capsule 0  . MELATONIN PO     . methylPREDNISolone (MEDROL DOSEPAK) 4 MG TBPK tablet 6 day dose pack - take as directed 21 tablet 0  . naproxen (NAPROSYN) 500 MG tablet Take 1 tablet by mouth every 12 (twelve) hours as needed.     . neomycin-polymyxin b-dexamethasone (MAXITROL) 3.5-10000-0.1 OINT SMARTSIG:1 Inch(es) In Eye(s) Every Night    . nystatin (NYSTATIN) powder Apply topically 4 (four) times daily. 15 g 0  . OXYBUTYNIN CHLORIDE PO     . prednisoLONE acetate (PRED FORTE) 1 % ophthalmic suspension     . VITAMIN D PO Take 5,000 mg by mouth. Take 1 daily mon through fri    . VITAMIN D, ERGOCALCIFEROL, PO      No current facility-administered medications on file prior to visit.    No Known Allergies  Objective: Physical Exam  General: Katherine Summers is a pleasant 75 y.o.  African American female, in NAD. AAO x 3.   Vascular:  Neurovascular status unchanged b/l. Capillary fill time to digits <3  seconds b/l. Palpable DP pulses b/l. Palpable PT pulses b/l. Pedal hair sparse b/l. Skin temperature gradient within normal limits b/l.  Dermatological:  Pedal skin is thin shiny, atrophic bilaterally. No open wounds bilaterally. No interdigital macerations bilaterally. Toenails 1-5 b/l elongated, dystrophic, thickened, crumbly with subungual debris and tenderness to dorsal palpation.  Musculoskeletal:  Normal muscle strength 5/5 to all lower extremity muscle groups bilaterally. Muscle strength 5/5 to all LE muscle groups of b/l lower extremities. No pain  crepitus or joint limitation noted with ROM b/l. Hallux valgus with bunion deformity noted right lower extremity. Pes planus deformity noted b/l.  Utilizes cane for ambulation assistance.   Neurological:  Protective sensation intact 5/5 intact bilaterally with 10g monofilament b/l. Vibratory sensation intact b/l.  Assessment and Plan:  1. Pain due to onychomycosis of toenails of both feet   2. Plantar fasciitis of left foot   3. Acquired hallux valgus of right foot   4. Pes planus of both feet    -Examined patient. -Discussed physical therapy referral with Dr. Sherryle Lis. He will resend referral to [facility] physical therapy for bilateral plantar fasciitis. -Toenails 1-5 b/l were debrided in length and girth with sterile nail nippers and dremel without iatrogenic bleeding.  -Patient to continue soft, supportive shoe gear daily. -Patient to report any pedal injuries to medical professional immediately. -Patient/POA to call should there be question/concern in the interim.  Return in about 3 months (around 01/02/2021).  Marzetta Board, DPM

## 2020-10-13 ENCOUNTER — Telehealth: Payer: Self-pay

## 2020-10-13 NOTE — Chronic Care Management (AMB) (Signed)
Chronic Care Management Pharmacy Assistant   Name: Katherine Summers  MRN: [id number] DOB: 09-Sep-1945  Reason for Encounter: Medication Review/Initial Questions for Pharmacist visit on 10/19/20.  Patient Questions:  1.  Have you seen any other providers since your last visit? Yes, 10/05/20-Galaway, Stephani Police, DPM (OV). 09/30/20-Humble, Kendra (CCM). 09/21/20-Little, Claudette Stapler, RN (CCM). 07/20/20-McDonald, Adam R, DPM (OV). 06/28/20-Galaway, Stephani Police, DPM (OV). 06/08/20-McDonald, Stephan Minister, DPM (OV). 05/21/20-Humble, Tillie Rung (CCM). 05/20/20-Sanders, Bailey Mech, MD ([place]). 04/13/20-Galaway, Stephani Police, DPM (OV).    2.  Any changes in your medicines or health? Yes, 05/20/20-Oxybutynin Chloride 10 MG (disc).    PCP : Glendale Chard, MD  Allergies:  No Known Allergies  Medications: Outpatient Encounter Medications as of 10/13/2020  Medication Sig   amLODipine (NORVASC) 5 MG tablet TAKE 1 TABLET(5 MG) BY MOUTH DAILY   AMLODIPINE BENZOATE PO    atorvastatin (LIPITOR) 20 MG tablet TAKE 1 TABLET BY MOUTH EVERY DAY   ATORVASTATIN CALCIUM PO    cromolyn (OPTICROM) 4 % ophthalmic solution SMARTSIG:In Eye(s)   escitalopram (LEXAPRO) 10 MG tablet TAKE 1 TABLET(10 MG) BY MOUTH DAILY   ESCITALOPRAM OXALATE PO    LISINOPRIL PO    lisinopril-hydrochlorothiazide (ZESTORETIC) 20-12.5 MG tablet TAKE 1 TABLET BY MOUTH DAILY   Melatonin 2.5 MG CAPS One at bedtime po   MELATONIN PO    methylPREDNISolone (MEDROL DOSEPAK) 4 MG TBPK tablet 6 day dose pack - take as directed   Na Sulfate-K Sulfate-Mg Sulf (SUPREP BOWEL PREP KIT) 17.5-3.13-1.6 GM/177ML SOLN See admin instructions.   naproxen (NAPROSYN) 500 MG tablet Take 1 tablet by mouth every 12 (twelve) hours as needed.    neomycin-polymyxin b-dexamethasone (MAXITROL) 3.5-10000-0.1 OINT SMARTSIG:1 Inch(es) In Eye(s) Every Night   nystatin (NYSTATIN) powder Apply topically 4 (four) times daily.   OXYBUTYNIN CHLORIDE PO    Polyethylene Glycol 3350  (PEG 3350 PO) See admin instructions.   prednisoLONE acetate (PRED FORTE) 1 % ophthalmic suspension    VITAMIN D PO Take 5,000 mg by mouth. Take 1 daily mon through fri   VITAMIN D, ERGOCALCIFEROL, PO    No facility-administered encounter medications on file as of 10/13/2020.    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   Family history of malignant neoplasm of gastrointestinal tract 10/05/2020   Personal history of colonic polyps 10/05/2020   Impairment of balance 12/18/2019   Muscle weakness 12/18/2019   Urinary frequency 10/31/2018   Prediabetes 10/31/2018   OSA on CPAP 07/22/2018   CPAP use counseling 07/22/2018   Abnormal dreams 07/22/2018   Chronic obstructive pulmonary disease ([place]) 03/21/2018   Other fatigue 03/21/2018   Snorings 03/21/2018   Nocturia more than twice per night 03/21/2018   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 Yes  Any changes in your medications or health? Yes, 05/20/20-Oxybutynin Chloride 10 MG (disc). Patient reports she stop taking Spiriva due to cost barrier, applied for patient assistance but was denied because of ineligibility. Patient states the inhaler was helping her breathe better.  Any side effects from any medications? None  Do you have an symptoms  or problems not managed by your medications? None.  Any concerns about your health right now? Patient reports she has concerns about her bladder. Patient is having frequent urination more than usual which keeps her up throughout the night. Patient states she may need  to see a urologist.  Has your provider asked that you check blood pressure, blood sugar, or follow special diet at home? Patient states provider wants her to lose weight. Patient was seeking assistance to obtain a blood pressure so she can be able to check at home. Patient stated she had a wrist cuff blood pressure device but provider advised her not to use that particular one.  Do you get any type of exercise on a regular basis? Patient reports she does light exercising at home with gym equipment and active when she is able to do so.  Can you think of a goal you would like to reach for your health? Patient states she want to lose 30 pounds.  Do you have any problems getting your medications? No  Is there anything that you would like to discuss during the appointment? No  Please bring medications and supplements to appointment-Patient made aware to have medications and supplements   Follow-Up:  Pharmacist Review   Orlando Penner, CPP Notified.  Raynelle Highland, [place] Pharmacist Assistant [phone] CCM Total Time:20mnutes

## 2020-10-19 ENCOUNTER — Ambulatory Visit (INDEPENDENT_AMBULATORY_CARE_PROVIDER_SITE_OTHER): Payer: Medicare Other

## 2020-10-19 DIAGNOSIS — I10 Essential (primary) hypertension: Secondary | ICD-10-CM

## 2020-10-19 DIAGNOSIS — E7800 Pure hypercholesterolemia, unspecified: Secondary | ICD-10-CM | POA: Diagnosis not present

## 2020-10-19 DIAGNOSIS — F3289 Other specified depressive episodes: Secondary | ICD-10-CM

## 2020-10-19 DIAGNOSIS — N3281 Overactive bladder: Secondary | ICD-10-CM

## 2020-10-19 NOTE — Progress Notes (Signed)
Chronic Care Management Pharmacy Note  10/23/2020 Name:  Katherine Summers MRN:  [id number] DOB:  Jan 09, 1946  Subjective: Katherine Summers is an 75 y.o. year old female who is a primary patient of Glendale Chard, MD.  The CCM team was consulted for assistance with disease management and care coordination needs.  Ms. Bryant reports that she is scared of the memory test because it is stressful. She is retired from [employer]. She wanted to say 30 years, but every 4 years there is a different boss.  She retired in 2008. She reports that she does not do anything in particular. She takes care of herself and does things early in the morning. She is concerned because people do not want to wear mask so she has to protect her health. She has strong faith in God and she knows that she can count on him. She is from [facility], [place] originally and she has 2 sons. She has been living in [place] for over 20 years. One son is 33 and the other son is 63. She has 3 grand children: one is 29, 12, and 46 years old. She attends church in [place], [place]. She does not sleep well because of her bladder.   Engaged with patient by telephone for initial visit in response to provider referral for pharmacy case management and/or care coordination services.   Consent to Services:  The patient was given the following information about Chronic Care Management services today, agreed to services, and gave verbal consent: 1. CCM service includes personalized support from designated clinical staff supervised by the primary care provider, including individualized plan of care and coordination with other care providers 2. 24/7 contact phone numbers for assistance for urgent and routine care needs. 3. Service will only be billed when office clinical staff spend 20 minutes or more in a month to coordinate care. 4. Only one practitioner may furnish and bill the service in a calendar month. 5.The patient may stop CCM services  at any time (effective at the end of the month) by phone call to the office staff. 6. The patient will be responsible for cost sharing (co-pay) of up to 20% of the service fee (after annual deductible is met). Patient agreed to services and consent obtained.  Patient Care Team: Glendale Chard, MD as PCP - General (Internal Medicine) Rex Kras Claudette Stapler, RN as Case Manager Mayford Knife, [facility] (Pharmacist)  Recent office visits: 05/20/2020  OV: She was given samples of Myrbetriq 43m to take once daily  Recent consult visits:  CCM RN visit:   Determined patient received a letter advising the cost of her Atorvastatin will increase to over $100 with next refill  Determined patient will not be able to afford to pay this amount and needs help with resources to help lower the cost  Discussed having patient work with the embedded Pharm D to offer assistance  Sent Pharm D referral marked routine priority with request to collaborate with patient regarding cost assistance with Atorvastatin  07/20/2020 DMP OV: Recommended Voltaren use.  I think she would benefit from physical therapy. [facility] Continue icing and stretching. Continue use of PF braces  06/08/2020 DPM OV: She has been advised not to take NSAIDs and I did not prescribe any today. Recommended Tylenol for pain control  -Recommend icing and stretching reviewed this with her and gave instructions for these.  -Recommend that she avoid track exercise for the next 2 weeks  and rest. Okay to continue upper body and core exercises seated the left heel was injected with 0.5cc 2% xylocaine plain, 0.5cc 0.5% marcaine plain, 9m triamcinolone acetonide, and 252mdexamethasone was injected along the plantar fascia at the insertion on the plantar calcaneus. 04/13/2020 DPM OV: Protective sensation intact 5/5 intact bilaterally with 10g monofilament b/l. Vibratory sensation intact b/l.   Hospital visits: None in previous 6  months  Objective:  Lab Results  Component Value Date   CREATININE 0.85 05/20/2020   BUN 12 05/20/2020   GFRNONAA 68 05/20/2020   GFRAA 79 05/20/2020   NA 147 (H) 05/20/2020   K 3.7 05/20/2020   CALCIUM 9.8 05/20/2020   CO2 26 05/20/2020    Lab Results  Component Value Date/Time   HGBA1C 6.3 (H) 05/20/2020 09:55 AM   HGBA1C 6.0 (H) 11/12/2019 10:31 AM   MICROALBUR 30 11/12/2019 12:34 PM    Last diabetic Eye exam:  Lab Results  Component Value Date/Time   HMDIABEYEEXA No Retinopathy 07/18/2018 12:00 AM    Last diabetic Foot exam: No results found for: HMDIABFOOTEX   Lab Results  Component Value Date   CHOL 138 11/12/2019   HDL 61 11/12/2019   LDLCALC 60 11/12/2019   TRIG 88 11/12/2019   CHOLHDL 2.3 11/12/2019    Hepatic Function Latest Ref Rng & Units 05/20/2020 11/12/2019 03/04/2019  Total Protein 6.0 - 8.5 g/dL 6.7 7.3 6.9  Albumin 3.7 - 4.7 g/dL 4.1 4.4 4.4  AST 0 - 40 IU/L _0 ALT 0 - 32 IU/L _1 Alk Phosphatase 44 - 121 IU/L 120 125(H) 113  Total Bilirubin 0.0 - 1.2 mg/dL 0.4 0.4 0.5    No results found for: TSH, FREET4  CBC Latest Ref Rng & Units 11/12/2019 10/31/2018 02/24/2018  WBC 3.4 - 10.8 x10E3/uL 10.7 10.4 8.2  Hemoglobin 11.1 - 15.9 g/dL 14.5 13.9 13.7  Hematocrit 34.0 - 46.6 % 43.4 39.9 39.8  Platelets 150 - 450 x10E3/uL 257 274 259    Lab Results  Component Value Date/Time   VD25OH 84.4 11/12/2019 10:31 AM    Clinical ASCVD: No  The 10-year ASCVD risk score (GMikey BussingC Jr., et al., 2013) is: 20.3%   Values used to calculate the score:     Age: [age]     Sex: Female     Is Non-Hispanic African American: Yes     Diabetic: Yes     Tobacco smoker: No     Systolic Blood Pressure: 11115mHg     Is BP treated: Yes     HDL Cholesterol: 61 mg/dL     Total Cholesterol: 138 mg/dL    Depression screen [facility] 10/19/2020 11/12/2019 03/18/2019  Decreased Interest 1 0 0  Down, Depressed, Hopeless 0 0 0  PHQ - 2 Score 1 0 0  Altered  sleeping - 3 -  Tired, decreased energy - 0 -  Change in appetite - 0 -  Feeling bad or failure about yourself  - 0 -  Trouble concentrating - 0 -  Moving slowly or fidgety/restless - 0 -  Suicidal thoughts - 0 -  PHQ-9 Score - 3 -  Difficult doing work/chores - Not difficult at all -       Social History   Tobacco Use  Smoking Status Former Smoker  . Packs/day: 0.25  . Years: 12.00  . Pack years: 3.00  . Types: Cigarettes  . Quit date: 1971. Years since quitting:  24.1  Smokeless Tobacco Never Used   BP Readings from Last 3 Encounters:  05/20/20 118/70  01/01/20 (!) 142/74  11/12/19 (!) 142/80   Pulse Readings from Last 3 Encounters:  05/20/20 77  01/01/20 91  11/12/19 91   Wt Readings from Last 3 Encounters:  05/20/20 190 lb 3.2 oz (86.3 kg)  01/01/20 192 lb (87.1 kg)  11/12/19 186 lb 9.6 oz (84.6 kg)    Assessment/Interventions: Review of patient past medical history, allergies, medications, health status, including review of consultants reports, laboratory and other test data, was performed as part of comprehensive evaluation and provision of chronic care management services.   SDOH:  (Social Determinants of Health) assessments and interventions performed: Yes  Social Determinants of Health with Concerns   Tobacco Use: Medium Risk  . Smoking Tobacco Use: Former Smoker  . Smokeless Tobacco Use: Never Used  Physical Activity: Inactive  . Days of Exercise per Week: 0 days  . Minutes of Exercise per Session: 0 min  Social Connections: Not on file  Intimate Partner Violence: Not on file  Alcohol Screen: Not on file  Housing: Not on file      CCM Care Plan  No Known Allergies  Medications Reviewed Today    Reviewed by Mayford Knife, [place] (Pharmacist) on 10/19/20 at 1250  Med List Status: <None>  Medication Order Taking? Sig Documenting Provider Last Dose Status Informant  amLODipine (NORVASC) 5 MG tablet [id number] Yes TAKE 1 TABLET(5 MG) BY MOUTH  DAILY Glendale Chard, MD Taking Active   atorvastatin (LIPITOR) 20 MG tablet [id number] Yes TAKE 1 TABLET BY MOUTH EVERY DAY Glendale Chard, MD Taking Active   cromolyn (OPTICROM) 4 % ophthalmic solution [id number] Yes SMARTSIG:In Eye(s) [provider] Taking Active   escitalopram (LEXAPRO) 10 MG tablet [id number] Yes TAKE 1 TABLET(10 MG) BY MOUTH DAILY Glendale Chard, MD Taking Active   lisinopril-hydrochlorothiazide (ZESTORETIC) 20-12.5 MG tablet [id number] Yes TAKE 1 TABLET BY MOUTH DAILY Glendale Chard, MD Taking Active   Melatonin 2.5 MG CAPS [id number] Yes One at bedtime po Dohmeier, Asencion Partridge, MD Taking Active   methylPREDNISolone (MEDROL DOSEPAK) 4 MG TBPK tablet [id number]  6 day dose pack - take as directed Criselda Peaches, DPM  Active   Na Sulfate-K Sulfate-Mg Sulf (SUPREP BOWEL PREP KIT) 17.5-3.13-1.6 GM/177ML SOLN [id number]  See admin instructions. [provider]  Active   naproxen (NAPROSYN) 500 MG tablet [id number]  Take 1 tablet by mouth every 12 (twelve) hours as needed.  [provider]  Active   neomycin-polymyxin b-dexamethasone (MAXITROL) 3.5-10000-0.1 OINT [id number]  SMARTSIG:1 Inch(es) In Eye(s) Every Night [provider]  Active   nystatin (NYSTATIN) powder [id number]  Apply topically 4 (four) times daily. Glendale Chard, MD  Active   OXYBUTYNIN CHLORIDE PO [id number]   [provider]  Active   Polyethylene Glycol 3350 (PEG 3350 PO) [id number]  See admin instructions. [provider]  Active   prednisoLONE acetate (PRED FORTE) 1 % ophthalmic suspension [id number]   [provider]  Active   VITAMIN D PO [id number]  Take 5,000 mg by mouth. Take 1 daily mon through fri [provider]  Active           Patient Active Problem List   Diagnosis Date Noted  . Alkaline phosphatase raised 10/05/2020  . 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 Family history of malignant neoplasm of gastrointestinal tract  10/05/2020  . Personal history of colonic polyps 10/05/2020  . Impairment of balance  12/18/2019  . Muscle weakness 12/18/2019  . Urinary frequency 10/31/2018  . Prediabetes 10/31/2018  . OSA on CPAP 07/22/2018  . CPAP use counseling 07/22/2018  . Abnormal dreams 07/22/2018  . Chronic obstructive pulmonary disease ([place]) 03/21/2018  . Other fatigue 03/21/2018  . Snorings 03/21/2018  . Nocturia more than twice per night 03/21/2018  . 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 Pain in right knee 01/04/2018  . Overweight (BMI 25.0-29.9) 11/01/2016  . 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 Murmur 08/01/2016  . Shortness of breath 08/01/2016  . Abnormal EKG 08/01/2016  . Essential hypertension 08/01/2016  . Depression 08/01/2016    Immunization History  Administered Date(s) Administered  . Moderna SARS-COV2 Booster Vaccination 07/05/2020  . Moderna Sars-Covid-2 Vaccination 10/18/2019, 11/15/2019  . PPD Test 12/05/2016  . Pneumococcal Conjugate-13 10/31/2018    Conditions to be addressed/monitored:  Hypertension, Hyperlipidemia, Diabetes, Depression and Overactive Bladder  Care Plan : [place]  Updates made by Mayford Knife, RPH since 10/23/2020 12:00 AM    Problem: HTN, HLD, OAB, DEPRESSION   Priority: High    Long-Range Goal: Disease State Management   Start Date: 10/19/2020  Expected End Date: 12/22/2020  This Visit's Progress: On track  Priority: High  Note:    Current Barriers:  . Unable to independently monitor therapeutic efficacy . Does not contact provider office for questions/concerns   Pharmacist Clinical Goal(s):  Marland Kitchen Over the next 90 days, patient will verbalize ability to afford treatment regimen . achieve adherence to monitoring guidelines and medication adherence to achieve therapeutic efficacy . adhere to prescribed medication regimen as evidenced by her BP log, and labs.  through collaboration with PharmD and provider.   Interventions: . 1:1  collaboration with Glendale Chard, MD regarding development and update of comprehensive plan of care as evidenced by provider attestation and co-signature . Inter-disciplinary care team collaboration (see longitudinal plan of care) . Comprehensive medication review performed; medication list updated in electronic medical record  Hypertension (BP goal <130/80) -controlled -Current treatment: . Amlodipine 5 mg tablet daily  . Lisinopril -Hydrochlorothiazide 20-12.5 mg tablet daily  -Current home readings: pt is not currently checking her BP at home, she lost her BP cuff but she was going to check to see if she can find her BP cuff.  -Current dietary habits: patient reports that she does not eat at the same time each day. Pt tries to limit the salt in her diet. When she is cooking she does not use salt.  -Current exercise habits: Silver sneakers on Tuesday and Thursday and then stopped going. She has bursitis and it is very painful so right now she is not working out as much.  -Denies hypotensive/hypertensive symptoms -Educated on Daily salt intake goal < 2300 mg; Exercise goal of 150 minutes per week; Importance of home blood pressure monitoring;  -Counseled to monitor BP at home 2-3 times per week , document, and provide log at future appointments -Counseled on diet and exercise extensively Recommended to continue current medication  Hyperlipidemia: (LDL goal < 100) -controlled -Current treatment: . Atorvastatin 20 mg tablet daily  o Pt reports that she has been taking medication at bedtime  -Her medication cost is going up to over $100 dollars on this medication per RN, Glenard Haring will follow up with insurance to determine alternative statin with tier 1 copay.  -Current dietary patterns: patient reports she eats okay. She tries to avoid unhealthy food. We will discuss in more detail at the next office  visit.  -Current exercise habits: she is not currently exercising, but she does attend  physical therapy twice per week.  -Educated on Benefits of statin for ASCVD risk reduction; Importance of limiting foods high in cholesterol; Exercise goal of 150 minutes per week; Recommended that patient start using her iPad to do silver sneakers at her home.  Patient reports that she has 60 tablets left.  -Counseled on diet and exercise extensively Recommended to continue current medication -Contacted patients insurance and reviewed current formulary list which had Atorvastatin as Tier 1 on the website. I contacted [facility] [phone] , and spoke with the patient advocacy team who transferred me to [phone], it was confirmed that patients medication is Tier 1 and will cost 6.53 - 30 day supply, or 18.83- 90 day supply.  -Contacted patient and we discussed the cost patient reports that she can afford the medication.    Depression(Goal:  remission and reduce emotional and financial burden of the disease) -controlled -Current treatment: . Escitalopram 10 mg daily   -Medications previously tried/failed: Escitalopram 5 mg -PHQ9:  -We discussed:  -Patient reports that she does not feel down  -Sometimes she takes her time to do things because she is retired  -Educated on Benefits of medication for symptom control Benefits of cognitive-behavioral therapy with or without medication -Recommended to continue current medication   Overactive Bladder -uncontrolled -Current treatment  . Myrbetriq 25 mg take daily (samples given) o Patient reports that she stopped taking the medication because it was not working  -We discussed the importance of following up with Dr. Baird Cancer if she has any questions .  -Medications previously tried: Oxybutynin 5 mg and 10 mg -Counseled on diet and exercise extensively Recommended to continue current medication Recommended patient discuss with Dr. Baird Cancer.   Health Maintenance -Vaccine gaps: Prevnar 79   -Educated on the importance of completing  vaccination.  -Patient is satisfied with current therapy and denies issues  -Counseled on diet and exercise extensively Recommended to continue current medication Counseled on the importance of taking medication at the same time each day.  Assessed patient finances. Contacted [employer] and confirmed patients tier status with her current medications. At this time all of her medications are Tier 1 with low copays.    Patient Goals/Self-Care Activities . Over the next 90 days, patient will:  - take medications as prescribed focus on medication adherence by taking medication at the same each day target a minimum of 150 minutes of moderate intensity exercise weekly  Follow Up Plan: Telephone follow up appointment with care management team member scheduled for: The patient has been provided with contact information for the care management team and has been advised to call with any health related questions or concerns.  The care management team will reach out to the patient again over the next 30 days.  Next AWV (Annual Wellness Visit) scheduled for: 11/17/2020      Medication Assistance: None required.  Patient affirms current coverage meets needs.  Patient's preferred pharmacy is:  [facility] [phone] - [place], [place] - [address] [place] [address] Phone: [phone] Fax: [phone]  Uses pill box? Yes Pt endorses 100% compliance  We discussed: Benefits of medication synchronization, packaging and delivery as well as enhanced pharmacist oversight with [employer]. Patient decided to: Continue current medication management strategy  Care Plan and Follow Up Patient Decision:  Patient agrees to Care Plan and Follow-up.  Plan: Telephone  follow up appointment with care management team member scheduled for:  11/18/2020 and The patient has been provided with contact information for the care management team and has been  advised to call with any health related questions or concerns.   Orlando Penner, PharmD Clinical Pharmacist [facility] [phone]

## 2020-10-23 NOTE — Patient Instructions (Addendum)
Goals Addressed            This Visit's Progress   . Manage My Medicine       Timeframe:  Long-Range Goal Priority:  High Start Date:     10/19/2020                        Expected End Date:    10/19/2021                   Follow Up Date: 11/17/2020   - call for medicine refill 2 or 3 days before it runs out - keep a list of all the medicines I take; vitamins and herbals too - use a pillbox to sort medicine    Why is this important?   . These steps will help you keep on track with your medicines.   Notes:  Follow up with any questions that I have about my medications       Patient Care Plan: Wellness (Adult)    Problem Identified: Medication Adherence (Wellness)   Priority: High    Long-Range Goal: Medication Adherence Maintained   Start Date: 09/21/2020  Expected End Date: 12/20/2020  This Visit's Progress: On track  Priority: High  Note:   Current Barriers:   Ineffective Self Health Maintenance  Unable to afford medication (Atorvastatin)  Currently UNABLE TO independently self manage needs related to chronic health conditions.   Knowledge Deficits related to short term plan for care coordination needs and long term plans for chronic disease management needs Case Manager Clinical Goal(s):  Marland Kitchen Collaboration with Glendale Chard, MD regarding development and update of comprehensive plan of care as evidenced by provider attestation and co-signature . Inter-disciplinary care team collaboration (see longitudinal plan of care)  Over the next 90 days, patient will work with care management team to address care coordination and chronic disease management needs related to Disease Management  Educational Needs  Care Coordination  Medication Management and Education  Medication Assistance   Psychosocial Support   Interventions:   Determined patient received a letter advising the cost of her Atorvastatin will increase to over $100 with next refill  Determined patient  will not be able to afford to pay this amount and needs help with resources to help lower the cost  Discussed having patient work with the embedded Pharm D to offer assistance  Sent Pharm D referral marked routine priority with request to collaborate with patient regarding cost assistance with Atorvastatin  Patient Goals/Self-Care Activities Over the next 90 days, patient will:  -work with the embedded Pharm D for assistance with cost of Atorvastatin -take all prescription medications with no missed doses -refill medications when supply is getting low prior to running out of medication   Follow Up Plan: Telephone follow up appointment with care management team member scheduled for: 11/02/20    Patient Care Plan: Diabetes Type 2 (Adult)    Problem Identified: Disease Progression (Diabetes, Type 2)   Priority: Medium    Long-Range Goal: Disease Progression Prevented or Minimized   Start Date: 09/21/2020  Expected End Date: 12/20/2020  This Visit's Progress: On track  Priority: Medium  Note:   Objective:  Lab Results  Component Value Date   HGBA1C 6.3 (H) 05/20/2020 .   Lab Results  Component Value Date   CREATININE 0.85 05/20/2020   CREATININE 0.87 11/12/2019   CREATININE 0.87 03/04/2019 .   Marland Kitchen No results found for: EGFR Current Barriers:  .  Knowledge Deficits related to basic Diabetes pathophysiology and self care/management . Knowledge Deficits related to medications used for management of diabetes . Does not have glucometer to monitor blood sugar Case Manager Clinical Goal(s):  Marland Kitchen Collaboration with Glendale Chard, MD regarding development and update of comprehensive plan of care as evidenced by provider attestation and co-signature . Inter-disciplinary care team collaboration (see longitudinal plan of care) . Over the next 90 days, patient will demonstrate improved adherence to prescribed treatment plan for diabetes self care/management as evidenced by:  . adherence to ADA/  carb modified diet . exercise 3-5 days/week Interventions:  . Provided education to patient about basic DM disease process . Discussed plans with patient for ongoing care management follow up and provided patient with direct contact information for care management team . Provided patient with written educational materials related to hypo and hyperglycemia and importance of correct treatment, Prediabetes Patient Goals/Self-Care Activities . Over the next 90 days, patient will:  Self administers oral medications as prescribed Attends all scheduled provider appointments Adheres to prescribed ADA/carb modified Review and discuss printed DM educational materials at next RN CM f/u call exercise 3-5 days/week   Follow Up Plan: Telephone follow up appointment with care management team member scheduled for: 11/02/20   Patient Care Plan: Hypertension (Adult)    Problem Identified: Disease Progression (Hypertension)   Priority: Medium    Long-Range Goal: Disease Progression Prevented or Minimized   Start Date: 09/21/2020  Expected End Date: 12/20/2020  Recent Progress: On track  Priority: Medium  Note:   Objective:  . Last practice recorded BP readings:  BP Readings from Last 3 Encounters:  05/20/20 118/70  01/01/20 (!) 142/74  11/12/19 (!) 142/80 .   Marland Kitchen Most recent eGFR/CrCl: No results found for: EGFR  No components found for: CRCL Current Barriers:  Marland Kitchen Knowledge Deficits related to basic understanding of hypertension pathophysiology and self care management . Knowledge Deficits related to understanding of medications prescribed for management of hypertension . Needs BP cuff for home use Case Manager Clinical Goal(s):  Marland Kitchen Over the next 90 days, patient will demonstrate improved health management independence as evidenced by checking blood pressure as directed and notifying PCP if SBP>140 or DBP > 80, taking all medications as prescribe, and adhering to a low sodium diet as  discussed. Interventions:  . Collaboration with Glendale Chard, MD regarding development and update of comprehensive plan of care as evidenced by provider attestation and co-signature . Inter-disciplinary care team collaboration (see longitudinal plan of care) . Evaluation of current treatment plan related to hypertension self management and patient's adherence to plan as established by provider. . Reviewed medications with patient and discussed importance of compliance . Educated on target BP <130/80; Determined patient is not self monitoring her BP at home due to having an older wrist cuff that does not work properly . Determined patient is not aware if she has an OTC benefit through [facility] . Discussed patient working with embedded BSW Kendra Humble to determined benefits for purchase of BP cuff and or to learn patient out of pocket for purchase of BP cuff  . Advised patient, providing education and rationale, to monitor blood pressure daily and record, calling PCP for findings outside established parameters  . Discussed plans with patient for ongoing care management follow up and provided patient with direct contact information for care management team  SW Interventions Completed 1.27.22 . 1:1 collaboration with Glendale Chard, MD regarding development and update of comprehensive plan of care as  evidenced by provider attestation and co-signature . Inter-disciplinary care team collaboration (see longitudinal plan of care) . Successful outbound call placed to the patient to assist with verifying health plan benefits . Placed joint call to [facility] . Determined the patient does not have an OTC benefit . Informed the patients health plan would cover the cost of a BP Monitor if the patient were on dialysis . Discussed with the patient the opportunity to request a prescription be sent to [place] to determine out of pocket costs for the patient - patient declined stating she would  not drive that far . Patient reports she has a BP cuff in the home and will look for it; if patient can not locate she will purchase a new one from [facility] . Discussed the patient is not sure if her current BP cuff is accurate . Encouraged the patient to bring it with her to her next OV to determine accuracy compared to a manual cuff . Collaboration with RN Care Manager to inform of today's interventions and plan   Patient Goals/Self-Care Activities . Over the next 90 days, patient will:  choose a place to take my blood pressure (home, clinic or office, retail store) write blood pressure results in a log or diary  work with the embedded BSW to explore [facility] OTC benefits, if eligible, purchase a BP cuff via this benefit -completed 1.27.22 Self administers medications as prescribed Attends all scheduled provider appointments Calls provider office for new concerns, questions, or BP outside discussed parameters Checks BP and records as discussed Follows a low sodium diet/DASH diet  Follow Up Plan: Telephone follow up appointment with care management team member scheduled for: 11/02/20              Patient Care Plan: Bladder dysfunction    Problem Identified: Bladder dysfunction   Priority: Medium    Long-Range Goal: Manage urinary frequency   Start Date: 09/21/2020  Expected End Date: 12/20/2020  This Visit's Progress: On track  Priority: Medium  Note:   Current Barriers:   Ineffective Self Health Maintenance  Currently UNABLE TO independently self manage needs related to chronic health conditions.   Knowledge Deficits related to short term plan for care coordination needs and long term plans for chronic disease management needs Case Manager Clinical Goal(s):  Marland Kitchen Collaboration with Glendale Chard, MD regarding development and update of comprehensive plan of care as evidenced by provider attestation and co-signature . Inter-disciplinary care team collaboration (see longitudinal  plan of care)  Over the next 90 days, patient will work with care management team to address care coordination and chronic disease management needs related to Disease Management  Educational Needs  Care Coordination  Medication Management and Education  Psychosocial Support   Interventions:   Determined patient continues to experience urinary frequency  Determined patient reports having ineffectiveness from the medications previously tried, prescribed by PCP  Discussed patient would like to be referred to a Specialist for evaluation and treatment of this condition  Sent in basket message to Dr. Baird Cancer requesting a Urology referral be sent for further evaluation of this condition  Determined PCP will further evaluate and send the appropriate referral  Notified patient of appointment with PCP Dr. Baird Cancer scheduled for 09/28/20 _0  pm, patient verbalizes understanding  Discussed plans with patient for ongoing care management follow up and provided patient with direct contact information for care management team Patient Goals/Self-Care Activities:  Over the next 90 days, patient will -f/u with Urology  Specialist for evaluation and treatment of over active bladder  -call PCP for new or worsening symptoms or concerns related to this condition -take prescription medications exactly as prescribed by MD   Follow Up Plan: Telephone follow up appointment with care management team member scheduled for: 11/02/20    Patient Care Plan: CCM Pharmacy Care Plan    Problem Identified: HTN, HLD, OAB, DEPRESSION   Priority: High    Long-Range Goal: Disease State Management   Start Date: 10/19/2020  Expected End Date: 12/22/2020  This Visit's Progress: On track  Priority: High  Note:    Current Barriers:  . Unable to independently monitor therapeutic efficacy . Does not contact provider office for questions/concerns   Pharmacist Clinical Goal(s):  Marland Kitchen Over the next 90 days, patient will  verbalize ability to afford treatment regimen . achieve adherence to monitoring guidelines and medication adherence to achieve therapeutic efficacy . adhere to prescribed medication regimen as evidenced by her BP log, and labs.  through collaboration with PharmD and provider.   Interventions: . 1:1 collaboration with Glendale Chard, MD regarding development and update of comprehensive plan of care as evidenced by provider attestation and co-signature . Inter-disciplinary care team collaboration (see longitudinal plan of care) . Comprehensive medication review performed; medication list updated in electronic medical record  Hypertension (BP goal <130/80) -controlled -Current treatment: . Amlodipine 5 mg tablet daily  . Lisinopril -Hydrochlorothiazide 20-12.5 mg tablet daily  -Current home readings: pt is not currently checking her BP at home, she lost her BP cuff but she was going to check to see if she can find her BP cuff.  -Current dietary habits: patient reports that she does not eat at the same time each day. Pt tries to limit the salt in her diet. When she is cooking she does not use salt.  -Current exercise habits: Silver sneakers on Tuesday and Thursday and then stopped going. She has bursitis and it is very painful so right now she is not working out as much.  -Denies hypotensive/hypertensive symptoms -Educated on Daily salt intake goal < 2300 mg; Exercise goal of 150 minutes per week; Importance of home blood pressure monitoring;  -Counseled to monitor BP at home 2-3 times per week , document, and provide log at future appointments -Counseled on diet and exercise extensively Recommended to continue current medication  Hyperlipidemia: (LDL goal < 100) -controlled -Current treatment: . Atorvastatin 20 mg tablet daily  o Pt reports that she has been taking medication at bedtime  -Her medication cost is going up to over $100 dollars on this medication per RN, Glenard Haring will follow up  with insurance to determine alternative statin with tier 1 copay.  -Current dietary patterns: patient reports she eats okay. She tries to avoid unhealthy food. We will discuss in more detail at the next office visit.  -Current exercise habits: she is not currently exercising, but she does attend physical therapy twice per week.  -Educated on Benefits of statin for ASCVD risk reduction; Importance of limiting foods high in cholesterol; Exercise goal of 150 minutes per week; Recommended that patient start using her iPad to do silver sneakers at her home.  Patient reports that she has 60 tablets left.  -Counseled on diet and exercise extensively Recommended to continue current medication -Contacted patients insurance and reviewed current formulary list which had Atorvastatin as Tier 1 on the website. I contacted [place] [phone] , and spoke with the patient advocacy team who transferred me to [phone], it  was confirmed that patients medication is Tier 1 and will cost 6.53 - 30 day supply, or 18.83- 90 day supply.  -Contacted patient and we discussed the cost patient reports that she can afford the medication.    Depression(Goal:  remission and reduce emotional and financial burden of the disease) -controlled -Current treatment: . Escitalopram 10 mg daily   -Medications previously tried/failed: Escitalopram 5 mg -PHQ9:  -We discussed:  -Patient reports that she does not feel down  -Sometimes she takes her time to do things because she is retired  -Educated on Benefits of medication for symptom control Benefits of cognitive-behavioral therapy with or without medication -Recommended to continue current medication   Overactive Bladder -uncontrolled -Current treatment  . Myrbetriq 25 mg take daily (samples given) o Patient reports that she stopped taking the medication because it was not working  -We discussed the importance of following up with Dr. Baird Cancer if she has any questions .   -Medications previously tried: Oxybutynin 5 mg and 10 mg -Counseled on diet and exercise extensively Recommended to continue current medication Recommended patient discuss with Dr. Baird Cancer.   Health Maintenance -Vaccine gaps: Prevnar 64   -Educated on the importance of completing vaccination.  -Patient is satisfied with current therapy and denies issues  -Counseled on diet and exercise extensively Recommended to continue current medication Counseled on the importance of taking medication at the same time each day.  Assessed patient finances. Contacted [employer] and confirmed patients tier status with her current medications. At this time all of her medications are Tier 1 with low copays.    Patient Goals/Self-Care Activities . Over the next 90 days, patient will:  - take medications as prescribed focus on medication adherence by taking medication at the same each day target a minimum of 150 minutes of moderate intensity exercise weekly  Follow Up Plan: Telephone follow up appointment with care management team member scheduled for: The patient has been provided with contact information for the care management team and has been advised to call with any health related questions or concerns.  The care management team will reach out to the patient again over the next 30 days.  Next AWV (Annual Wellness Visit) scheduled for: 11/17/2020   . Visit Information  Goals Addressed            This Visit's Progress   . Manage My Medicine       Timeframe:  Long-Range Goal Priority:  High Start Date:     10/19/2020                        Expected End Date:    10/19/2021                   Follow Up Date: 11/17/2020   - call for medicine refill 2 or 3 days before it runs out - keep a list of all the medicines I take; vitamins and herbals too - use a pillbox to sort medicine    Why is this important?   . These steps will help you keep on track with your medicines.   Notes:  Follow up with any  questions that I have about my medications       Patient Care Plan: Wellness (Adult)    Problem Identified: Medication Adherence (Wellness)   Priority: High    Long-Range Goal: Medication Adherence Maintained   Start Date: 09/21/2020  Expected End Date: 12/20/2020  This Visit's Progress: On  track  Priority: High  Note:   Current Barriers:   Ineffective Self Health Maintenance  Unable to afford medication (Atorvastatin)  Currently UNABLE TO independently self manage needs related to chronic health conditions.   Knowledge Deficits related to short term plan for care coordination needs and long term plans for chronic disease management needs Case Manager Clinical Goal(s):  Marland Kitchen Collaboration with Glendale Chard, MD regarding development and update of comprehensive plan of care as evidenced by provider attestation and co-signature . Inter-disciplinary care team collaboration (see longitudinal plan of care)  Over the next 90 days, patient will work with care management team to address care coordination and chronic disease management needs related to Disease Management  Educational Needs  Care Coordination  Medication Management and Education  Medication Assistance   Psychosocial Support   Interventions:   Determined patient received a letter advising the cost of her Atorvastatin will increase to over $100 with next refill  Determined patient will not be able to afford to pay this amount and needs help with resources to help lower the cost  Discussed having patient work with the embedded Pharm D to offer assistance  Sent Pharm D referral marked routine priority with request to collaborate with patient regarding cost assistance with Atorvastatin  Patient Goals/Self-Care Activities Over the next 90 days, patient will:  -work with the embedded Pharm D for assistance with cost of Atorvastatin -take all prescription medications with no missed doses -refill medications when supply  is getting low prior to running out of medication   Follow Up Plan: Telephone follow up appointment with care management team member scheduled for: 11/02/20    Patient Care Plan: Diabetes Type 2 (Adult)    Problem Identified: Disease Progression (Diabetes, Type 2)   Priority: Medium    Long-Range Goal: Disease Progression Prevented or Minimized   Start Date: 09/21/2020  Expected End Date: 12/20/2020  This Visit's Progress: On track  Priority: Medium  Note:   Objective:  Lab Results  Component Value Date   HGBA1C 6.3 (H) 05/20/2020 .   Lab Results  Component Value Date   CREATININE 0.85 05/20/2020   CREATININE 0.87 11/12/2019   CREATININE 0.87 03/04/2019 .   Marland Kitchen No results found for: EGFR Current Barriers:  Marland Kitchen Knowledge Deficits related to basic Diabetes pathophysiology and self care/management . Knowledge Deficits related to medications used for management of diabetes . Does not have glucometer to monitor blood sugar Case Manager Clinical Goal(s):  Marland Kitchen Collaboration with Glendale Chard, MD regarding development and update of comprehensive plan of care as evidenced by provider attestation and co-signature . Inter-disciplinary care team collaboration (see longitudinal plan of care) . Over the next 90 days, patient will demonstrate improved adherence to prescribed treatment plan for diabetes self care/management as evidenced by:  . adherence to ADA/ carb modified diet . exercise 3-5 days/week Interventions:  . Provided education to patient about basic DM disease process . Discussed plans with patient for ongoing care management follow up and provided patient with direct contact information for care management team . Provided patient with written educational materials related to hypo and hyperglycemia and importance of correct treatment, Prediabetes Patient Goals/Self-Care Activities . Over the next 90 days, patient will:  Self administers oral medications as prescribed Attends all  scheduled provider appointments Adheres to prescribed ADA/carb modified Review and discuss printed DM educational materials at next RN CM f/u call exercise 3-5 days/week   Follow Up Plan: Telephone follow up appointment with care management team member  scheduled for: 11/02/20   Patient Care Plan: Hypertension (Adult)    Problem Identified: Disease Progression (Hypertension)   Priority: Medium    Long-Range Goal: Disease Progression Prevented or Minimized   Start Date: 09/21/2020  Expected End Date: 12/20/2020  Recent Progress: On track  Priority: Medium  Note:   Objective:  . Last practice recorded BP readings:  BP Readings from Last 3 Encounters:  05/20/20 118/70  01/01/20 (!) 142/74  11/12/19 (!) 142/80 .   Marland Kitchen Most recent eGFR/CrCl: No results found for: EGFR  No components found for: CRCL Current Barriers:  Marland Kitchen Knowledge Deficits related to basic understanding of hypertension pathophysiology and self care management . Knowledge Deficits related to understanding of medications prescribed for management of hypertension . Needs BP cuff for home use Case Manager Clinical Goal(s):  Marland Kitchen Over the next 90 days, patient will demonstrate improved health management independence as evidenced by checking blood pressure as directed and notifying PCP if SBP>140 or DBP > 80, taking all medications as prescribe, and adhering to a low sodium diet as discussed. Interventions:  . Collaboration with Glendale Chard, MD regarding development and update of comprehensive plan of care as evidenced by provider attestation and co-signature . Inter-disciplinary care team collaboration (see longitudinal plan of care) . Evaluation of current treatment plan related to hypertension self management and patient's adherence to plan as established by provider. . Reviewed medications with patient and discussed importance of compliance . Educated on target BP <130/80; Determined patient is not self monitoring her BP at home  due to having an older wrist cuff that does not work properly . Determined patient is not aware if she has an OTC benefit through [facility] . Discussed patient working with embedded BSW Kendra Humble to determined benefits for purchase of BP cuff and or to learn patient out of pocket for purchase of BP cuff  . Advised patient, providing education and rationale, to monitor blood pressure daily and record, calling PCP for findings outside established parameters  . Discussed plans with patient for ongoing care management follow up and provided patient with direct contact information for care management team  SW Interventions Completed 1.27.22 . 1:1 collaboration with Glendale Chard, MD regarding development and update of comprehensive plan of care as evidenced by provider attestation and co-signature . Inter-disciplinary care team collaboration (see longitudinal plan of care) . Successful outbound call placed to the patient to assist with verifying health plan benefits . Placed joint call to [facility] . Determined the patient does not have an OTC benefit . Informed the patients health plan would cover the cost of a BP Monitor if the patient were on dialysis . Discussed with the patient the opportunity to request a prescription be sent to [place] to determine out of pocket costs for the patient - patient declined stating she would not drive that far . Patient reports she has a BP cuff in the home and will look for it; if patient can not locate she will purchase a new one from [facility] . Discussed the patient is not sure if her current BP cuff is accurate . Encouraged the patient to bring it with her to her next OV to determine accuracy compared to a manual cuff . Collaboration with RN Care Manager to inform of today's interventions and plan   Patient Goals/Self-Care Activities . Over the next 90 days, patient will:  choose a place to take my blood pressure (home, clinic or office, retail  store) write blood  pressure results in a log or diary  work with the embedded BSW to explore [facility] OTC benefits, if eligible, purchase a BP cuff via this benefit -completed 1.27.22 Self administers medications as prescribed Attends all scheduled provider appointments Calls provider office for new concerns, questions, or BP outside discussed parameters Checks BP and records as discussed Follows a low sodium diet/DASH diet  Follow Up Plan: Telephone follow up appointment with care management team member scheduled for: 11/02/20              Patient Care Plan: Bladder dysfunction    Problem Identified: Bladder dysfunction   Priority: Medium    Long-Range Goal: Manage urinary frequency   Start Date: 09/21/2020  Expected End Date: 12/20/2020  This Visit's Progress: On track  Priority: Medium  Note:   Current Barriers:   Ineffective Self Health Maintenance  Currently UNABLE TO independently self manage needs related to chronic health conditions.   Knowledge Deficits related to short term plan for care coordination needs and long term plans for chronic disease management needs Case Manager Clinical Goal(s):  Marland Kitchen Collaboration with Glendale Chard, MD regarding development and update of comprehensive plan of care as evidenced by provider attestation and co-signature . Inter-disciplinary care team collaboration (see longitudinal plan of care)  Over the next 90 days, patient will work with care management team to address care coordination and chronic disease management needs related to Disease Management  Educational Needs  Care Coordination  Medication Management and Education  Psychosocial Support   Interventions:   Determined patient continues to experience urinary frequency  Determined patient reports having ineffectiveness from the medications previously tried, prescribed by PCP  Discussed patient would like to be referred to a Specialist for evaluation and treatment of  this condition  Sent in basket message to Dr. Baird Cancer requesting a Urology referral be sent for further evaluation of this condition  Determined PCP will further evaluate and send the appropriate referral  Notified patient of appointment with PCP Dr. Baird Cancer scheduled for 09/28/20 _0  pm, patient verbalizes understanding  Discussed plans with patient for ongoing care management follow up and provided patient with direct contact information for care management team Patient Goals/Self-Care Activities:  Over the next 90 days, patient will -f/u with Urology Specialist for evaluation and treatment of over active bladder  -call PCP for new or worsening symptoms or concerns related to this condition -take prescription medications exactly as prescribed by MD   Follow Up Plan: Telephone follow up appointment with care management team member scheduled for: 11/02/20    Patient Care Plan: CCM Pharmacy Care Plan    Problem Identified: HTN, HLD, OAB, DEPRESSION   Priority: High    Long-Range Goal: Disease State Management   Start Date: 10/19/2020  Expected End Date: 12/22/2020  This Visit's Progress: On track  Priority: High  Note:    Current Barriers:  . Unable to independently monitor therapeutic efficacy . Does not contact provider office for questions/concerns   Pharmacist Clinical Goal(s):  Marland Kitchen Over the next 90 days, patient will verbalize ability to afford treatment regimen . achieve adherence to monitoring guidelines and medication adherence to achieve therapeutic efficacy . adhere to prescribed medication regimen as evidenced by her BP log, and labs.  through collaboration with PharmD and provider.   Interventions: . 1:1 collaboration with Glendale Chard, MD regarding development and update of comprehensive plan of care as evidenced by provider attestation and co-signature . Inter-disciplinary care team collaboration (see longitudinal plan of care) .  Comprehensive medication review  performed; medication list updated in electronic medical record  Hypertension (BP goal <130/80) -controlled -Current treatment: . Amlodipine 5 mg tablet daily  . Lisinopril -Hydrochlorothiazide 20-12.5 mg tablet daily  -Current home readings: pt is not currently checking her BP at home, she lost her BP cuff but she was going to check to see if she can find her BP cuff.  -Current dietary habits: patient reports that she does not eat at the same time each day. Pt tries to limit the salt in her diet. When she is cooking she does not use salt.  -Current exercise habits: Silver sneakers on Tuesday and Thursday and then stopped going. She has bursitis and it is very painful so right now she is not working out as much.  -Denies hypotensive/hypertensive symptoms -Educated on Daily salt intake goal < 2300 mg; Exercise goal of 150 minutes per week; Importance of home blood pressure monitoring;  -Counseled to monitor BP at home 2-3 times per week , document, and provide log at future appointments -Counseled on diet and exercise extensively Recommended to continue current medication  Hyperlipidemia: (LDL goal < 100) -controlled -Current treatment: . Atorvastatin 20 mg tablet daily  o Pt reports that she has been taking medication at bedtime  -Her medication cost is going up to over $100 dollars on this medication per RN, Glenard Haring will follow up with insurance to determine alternative statin with tier 1 copay.  -Current dietary patterns: patient reports she eats okay. She tries to avoid unhealthy food. We will discuss in more detail at the next office visit.  -Current exercise habits: she is not currently exercising, but she does attend physical therapy twice per week.  -Educated on Benefits of statin for ASCVD risk reduction; Importance of limiting foods high in cholesterol; Exercise goal of 150 minutes per week; Recommended that patient start using her iPad to do silver sneakers at her home.  Patient  reports that she has 60 tablets left.  -Counseled on diet and exercise extensively Recommended to continue current medication -Contacted patients insurance and reviewed current formulary list which had Atorvastatin as Tier 1 on the website. I contacted [place] [phone] , and spoke with the patient advocacy team who transferred me to [phone], it was confirmed that patients medication is Tier 1 and will cost 6.53 - 30 day supply, or 18.83- 90 day supply.  -Contacted patient and we discussed the cost patient reports that she can afford the medication.    Depression(Goal:  remission and reduce emotional and financial burden of the disease) -controlled -Current treatment: . Escitalopram 10 mg daily   -Medications previously tried/failed: Escitalopram 5 mg -PHQ9:  -We discussed:  -Patient reports that she does not feel down  -Sometimes she takes her time to do things because she is retired  -Educated on Benefits of medication for symptom control Benefits of cognitive-behavioral therapy with or without medication -Recommended to continue current medication   Overactive Bladder -uncontrolled -Current treatment  . Myrbetriq 25 mg take daily (samples given) o Patient reports that she stopped taking the medication because it was not working  -We discussed the importance of following up with Dr. Baird Cancer if she has any questions .  -Medications previously tried: Oxybutynin 5 mg and 10 mg -Counseled on diet and exercise extensively Recommended to continue current medication Recommended patient discuss with Dr. Baird Cancer.   Health Maintenance -Vaccine gaps: Prevnar 100   -Educated on the importance of completing vaccination.  -Patient is satisfied with  current therapy and denies issues  -Counseled on diet and exercise extensively Recommended to continue current medication Counseled on the importance of taking medication at the same time each day.  Assessed patient finances. Contacted  [employer] and confirmed patients tier status with her current medications. At this time all of her medications are Tier 1 with low copays.    Patient Goals/Self-Care Activities . Over the next 90 days, patient will:  - take medications as prescribed focus on medication adherence by taking medication at the same each day target a minimum of 150 minutes of moderate intensity exercise weekly  Follow Up Plan: Telephone follow up appointment with care management team member scheduled for: The patient has been provided with contact information for the care management team and has been advised to call with any health related questions or concerns.  The care management team will reach out to the patient again over the next 30 days.  Next AWV (Annual Wellness Visit) scheduled for: 11/17/2020     Ms. Redinger was given information about Chronic Care Management services today including:  1. CCM service includes personalized support from designated clinical staff supervised by her physician, including individualized plan of care and coordination with other care providers 2. 24/7 contact phone numbers for assistance for urgent and routine care needs. 3. Standard insurance, coinsurance, copays and deductibles apply for chronic care management only during months in which we provide at least 20 minutes of these services. Most insurances cover these services at 100%, however patients may be responsible for any copay, coinsurance and/or deductible if applicable. This service may help you avoid the need for more expensive face-to-face services. 4. Only one practitioner may furnish and bill the service in a calendar month. 5. The patient may stop CCM services at any time (effective at the end of the month) by phone call to the office staff.  Patient agreed to services and verbal consent obtained.   The patient verbalized understanding of instructions, educational materials, and care plan provided today and agreed to  receive a mailed copy of patient instructions, educational materials, and care plan.  The pharmacy team will reach out to the patient again over the next 30 days.   Mayford Knife, [facility]

## 2020-10-27 ENCOUNTER — Other Ambulatory Visit: Payer: Self-pay | Admitting: Internal Medicine

## 2020-11-02 ENCOUNTER — Telehealth: Payer: Self-pay

## 2020-11-02 ENCOUNTER — Telehealth: Payer: Medicare Other

## 2020-11-02 NOTE — Telephone Encounter (Signed)
  Chronic Care Management   Outreach Note  11/02/2020 Name: Katherine Summers MRN: [id number] DOB: March 17, 1946  Referred by: Dorothyann Peng, MD Reason for referral : Chronic Care Management (RN CM FU Call Attempt)   An unsuccessful telephone outreach was attempted today. The patient was referred to the case management team for assistance with care management and care coordination.   Follow Up Plan: A HIPAA compliant phone message was left for the patient providing contact information and requesting a return call. Telephone follow up appointment with care management team member scheduled for: 12/04/20  Delsa Sale, RN, BSN, CCM Care Management Coordinator [facility] Care Management/[facility]  Direct Phone: [phone]

## 2020-11-17 ENCOUNTER — Telehealth: Payer: Self-pay

## 2020-11-17 ENCOUNTER — Other Ambulatory Visit: Payer: Self-pay

## 2020-11-17 ENCOUNTER — Encounter: Payer: Self-pay | Admitting: Internal Medicine

## 2020-11-17 ENCOUNTER — Ambulatory Visit (INDEPENDENT_AMBULATORY_CARE_PROVIDER_SITE_OTHER): Payer: Medicare Other | Admitting: Internal Medicine

## 2020-11-17 VITALS — BP 124/86 | HR 66 | Temp 98.4°F | Ht 65.5 in | Wt 178.4 lb

## 2020-11-17 DIAGNOSIS — R634 Abnormal weight loss: Secondary | ICD-10-CM

## 2020-11-17 DIAGNOSIS — R0600 Dyspnea, unspecified: Secondary | ICD-10-CM

## 2020-11-17 DIAGNOSIS — Z0000 Encounter for general adult medical examination without abnormal findings: Secondary | ICD-10-CM

## 2020-11-17 DIAGNOSIS — Z79899 Other long term (current) drug therapy: Secondary | ICD-10-CM

## 2020-11-17 DIAGNOSIS — J449 Chronic obstructive pulmonary disease, unspecified: Secondary | ICD-10-CM

## 2020-11-17 DIAGNOSIS — R7303 Prediabetes: Secondary | ICD-10-CM | POA: Diagnosis not present

## 2020-11-17 DIAGNOSIS — N3281 Overactive bladder: Secondary | ICD-10-CM

## 2020-11-17 DIAGNOSIS — E7800 Pure hypercholesterolemia, unspecified: Secondary | ICD-10-CM | POA: Diagnosis not present

## 2020-11-17 DIAGNOSIS — E663 Overweight: Secondary | ICD-10-CM

## 2020-11-17 DIAGNOSIS — I10 Essential (primary) hypertension: Secondary | ICD-10-CM | POA: Diagnosis not present

## 2020-11-17 DIAGNOSIS — F341 Dysthymic disorder: Secondary | ICD-10-CM

## 2020-11-17 DIAGNOSIS — R0609 Other forms of dyspnea: Secondary | ICD-10-CM

## 2020-11-17 DIAGNOSIS — R0989 Other specified symptoms and signs involving the circulatory and respiratory systems: Secondary | ICD-10-CM

## 2020-11-17 LAB — POCT URINALYSIS DIPSTICK
Bilirubin, UA: NEGATIVE
Glucose, UA: NEGATIVE
Ketones, UA: NEGATIVE
Nitrite, UA: NEGATIVE
Protein, UA: NEGATIVE
Spec Grav, UA: 1.01 (ref 1.010–1.025)
Urobilinogen, UA: 0.2 E.U./dL
pH, UA: 6 (ref 5.0–8.0)

## 2020-11-17 LAB — POCT UA - MICROALBUMIN
Albumin/Creatinine Ratio, Urine, POC: <30
Creatinine, POC: 100 mg/dL
Microalbumin Ur, POC: 30 mg/L

## 2020-11-17 NOTE — Patient Instructions (Signed)
Health Maintenance After Age 74 After age 74, you are at a higher risk for certain long-term diseases and infections as well as injuries from falls. Falls are a major cause of broken bones and head injuries in people who are older than age 74. Getting regular preventive care can help to keep you healthy and well. Preventive care includes getting regular testing and making lifestyle changes as recommended by your health care provider. Talk with your health care provider about:  Which screenings and tests you should have. A screening is a test that checks for a disease when you have no symptoms.  A diet and exercise plan that is right for you. What should I know about screenings and tests to prevent falls? Screening and testing are the best ways to find a health problem early. Early diagnosis and treatment give you the best chance of managing medical conditions that are common after age 74. Certain conditions and lifestyle choices may make you more likely to have a fall. Your health care provider may recommend:  Regular vision checks. Poor vision and conditions such as cataracts can make you more likely to have a fall. If you wear glasses, make sure to get your prescription updated if your vision changes.  Medicine review. Work with your health care provider to regularly review all of the medicines you are taking, including over-the-counter medicines. Ask your health care provider about any side effects that may make you more likely to have a fall. Tell your health care provider if any medicines that you take make you feel dizzy or sleepy.  Osteoporosis screening. Osteoporosis is a condition that causes the bones to get weaker. This can make the bones weak and cause them to break more easily.  Blood pressure screening. Blood pressure changes and medicines to control blood pressure can make you feel dizzy.  Strength and balance checks. Your health care provider may recommend certain tests to check your  strength and balance while standing, walking, or changing positions.  Foot health exam. Foot pain and numbness, as well as not wearing proper footwear, can make you more likely to have a fall.  Depression screening. You may be more likely to have a fall if you have a fear of falling, feel emotionally low, or feel unable to do activities that you used to do.  Alcohol use screening. Using too much alcohol can affect your balance and may make you more likely to have a fall. What actions can I take to lower my risk of falls? General instructions  Talk with your health care provider about your risks for falling. Tell your health care provider if: ? You fall. Be sure to tell your health care provider about all falls, even ones that seem minor. ? You feel dizzy, sleepy, or off-balance.  Take over-the-counter and prescription medicines only as told by your health care provider. These include any supplements.  Eat a healthy diet and maintain a healthy weight. A healthy diet includes low-fat dairy products, low-fat (lean) meats, and fiber from whole grains, beans, and lots of fruits and vegetables. Home safety  Remove any tripping hazards, such as rugs, cords, and clutter.  Install safety equipment such as grab bars in bathrooms and safety rails on stairs.  Keep rooms and walkways well-lit. Activity  Follow a regular exercise program to stay fit. This will help you maintain your balance. Ask your health care provider what types of exercise are appropriate for you.  If you need a cane or walker,   use it as recommended by your health care provider.  Wear supportive shoes that have nonskid soles.   Lifestyle  Do not drink alcohol if your health care provider tells you not to drink.  If you drink alcohol, limit how much you have: ? 0-1 drink a day for women. ? 0-2 drinks a day for men.  Be aware of how much alcohol is in your drink. In the [place] one drink equals one typical bottle of beer (12  oz), one-half glass of wine (5 oz), or one shot of hard liquor (1 oz).  Do not use any products that contain nicotine or tobacco, such as cigarettes and e-cigarettes. If you need help quitting, ask your health care provider. Summary  Having a healthy lifestyle and getting preventive care can help to protect your health and wellness after age 74.  Screening and testing are the best way to find a health problem early and help you avoid having a fall. Early diagnosis and treatment give you the best chance for managing medical conditions that are more common for people who are older than age 74.  Falls are a major cause of broken bones and head injuries in people who are older than age 74. Take precautions to prevent a fall at home.  Work with your health care provider to learn what changes you can make to improve your health and wellness and to prevent falls. This information is not intended to replace advice given to you by your health care provider. Make sure you discuss any questions you have with your health care provider. Document Revised: 12/12/2018 Document Reviewed: 07/04/2017 Elsevier Patient Education  2021 [employer]  

## 2020-11-17 NOTE — Progress Notes (Signed)
11/17/20-Patient confirmed CCM Phone Visit appointment on Thursday, 11/18/20 at 8:30 AM with Cherylin Mylar, CPP. Patient was made aware to have medications and supplements near during phone visit. No questions or concerns was voiced from the patient on call.  Cherylin Mylar, CPP Notified.  Suezanne Cheshire, CMA Clinical Pharmacist Assistant [phone] CCM Total Time: 2 minutes

## 2020-11-17 NOTE — Progress Notes (Signed)
I,Katawbba Wiggins,acting as a Education administrator for Maximino Greenland, MD.,have documented all relevant documentation on the behalf of Maximino Greenland, MD,as directed by  Maximino Greenland, MD while in the presence of Maximino Greenland, MD.  This visit occurred during the SARS-CoV-2 public health emergency.  Safety protocols were in place, including screening questions prior to the visit, additional usage of staff PPE, and extensive cleaning of exam room while observing appropriate contact time as indicated for disinfecting solutions.  Subjective:     Patient ID: Katherine Summers , female    DOB: 06/02/1946 , 75 y.o.   MRN: [id number]   Chief Complaint  Patient presents with  . Annual Exam  . Hypertension    HPI  She is here today for a full physical examination. She is no longer followed by GYN. She reports compliance with meds. She denies headaches, chest pain and shortness of breath. She wants to know today if she has a terminal illness. She is not experiencing any fever, chills, night sweats, cough, or swelling. She states she is just concerned that there is something "wrong' with her.   Hypertension This is a chronic problem. The current episode started more than 1 year ago. The problem has been gradually improving since onset. The problem is controlled. Pertinent negatives include no blurred vision, chest pain, palpitations or shortness of breath. Risk factors for coronary artery disease include obesity, post-menopausal state, stress and sedentary lifestyle. The current treatment provides moderate improvement. Compliance problems include exercise.      Past Medical History:  Diagnosis Date  . Allergy   . CKD (chronic kidney disease) stage 2, GFR 60-89 ml/min   . COPD (chronic obstructive pulmonary disease) ([place])   . Depression 08/01/2016  . Dizziness and giddiness   . Essential hypertension 08/01/2016  . Hyperlipidemia   . Hypertension   . Murmur 08/01/2016  . Pre-diabetes   . Shortness of  breath 08/01/2016  . Vitamin D deficiency      Family History  Problem Relation Age of Onset  . Stroke Mother   . Colon cancer Father   . Colon cancer Sister   . Stroke Maternal Grandmother   . Colon cancer Paternal Grandfather      Current Outpatient Medications:  .  amLODipine (NORVASC) 5 MG tablet, TAKE 1 TABLET(5 MG) BY MOUTH DAILY, Disp: 90 tablet, Rfl: 2 .  atorvastatin (LIPITOR) 20 MG tablet, TAKE 1 TABLET BY MOUTH EVERY DAY, Disp: 90 tablet, Rfl: 2 .  cromolyn (OPTICROM) 4 % ophthalmic solution, Place 1 drop into both eyes 2 (two) times daily., Disp: , Rfl:  .  escitalopram (LEXAPRO) 10 MG tablet, TAKE 1 TABLET(10 MG) BY MOUTH DAILY, Disp: 90 tablet, Rfl: 1 .  lisinopril-hydrochlorothiazide (ZESTORETIC) 20-12.5 MG tablet, TAKE 1 TABLET BY MOUTH DAILY, Disp: 90 tablet, Rfl: 2 .  Melatonin 2.5 MG CAPS, One at bedtime po, Disp: 30 capsule, Rfl: 0 .  naproxen (NAPROSYN) 500 MG tablet, Take 1 tablet by mouth every 12 (twelve) hours as needed. , Disp: , Rfl:  .  neomycin-polymyxin b-dexamethasone (MAXITROL) 3.5-10000-0.1 OINT, SMARTSIG:1 Inch(es) In Eye(s) Every Night, Disp: , Rfl:  .  nystatin (NYSTATIN) powder, Apply topically 4 (four) times daily., Disp: 15 g, Rfl: 0 .  OXYBUTYNIN CHLORIDE PO, , Disp: , Rfl:  .  Polyethylene Glycol 3350 (PEG 3350 PO), See admin instructions., Disp: , Rfl:  .  prednisoLONE acetate (PRED FORTE) 1 % ophthalmic suspension, , Disp: , Rfl:  .  VITAMIN  D PO, Take 5,000 mg by mouth. Take 1 daily mon through fri, Disp: , Rfl:  .  mirabegron ER (MYRBETRIQ) 50 MG TB24 tablet, Take 50 mg by mouth daily., Disp: , Rfl:    No Known Allergies    The patient states she uses post menopausal status for birth control. Last LMP was No LMP recorded. Patient is postmenopausal.. Negative for Dysmenorrhea. Negative for: breast discharge, breast lump(s), breast pain and breast self exam. Associated symptoms include abnormal vaginal bleeding. Pertinent negatives include  abnormal bleeding (hematology), anxiety, decreased libido, depression, difficulty falling sleep, dyspareunia, history of infertility, nocturia, sexual dysfunction, sleep disturbances, urinary incontinence, urinary urgency, vaginal discharge and vaginal itching. Diet regular.The patient states her exercise level is  minimal.  . The patient's tobacco use is:  Social History   Tobacco Use  Smoking Status Former Smoker  . Packs/day: 0.25  . Years: 12.00  . Pack years: 3.00  . Types: Cigarettes  . Quit date: 55  . Years since quitting: 24.2  Smokeless Tobacco Never Used  . She has been exposed to passive smoke. The patient's alcohol use is:  Social History   Substance and Sexual Activity  Alcohol Use Not Currently  . Alcohol/week: 0.0 standard drinks    Review of Systems  Constitutional: Negative.   HENT: Negative.   Eyes: Negative.  Negative for blurred vision.  Respiratory: Negative.  Negative for shortness of breath.   Cardiovascular: Negative.  Negative for chest pain and palpitations.  Gastrointestinal: Negative.   Endocrine: Negative.   Genitourinary: Positive for frequency.  Musculoskeletal: Negative.   Skin: Negative.   Allergic/Immunologic: Negative.   Neurological: Negative.   Hematological: Negative.   Psychiatric/Behavioral: Negative.      Today's Vitals   11/17/20 0911  BP: 124/86  Pulse: 66  Temp: 98.4 F (36.9 C)  TempSrc: Oral  Weight: 178 lb 6.4 oz (80.9 kg)  Height: 5' 5.5" (1.664 m)  PainSc: 0-No pain   Body mass index is 29.24 kg/m.  Wt Readings from Last 3 Encounters:  11/17/20 178 lb 6.4 oz (80.9 kg)  05/20/20 190 lb 3.2 oz (86.3 kg)  01/01/20 192 lb (87.1 kg)   Objective:  Physical Exam Vitals reviewed.  Constitutional:      General: She is not in acute distress.    Appearance: Normal appearance. She is well-developed.  HENT:     Head: Normocephalic and atraumatic.     Right Ear: Hearing, tympanic membrane, ear canal and external ear  normal. There is no impacted cerumen.     Left Ear: Hearing, tympanic membrane, ear canal and external ear normal. There is no impacted cerumen.     Nose:     Comments: Masked     Mouth/Throat:     Comments: Masked  Eyes:     General: Lids are normal.     Extraocular Movements: Extraocular movements intact.     Conjunctiva/sclera: Conjunctivae normal.     Pupils: Pupils are equal, round, and reactive to light.     Funduscopic exam:    Right eye: No papilledema.        Left eye: No papilledema.  Neck:     Thyroid: No thyroid mass.     Vascular: No carotid bruit.  Cardiovascular:     Rate and Rhythm: Normal rate and regular rhythm.     Pulses: Normal pulses.     Heart sounds: Normal heart sounds. No murmur heard.   Pulmonary:     Effort: Pulmonary effort  is normal.     Breath sounds: Normal breath sounds.  Chest:  Breasts:     Tanner Score is 5.     Right: Normal.     Left: Normal.    Abdominal:     General: Bowel sounds are normal. There is no distension.     Palpations: Abdomen is soft.     Tenderness: There is no abdominal tenderness.  Musculoskeletal:        General: No swelling. Normal range of motion.     Cervical back: Full passive range of motion without pain, normal range of motion and neck supple.     Right lower leg: No edema.     Left lower leg: No edema.  Skin:    General: Skin is warm and dry.     Capillary Refill: Capillary refill takes less than 2 seconds.  Neurological:     General: No focal deficit present.     Mental Status: She is alert and oriented to person, place, and time.     Cranial Nerves: No cranial nerve deficit.     Sensory: No sensory deficit.  Psychiatric:        Mood and Affect: Mood normal.        Behavior: Behavior normal.        Thought Content: Thought content normal.        Judgment: Judgment normal.         Assessment And Plan:     1. Routine general medical examination at health care facility Comments: A full exam was  performed. Importance of monthly self breast exams was discussed with the patient.  PATIENT IS ADVISED TO GET 30-45 MINUTES REGULAR EXERCISE NO LESS THAN FOUR TO FIVE DAYS PER WEEK - BOTH WEIGHTBEARING EXERCISES AND AEROBIC ARE RECOMMENDED.  PATIENT IS ADVISED TO FOLLOW A HEALTHY DIET WITH AT LEAST SIX FRUITS/VEGGIES PER DAY, DECREASE INTAKE OF RED MEAT, AND TO INCREASE FISH INTAKE TO TWO DAYS PER WEEK.  MEATS/FISH SHOULD NOT BE FRIED, BAKED OR BROILED IS PREFERABLE.  IT IS ALSO IMPORTANT TO CUT BACK ON YOUR SUGAR INTAKE. PLEASE AVOID ANYTHING WITH ADDED SUGAR, CORN SYRUP OR OTHER SWEETENERS. IF YOU MUST USE A SWEETENER, YOU CAN TRY STEVIA. IT IS ALSO IMPORTANT TO AVOID ARTIFICIALLY SWEETENERS AND DIET BEVERAGES. LASTLY, I SUGGEST WEARING SPF 50 SUNSCREEN ON EXPOSED PARTS AND ESPECIALLY WHEN IN THE DIRECT SUNLIGHT FOR AN EXTENDED PERIOD OF TIME.  PLEASE AVOID FAST FOOD RESTAURANTS AND INCREASE YOUR WATER INTAKE.   2. Essential hypertension Comments: Chronic, well controlled. EKG performed NSR w/ LVH by voltage, diffuse ST depression,   nonspecific T-abnormality. Advised to follow low sodium diet. F/u 6 mos. - CBC - CMP14+EGFR - Lipid panel - EKG 12-Lead - POCT Urinalysis Dipstick (81002) - POCT UA - Microalbumin - TSH  3. Pure hypercholesterolemia Comments: I will check fasting lipid panel today. Advised to limit her intake of fried foods, comply with statin therapy and increase fiber supplementation.  - TSH  4. Prediabetes Comments: Her a1c has been elevated in the past. I will recheck this today. She is encouraged to limit her intake of sugary beverages.  - Hemoglobin A1c  5. Dyspnea on exertion Comments: She declined having SOB during my exam, but later admitted it to [facility] pharmacist. She has h/o remote tobacco use, admits to 12-pkyr h/o tobacco use. I will refer her for PFTs.   6. Unintentional weight loss Comments: She has lost 12 pounds in the past six months. She  admits she is now  eating one meal per day because she does not cook as much.  I will check thyroid fxn.   7. Dysthymia Comments: I will refer her to Psychology for therapy. She is in agreement with her treatment plan. I believe her sx are due to social isolation, not much interaction w/  - Ambulatory referral to Psychology  8. OAB (overactive bladder) Comments: She was given samples of Myrbetriq. She will take $RemoveB'25mg'UonOxhWh$  daily x 7 days, then increase to $RemoveBef'50mg'tBTBJxjRNG$  daily. She will f/u in six weeks.   9. Overweight (BMI 25.0-29.9) Comments: Her BMI is acceptable for her demographic. Encouraged to gradually increase her daily activity, starting w/ 63min/day, increasing to 30 min 5days/wk.  10. Drug therapy - Vitamin B12  Patient was given opportunity to ask questions. Patient verbalized understanding of the plan and was able to repeat key elements of the plan. All questions were answered to their satisfaction.   I, Maximino Greenland, MD, have reviewed all documentation for this visit. The documentation on 11/17/20 for the exam, diagnosis, procedures, and orders are all accurate and complete.  THE PATIENT IS ENCOURAGED TO PRACTICE SOCIAL DISTANCING DUE TO THE COVID-19 PANDEMIC.

## 2020-11-18 ENCOUNTER — Ambulatory Visit (INDEPENDENT_AMBULATORY_CARE_PROVIDER_SITE_OTHER): Payer: Medicare Other

## 2020-11-18 DIAGNOSIS — I10 Essential (primary) hypertension: Secondary | ICD-10-CM

## 2020-11-18 DIAGNOSIS — R7303 Prediabetes: Secondary | ICD-10-CM

## 2020-11-18 DIAGNOSIS — E7800 Pure hypercholesterolemia, unspecified: Secondary | ICD-10-CM

## 2020-11-18 LAB — LIPID PANEL
Chol/HDL Ratio: 2.3 ratio (ref 0.0–4.4)
Cholesterol, Total: 133 mg/dL (ref 100–199)
HDL: 59 mg/dL (ref >39)
LDL Chol Calc (NIH): 61 mg/dL (ref 0–99)
Triglycerides: 62 mg/dL (ref 0–149)
VLDL Cholesterol Cal: 13 mg/dL (ref 5–40)

## 2020-11-18 LAB — CBC
Hematocrit: 41.2 % (ref 34.0–46.6)
Hemoglobin: 14 g/dL (ref 11.1–15.9)
MCH: 28.8 pg (ref 26.6–33.0)
MCHC: 34 g/dL (ref 31.5–35.7)
MCV: 85 fL (ref 79–97)
Platelets: 282 10*3/uL (ref 150–450)
RBC: 4.86 x10E6/uL (ref 3.77–5.28)
RDW: 12.9 % (ref 11.7–15.4)
WBC: 10.8 10*3/uL (ref 3.4–10.8)

## 2020-11-18 LAB — TSH: TSH: 1.44 u[IU]/mL (ref 0.450–4.500)

## 2020-11-18 LAB — CMP14+EGFR
ALT: 14 IU/L (ref 0–32)
AST: 22 IU/L (ref 0–40)
Albumin/Globulin Ratio: 1.6 (ref 1.2–2.2)
Albumin: 4.4 g/dL (ref 3.7–4.7)
Alkaline Phosphatase: 128 IU/L — ABNORMAL HIGH (ref 44–121)
BUN/Creatinine Ratio: 22 (ref 12–28)
BUN: 17 mg/dL (ref 8–27)
Bilirubin Total: 0.4 mg/dL (ref 0.0–1.2)
CO2: 23 mmol/L (ref 20–29)
Calcium: 9.7 mg/dL (ref 8.7–10.3)
Chloride: 97 mmol/L (ref 96–106)
Creatinine, Ser: 0.76 mg/dL (ref 0.57–1.00)
Globulin, Total: 2.8 g/dL (ref 1.5–4.5)
Glucose: 106 mg/dL — ABNORMAL HIGH (ref 65–99)
Potassium: 3.4 mmol/L — ABNORMAL LOW (ref 3.5–5.2)
Sodium: 137 mmol/L (ref 134–144)
Total Protein: 7.2 g/dL (ref 6.0–8.5)
eGFR: 83 mL/min/{1.73_m2} (ref >59)

## 2020-11-18 LAB — HEMOGLOBIN A1C
Est. average glucose Bld gHb Est-mCnc: 120 mg/dL
Hgb A1c MFr Bld: 5.8 % — ABNORMAL HIGH (ref 4.8–5.6)

## 2020-11-18 LAB — VITAMIN B12: Vitamin B-12: 402 pg/mL (ref 232–1245)

## 2020-11-18 NOTE — Progress Notes (Signed)
Chronic Care Management Pharmacy Note  12/01/2020 Name:  Katherine Summers MRN:  [id number] DOB:  [birth year]  Subjective: Katherine Summers is an 75 y.o. year old female who is a primary patient of Glendale Chard, MD.  The CCM team was consulted for assistance with disease management and care coordination needs.  Patient reports that she is doing well. Patient reports that she has been feeling bad she has lost interest in things.   Engaged with patient by telephone for follow up visit in response to provider referral for pharmacy case management and/or care coordination services.   Consent to Services:  The patient was given information about Chronic Care Management services, agreed to services, and gave verbal consent prior to initiation of services.  Please see initial visit note for detailed documentation.   Patient Care Team: Glendale Chard, MD as PCP - General (Internal Medicine) Lynne Logan, RN as Case Manager Mayford Knife, [facility] (Pharmacist)  Recent office visits: 11/17/2020 PCP OV  Recent consult visits:  10/14/2020 Podiatry OV 10/05/2020 [facility] visits: None in previous 6 months  Objective:  Lab Results  Component Value Date   CREATININE 0.76 11/17/2020   BUN 17 11/17/2020   GFRNONAA 68 05/20/2020   GFRAA 79 05/20/2020   NA 137 11/17/2020   K 3.4 (L) 11/17/2020   CALCIUM 9.7 11/17/2020   CO2 23 11/17/2020   GLUCOSE 106 (H) 11/17/2020    Lab Results  Component Value Date/Time   HGBA1C 5.8 (H) 11/17/2020 10:24 AM   HGBA1C 6.3 (H) 05/20/2020 09:55 AM   MICROALBUR 30 11/17/2020 11:38 AM   MICROALBUR 30 11/12/2019 12:34 PM    Last diabetic Eye exam:  Lab Results  Component Value Date/Time   HMDIABEYEEXA No Retinopathy 07/18/2018 12:00 AM    Last diabetic Foot exam: No results found for: HMDIABFOOTEX   Lab Results  Component Value Date   CHOL 133 11/17/2020   HDL 59 11/17/2020   LDLCALC 61 11/17/2020   TRIG 62 11/17/2020   CHOLHDL 2.3  11/17/2020    Hepatic Function Latest Ref Rng & Units 11/17/2020 05/20/2020 11/12/2019  Total Protein 6.0 - 8.5 g/dL 7.2 6.7 7.3  Albumin 3.7 - 4.7 g/dL 4.4 4.1 4.4  AST 0 - 40 IU/L _0 ALT 0 - 32 IU/L _1 Alk Phosphatase 44 - 121 IU/L 128(H) 120 125(H)  Total Bilirubin 0.0 - 1.2 mg/dL 0.4 0.4 0.4    Lab Results  Component Value Date/Time   TSH 1.440 11/17/2020 10:24 AM    CBC Latest Ref Rng & Units 11/17/2020 11/12/2019 10/31/2018  WBC 3.4 - 10.8 x10E3/uL 10.8 10.7 10.4  Hemoglobin 11.1 - 15.9 g/dL 14.0 14.5 13.9  Hematocrit 34.0 - 46.6 % 41.2 43.4 39.9  Platelets 150 - 450 x10E3/uL 282 257 274    Lab Results  Component Value Date/Time   VD25OH 84.4 11/12/2019 10:31 AM    Clinical ASCVD: No  The 10-year ASCVD risk score Mikey Bussing DC Jr., et al., 2013) is: 21.1%   Values used to calculate the score:     Age: 42 years     Sex: Female     Is Non-Hispanic African American: Yes     Diabetic: Yes     Tobacco smoker: No     Systolic Blood Pressure: 388 mmHg     Is BP treated: Yes     HDL Cholesterol: 59 mg/dL     Total Cholesterol: 133 mg/dL  Depression screen [facility] 2/9 10/19/2020 11/12/2019 03/18/2019  Decreased Interest 1 0 0  Down, Depressed, Hopeless 0 0 0  PHQ - 2 Score 1 0 0  Altered sleeping - 3 -  Tired, decreased energy - 0 -  Change in appetite - 0 -  Feeling bad or failure about yourself  - 0 -  Trouble concentrating - 0 -  Moving slowly or fidgety/restless - 0 -  Suicidal thoughts - 0 -  PHQ-9 Score - 3 -  Difficult doing work/chores - Not difficult at all -      Social History   Tobacco Use  Smoking Status Former Smoker  . Packs/day: 0.25  . Years: 12.00  . Pack years: 3.00  . Types: Cigarettes  . Quit date: 64  . Years since quitting: 24.2  Smokeless Tobacco Never Used   BP Readings from Last 3 Encounters:  11/17/20 124/86  05/20/20 118/70  01/01/20 (!) 142/74   Pulse Readings from Last 3 Encounters:  11/17/20 66  05/20/20 77   01/01/20 91   Wt Readings from Last 3 Encounters:  11/17/20 178 lb 6.4 oz (80.9 kg)  05/20/20 190 lb 3.2 oz (86.3 kg)  01/01/20 192 lb (87.1 kg)   BMI Readings from Last 3 Encounters:  11/17/20 29.24 kg/m  05/20/20 31.17 kg/m  01/01/20 31.46 kg/m    Assessment/Interventions: Review of patient past medical history, allergies, medications, health status, including review of consultants reports, laboratory and other test data, was performed as part of comprehensive evaluation and provision of chronic care management services.   SDOH:  (Social Determinants of Health) assessments and interventions performed: No   CCM Care Plan  No Known Allergies  Medications Reviewed Today    Reviewed by Mayford Knife, RPH (Pharmacist) on 11/18/20 at 1053  Med List Status: <None>  Medication Order Taking? Sig Documenting Provider Last Dose Status Informant  amLODipine (NORVASC) 5 MG tablet [id number] Yes TAKE 1 TABLET(5 MG) BY MOUTH DAILY Glendale Chard, MD Taking Active   atorvastatin (LIPITOR) 20 MG tablet [id number] Yes TAKE 1 TABLET BY MOUTH EVERY DAY Glendale Chard, MD Taking Active   cromolyn (OPTICROM) 4 % ophthalmic solution [id number] Yes Place 1 drop into both eyes 2 (two) times daily. [provider] Taking Active   escitalopram (LEXAPRO) 10 MG tablet [id number] Yes TAKE 1 TABLET(10 MG) BY MOUTH DAILY Glendale Chard, MD Taking Active   lisinopril-hydrochlorothiazide (ZESTORETIC) 20-12.5 MG tablet [id number] Yes TAKE 1 TABLET BY MOUTH DAILY Glendale Chard, MD Taking Active   Melatonin 2.5 MG CAPS [id number] Yes One at bedtime po Dohmeier, Asencion Partridge, MD Taking Active   naproxen (NAPROSYN) 500 MG tablet [id number] Yes Take 1 tablet by mouth every 12 (twelve) hours as needed.  [provider] Taking Active   neomycin-polymyxin b-dexamethasone (MAXITROL) 3.5-10000-0.1 OINT [id number] Yes SMARTSIG:1 Inch(es) In Eye(s) Every Night [provider] Taking Active   nystatin  (NYSTATIN) powder [id number] Yes Apply topically 4 (four) times daily. Glendale Chard, MD Taking Active   OXYBUTYNIN CHLORIDE PO [id number] Yes  [provider] Taking Active   Polyethylene Glycol 3350 (PEG 3350 PO) [id number] Yes See admin instructions. [provider] Taking Active   prednisoLONE acetate (PRED FORTE) 1 % ophthalmic suspension [id number] Yes  [provider] Taking Active   VITAMIN D PO [id number] Yes Take 5,000 mg by mouth. Take 1 daily mon through fri [provider] Taking Active           Patient Active Problem List  Diagnosis Date Noted  . Alkaline phosphatase raised 10/05/2020  . 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 Family history of malignant neoplasm of gastrointestinal tract 10/05/2020  . Personal history of colonic polyps 10/05/2020  . Impairment of balance 12/18/2019  . Muscle weakness 12/18/2019  . Urinary frequency 10/31/2018  . Prediabetes 10/31/2018  . OSA on CPAP 07/22/2018  . CPAP use counseling 07/22/2018  . Abnormal dreams 07/22/2018  . Chronic obstructive pulmonary disease ([place]) 03/21/2018  . Other fatigue 03/21/2018  . Snorings 03/21/2018  . Nocturia more than twice per night 03/21/2018  . 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 Pain in right knee 01/04/2018  . Overweight (BMI 25.0-29.9) 11/01/2016  . 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 Murmur 08/01/2016  . Shortness of breath 08/01/2016  . Abnormal EKG 08/01/2016  . Essential hypertension 08/01/2016  . Depression 08/01/2016    Immunization History  Administered Date(s) Administered  . Moderna SARS-COV2 Booster Vaccination 07/05/2020  . Moderna Sars-Covid-2 Vaccination 10/18/2019, 11/15/2019  . PPD Test 12/05/2016  . Pneumococcal Conjugate-13 10/31/2018    Conditions to be addressed/monitored:  Hypertension, Hyperlipidemia, COPD and Pre-Diabetes   Care Plan : [place]  Updates made by Mayford Knife, RPH since 12/01/2020 12:00 AM     Problem: HTN, HLD, COPD   Priority: High    Long-Range Goal: Disease Management   This Visit's Progress: On track  Priority: High  Note:    Current Barriers:  . Unable to independently afford treatment regimen . Does not maintain contact with provider office . Does not contact provider office for questions/concerns   Pharmacist Clinical Goal(s):  Marland Kitchen Patient will achieve adherence to monitoring guidelines and medication adherence to achieve therapeutic efficacy through collaboration with PharmD and provider.    Interventions: . 1:1 collaboration with Glendale Chard, MD regarding development and update of comprehensive plan of care as evidenced by provider attestation and co-signature . Inter-disciplinary care team collaboration (see longitudinal plan of care) . Comprehensive medication review performed; medication list updated in electronic medical record  Hypertension (BP goal <130/80) -Controlled -Current treatment: . Amlodipine 5 mg - taking 1 tablet by mouth daily . Lisinopril - Hydrochlorothiazide 20-12.5 mg tablet daily -Current exercise habits: patient reports that she is not exercising -Denies hypotensive/hypertensive symptoms -Educated on BP goals and benefits of medications for prevention of heart attack, stroke and kidney damage; Daily salt intake goal < 2300 mg; -Counseled to monitor BP at home at least two times per week, document, and provide log at future appointments -Recommended to continue current medication  Hyperlipidemia: (LDL goal < 70) -Controlled -Current treatment: . Atorvastatin 20 mg tablet daily -Current dietary patterns: patient reports that she is not eating as much.  -Current exercise habits: patient reports that she has not been exercising  -Educated on Cholesterol goals;  Benefits of statin for ASCVD risk reduction; Importance of limiting foods high in cholesterol; Exercise goal of 150 minutes per week; -Counseled on diet and exercise  extensively Recommended to continue current medication  Pre- Diabetes (A1c goal <7%) -Controlled -Current medications: . None of the above  -Educated on Complications of diabetes including kidney damage, retinal damage, and cardiovascular disease; Benefits of weight loss; -Counseled to check feet daily and get yearly eye exams -Recommended to continue current medication  COPD (Goal: control symptoms and prevent exacerbations) -Uncontrolled -Current treatment  . Not currently taking anything  -patient reports that that she is having breathings issues because she can not exhale out of her nose -She  would like to get back on a regimen for her breathing -She is going to start using the pulmonary tool that she got from the hospital when she had knee replacement.  -Counseled on Proper inhaler technique; Benefits of consistent maintenance inhaler use -Counseled on speaking with Dr. Baird Cancer about her breathing concerns.    Patient Goals/Self-Care Activities . Patient will:  - take medications as prescribed collaborate with provider on medication access solutions  Follow Up Plan: Telephone follow up appointment with care management team member scheduled for: The patient has been provided with contact information for the care management team and has been advised to call with any health related questions or concerns.       Medication Assistance: None required.  Patient affirms current coverage meets needs.  Patient's preferred pharmacy is:  [facility] [phone] - [place], [place] - [address] [place] [address] Phone: [phone] Fax: [phone]  Uses pill box? Yes Pt endorses 90% compliance  We discussed: Benefits of medication synchronization, packaging and delivery as well as enhanced pharmacist oversight with [employer]. Patient decided to: Continue current medication management strategy  Care Plan  and Follow Up Patient Decision:  Patient agrees to Care Plan and Follow-up.  Plan: Telephone follow up appointment with care management team member scheduled for:  01/20/2021 and The patient has been provided with contact information for the care management team and has been advised to call with any health related questions or concerns.   Orlando Penner, PharmD Clinical Pharmacist [facility] [phone]

## 2020-11-26 ENCOUNTER — Telehealth: Payer: Self-pay | Admitting: Internal Medicine

## 2020-11-26 NOTE — Telephone Encounter (Signed)
Tried calling patient   No answer  Please let patient know her awv appointment on 12/16/20  Has been changed to 12/22/20 @ 12. Please mark on appointment desk pt is aware of change Thank you

## 2020-11-28 NOTE — Progress Notes (Incomplete)
I,Katawbba Wiggins,acting as a Neurosurgeon for Gwynneth Aliment, MD.,have documented all relevant documentation on the behalf of Gwynneth Aliment, MD,as directed by  Gwynneth Aliment, MD while in the presence of Gwynneth Aliment, MD.  This visit occurred during the SARS-CoV-2 public health emergency.  Safety protocols were in place, including screening questions prior to the visit, additional usage of staff PPE, and extensive cleaning of exam room while observing appropriate contact time as indicated for disinfecting solutions.  Subjective:     Patient ID: Katherine Summers , female    DOB: [birth year] , 75 y.o.   MRN: [id number]   Chief Complaint  Patient presents with  . Annual Exam    HPI  She is here today for a full physical examination. She is no longer followed by GYN. She reports compliance with meds. She denies headaches, chest pain and shortness of breath. She wants to know today if she has a terminal illness. She is not experiencing any fever, chills, night sweats or weight loss. She states she is just concerned that there is something "wrong' with her.   Hypertension This is a chronic problem. The current episode started more than 1 year ago. The problem has been gradually improving since onset. The problem is controlled. Pertinent negatives include no blurred vision, chest pain, palpitations or shortness of breath. Risk factors for coronary artery disease include obesity, post-menopausal state, stress and sedentary lifestyle. The current treatment provides moderate improvement. Compliance problems include exercise.      Past Medical History:  Diagnosis Date  . Allergy   . CKD (chronic kidney disease) stage 2, GFR 60-89 ml/min   . COPD (chronic obstructive pulmonary disease) (HCC)   . Depression 08/01/2016  . Dizziness and giddiness   . Essential hypertension 08/01/2016  . Hyperlipidemia   . Hypertension   . Murmur 08/01/2016  . Pre-diabetes   . Shortness of breath 08/01/2016  .  Vitamin D deficiency      Family History  Problem Relation Age of Onset  . Stroke Mother   . Colon cancer Father   . Colon cancer Sister   . Stroke Maternal Grandmother   . Colon cancer Paternal Grandfather      Current Outpatient Medications:  .  amLODipine (NORVASC) 5 MG tablet, TAKE 1 TABLET(5 MG) BY MOUTH DAILY, Disp: 90 tablet, Rfl: 2 .  atorvastatin (LIPITOR) 20 MG tablet, TAKE 1 TABLET BY MOUTH EVERY DAY, Disp: 90 tablet, Rfl: 2 .  cromolyn (OPTICROM) 4 % ophthalmic solution, Place 1 drop into both eyes 2 (two) times daily., Disp: , Rfl:  .  escitalopram (LEXAPRO) 10 MG tablet, TAKE 1 TABLET(10 MG) BY MOUTH DAILY, Disp: 90 tablet, Rfl: 1 .  lisinopril-hydrochlorothiazide (ZESTORETIC) 20-12.5 MG tablet, TAKE 1 TABLET BY MOUTH DAILY, Disp: 90 tablet, Rfl: 2 .  Melatonin 2.5 MG CAPS, One at bedtime po, Disp: 30 capsule, Rfl: 0 .  naproxen (NAPROSYN) 500 MG tablet, Take 1 tablet by mouth every 12 (twelve) hours as needed. , Disp: , Rfl:  .  neomycin-polymyxin b-dexamethasone (MAXITROL) 3.5-10000-0.1 OINT, SMARTSIG:1 Inch(es) In Eye(s) Every Night, Disp: , Rfl:  .  nystatin (NYSTATIN) powder, Apply topically 4 (four) times daily., Disp: 15 g, Rfl: 0 .  OXYBUTYNIN CHLORIDE PO, , Disp: , Rfl:  .  Polyethylene Glycol 3350 (PEG 3350 PO), See admin instructions., Disp: , Rfl:  .  prednisoLONE acetate (PRED FORTE) 1 % ophthalmic suspension, , Disp: , Rfl:  .  VITAMIN D PO, Take  5,000 mg by mouth. Take 1 daily mon through fri, Disp: , Rfl:    No Known Allergies    The patient states she uses post menopausal status for birth control. Last LMP was No LMP recorded. Patient is postmenopausal.. Negative for Dysmenorrhea. Negative for: breast discharge, breast lump(s), breast pain and breast self exam. Associated symptoms include abnormal vaginal bleeding. Pertinent negatives include abnormal bleeding (hematology), anxiety, decreased libido, depression, difficulty falling sleep, dyspareunia,  history of infertility, nocturia, sexual dysfunction, sleep disturbances, urinary incontinence, urinary urgency, vaginal discharge and vaginal itching. Diet regular.The patient states her exercise level is  minimal.  . The patient's tobacco use is:  Social History   Tobacco Use  Smoking Status Former Smoker  . Packs/day: 0.25  . Years: 12.00  . Pack years: 3.00  . Types: Cigarettes  . Quit date: 80  . Years since quitting: 24.2  Smokeless Tobacco Never Used  . She has been exposed to passive smoke. The patient's alcohol use is:  Social History   Substance and Sexual Activity  Alcohol Use Not Currently  . Alcohol/week: 0.0 standard drinks    Review of Systems  Constitutional: Negative.   HENT: Negative.   Eyes: Negative.  Negative for blurred vision.  Respiratory: Negative.  Negative for shortness of breath.   Cardiovascular: Negative.  Negative for chest pain and palpitations.  Gastrointestinal: Negative.   Endocrine: Negative.   Genitourinary: Positive for frequency.  Musculoskeletal: Negative.   Skin: Negative.   Allergic/Immunologic: Negative.   Neurological: Negative.   Hematological: Negative.   Psychiatric/Behavioral: Negative.      Today's Vitals   11/17/20 0911  BP: 124/86  Pulse: 66  Temp: 98.4 F (36.9 C)  TempSrc: Oral  Weight: 178 lb 6.4 oz (80.9 kg)  Height: 5' 5.5" (1.664 m)  PainSc: 0-No pain   Body mass index is 29.24 kg/m.  Wt Readings from Last 3 Encounters:  11/17/20 178 lb 6.4 oz (80.9 kg)  05/20/20 190 lb 3.2 oz (86.3 kg)  01/01/20 192 lb (87.1 kg)   Objective:  Physical Exam Constitutional:      General: She is not in acute distress.    Appearance: Normal appearance. She is well-developed.  HENT:     Head: Normocephalic and atraumatic.     Right Ear: Hearing, tympanic membrane, ear canal and external ear normal. There is no impacted cerumen.     Left Ear: Hearing, tympanic membrane, ear canal and external ear normal. There is no  impacted cerumen.     Nose:     Comments: Masked     Mouth/Throat:     Comments: Masked  Eyes:     General: Lids are normal.     Extraocular Movements: Extraocular movements intact.     Conjunctiva/sclera: Conjunctivae normal.     Pupils: Pupils are equal, round, and reactive to light.     Funduscopic exam:    Right eye: No papilledema.        Left eye: No papilledema.  Neck:     Thyroid: No thyroid mass.     Vascular: No carotid bruit.  Cardiovascular:     Rate and Rhythm: Normal rate and regular rhythm.     Pulses: Normal pulses.     Heart sounds: Normal heart sounds. No murmur heard.   Pulmonary:     Effort: Pulmonary effort is normal.     Breath sounds: Normal breath sounds.  Chest:  Breasts:     Tanner Score is 5.  Right: Normal.     Left: Normal.    Abdominal:     General: Bowel sounds are normal. There is no distension.     Palpations: Abdomen is soft.     Tenderness: There is no abdominal tenderness.  Musculoskeletal:        General: No swelling. Normal range of motion.     Cervical back: Full passive range of motion without pain, normal range of motion and neck supple.     Right lower leg: No edema.     Left lower leg: No edema.  Skin:    General: Skin is warm and dry.     Capillary Refill: Capillary refill takes less than 2 seconds.  Neurological:     General: No focal deficit present.     Mental Status: She is alert and oriented to person, place, and time.     Cranial Nerves: No cranial nerve deficit.     Sensory: No sensory deficit.  Psychiatric:        Mood and Affect: Mood normal.        Behavior: Behavior normal.        Thought Content: Thought content normal.        Judgment: Judgment normal.         Assessment And Plan:     1. Routine general medical examination at health care facility Comments: A full exam was performed. Importance of monthly self breast exams was discussed with the patient.  2. Essential hypertension Comments:  Chronic, well controlled. EKG performed NSR w/ .  - CBC - CMP14+EGFR - Lipid panel - EKG 12-Lead - POCT Urinalysis Dipstick (81002) - POCT UA - Microalbumin - TSH  3. Pure hypercholesterolemia - TSH  4. Prediabetes Comments: Her a1c has been elevated in the past. I will recheck this today. She is encouraged to limit her intake of sugary beverages.  - Hemoglobin A1c  5. Chronic obstructive pulmonary disease, unspecified COPD type ([place])  6. Unintentional weight loss Comments: She has lost 12 pounds in the past six months. She admits she is now eating one meal per day because she does not cook as much.   7. Decreased pedal pulses  8. 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 OAB (overactive bladder) Comments: She was given samples of her Myrbetriq.   10. Overweight (BMI 25.0-29.9)  11. Drug therapy - Vitamin B12  Patient was given opportunity to ask questions. Patient verbalized understanding of the plan and was able to repeat key elements of the plan. All questions were answered to their satisfaction.   I, Maximino Greenland, MD, have reviewed all documentation for this visit. The documentation on 11/17/20 for the exam, diagnosis, procedures, and orders are all accurate and complete.  THE PATIENT IS ENCOURAGED TO PRACTICE SOCIAL DISTANCING DUE TO THE COVID-19 PANDEMIC.

## 2020-11-29 ENCOUNTER — Telehealth: Payer: Self-pay

## 2020-11-29 NOTE — Telephone Encounter (Signed)
The pt said that the myrbetriq is helping a little bit.

## 2020-11-30 ENCOUNTER — Telehealth: Payer: Self-pay | Admitting: Internal Medicine

## 2020-11-30 NOTE — Telephone Encounter (Signed)
Left message asking patient to call office  Please let patient know her [facility] appointment on 12/16/20  Has been changed to 12/22/20 @ 12. Please mark on appointment desk pt is aware of change Thank you

## 2020-12-01 NOTE — Patient Instructions (Signed)
Visit Information It was great speaking with you today!  Please let me know if you have any questions about our visit.  Goals Addressed            This Visit's Progress   . Lifestyle Change-Hypertension       Timeframe:  Long-Range Goal Priority:  High Start Date:                             Expected End Date:                       Follow Up Date 01/20/2021    - agree to work together to make changes    Why is this important?    The changes that you are asked to make may be hard to do.   This is especially true when the changes are life-long.   Knowing why it is important to you is the first step.   Working on the change with your family or support person helps you not feel alone.   Reward yourself and family or support person when goals are met. This can be an activity you choose like bowling, hiking, biking, swimming or shooting hoops.            Patient Care Plan: Wellness (Adult)    Problem Identified: Medication Adherence (Wellness)   Priority: High    Long-Range Goal: Medication Adherence Maintained   Start Date: 09/21/2020  Expected End Date: 12/20/2020  This Visit's Progress: On track  Priority: High  Note:   Current Barriers:   Ineffective Self Health Maintenance  Unable to afford medication (Atorvastatin)  Currently UNABLE TO independently self manage needs related to chronic health conditions.   Knowledge Deficits related to short term plan for care coordination needs and long term plans for chronic disease management needs Case Manager Clinical Goal(s):  Marland Kitchen Collaboration with Glendale Chard, MD regarding development and update of comprehensive plan of care as evidenced by provider attestation and co-signature . Inter-disciplinary care team collaboration (see longitudinal plan of care)  Over the next 90 days, patient will work with care management team to address care coordination and chronic disease management needs related to Disease  Management  Educational Needs  Care Coordination  Medication Management and Education  Medication Assistance   Psychosocial Support   Interventions:   Determined patient received a letter advising the cost of her Atorvastatin will increase to over $100 with next refill  Determined patient will not be able to afford to pay this amount and needs help with resources to help lower the cost  Discussed having patient work with the embedded Pharm D to offer assistance  Sent Pharm D referral marked routine priority with request to collaborate with patient regarding cost assistance with Atorvastatin  Patient Goals/Self-Care Activities Over the next 90 days, patient will:  -work with the embedded Pharm D for assistance with cost of Atorvastatin -take all prescription medications with no missed doses -refill medications when supply is getting low prior to running out of medication   Follow Up Plan: Telephone follow up appointment with care management team member scheduled for: 11/02/20    Patient Care Plan: Diabetes Type 2 (Adult)    Problem Identified: Disease Progression (Diabetes, Type 2)   Priority: Medium    Long-Range Goal: Disease Progression Prevented or Minimized   Start Date: 09/21/2020  Expected End Date: 12/20/2020  This Visit's Progress: On track  Priority: Medium  Note:   Objective:  Lab Results  Component Value Date   HGBA1C 6.3 (H) 05/20/2020 .   Lab Results  Component Value Date   CREATININE 0.85 05/20/2020   CREATININE 0.87 11/12/2019   CREATININE 0.87 03/04/2019 .   Marland Kitchen No results found for: EGFR Current Barriers:  Marland Kitchen Knowledge Deficits related to basic Diabetes pathophysiology and self care/management . Knowledge Deficits related to medications used for management of diabetes . Does not have glucometer to monitor blood sugar Case Manager Clinical Goal(s):  Marland Kitchen Collaboration with Glendale Chard, MD regarding development and update of comprehensive plan of care  as evidenced by provider attestation and co-signature . Inter-disciplinary care team collaboration (see longitudinal plan of care) . Over the next 90 days, patient will demonstrate improved adherence to prescribed treatment plan for diabetes self care/management as evidenced by:  . adherence to ADA/ carb modified diet . exercise 3-5 days/week Interventions:  . Provided education to patient about basic DM disease process . Discussed plans with patient for ongoing care management follow up and provided patient with direct contact information for care management team . Provided patient with written educational materials related to hypo and hyperglycemia and importance of correct treatment, Prediabetes Patient Goals/Self-Care Activities . Over the next 90 days, patient will:  Self administers oral medications as prescribed Attends all scheduled provider appointments Adheres to prescribed ADA/carb modified Review and discuss printed DM educational materials at next RN CM f/u call exercise 3-5 days/week   Follow Up Plan: Telephone follow up appointment with care management team member scheduled for: 11/02/20   Patient Care Plan: Hypertension (Adult)    Problem Identified: Disease Progression (Hypertension)   Priority: Medium    Long-Range Goal: Disease Progression Prevented or Minimized   Start Date: 09/21/2020  Expected End Date: 12/20/2020  Recent Progress: On track  Priority: Medium  Note:   Objective:  . Last practice recorded BP readings:  BP Readings from Last 3 Encounters:  05/20/20 118/70  01/01/20 (!) 142/74  11/12/19 (!) 142/80 .   Marland Kitchen Most recent eGFR/CrCl: No results found for: EGFR  No components found for: CRCL Current Barriers:  Marland Kitchen Knowledge Deficits related to basic understanding of hypertension pathophysiology and self care management . Knowledge Deficits related to understanding of medications prescribed for management of hypertension . Needs BP cuff for home use Case  Manager Clinical Goal(s):  Marland Kitchen Over the next 90 days, patient will demonstrate improved health management independence as evidenced by checking blood pressure as directed and notifying PCP if SBP>140 or DBP > 80, taking all medications as prescribe, and adhering to a low sodium diet as discussed. Interventions:  . Collaboration with Glendale Chard, MD regarding development and update of comprehensive plan of care as evidenced by provider attestation and co-signature . Inter-disciplinary care team collaboration (see longitudinal plan of care) . Evaluation of current treatment plan related to hypertension self management and patient's adherence to plan as established by provider. . Reviewed medications with patient and discussed importance of compliance . Educated on target BP <130/80; Determined patient is not self monitoring her BP at home due to having an older wrist cuff that does not work properly . Determined patient is not aware if she has an OTC benefit through [facility] . Discussed patient working with embedded BSW Kendra Humble to determined benefits for purchase of BP cuff and or to learn patient out of pocket for purchase of BP cuff  . Advised patient, providing education and rationale, to monitor  blood pressure daily and record, calling PCP for findings outside established parameters  . Discussed plans with patient for ongoing care management follow up and provided patient with direct contact information for care management team  SW Interventions Completed 1.27.22 . 1:1 collaboration with Glendale Chard, MD regarding development and update of comprehensive plan of care as evidenced by provider attestation and co-signature . Inter-disciplinary care team collaboration (see longitudinal plan of care) . Successful outbound call placed to the patient to assist with verifying health plan benefits . Placed joint call to [facility] . Determined the patient does not have an OTC  benefit . Informed the patients health plan would cover the cost of a BP Monitor if the patient were on dialysis . Discussed with the patient the opportunity to request a prescription be sent to [place] to determine out of pocket costs for the patient - patient declined stating she would not drive that far . Patient reports she has a BP cuff in the home and will look for it; if patient can not locate she will purchase a new one from [facility] . Discussed the patient is not sure if her current BP cuff is accurate . Encouraged the patient to bring it with her to her next OV to determine accuracy compared to a manual cuff . Collaboration with RN Care Manager to inform of today's interventions and plan   Patient Goals/Self-Care Activities . Over the next 90 days, patient will:  choose a place to take my blood pressure (home, clinic or office, retail store) write blood pressure results in a log or diary  work with the embedded BSW to explore [facility] OTC benefits, if eligible, purchase a BP cuff via this benefit -completed 1.27.22 Self administers medications as prescribed Attends all scheduled provider appointments Calls provider office for new concerns, questions, or BP outside discussed parameters Checks BP and records as discussed Follows a low sodium diet/DASH diet  Follow Up Plan: Telephone follow up appointment with care management team member scheduled for: 11/02/20              Patient Care Plan: Bladder dysfunction    Problem Identified: Bladder dysfunction   Priority: Medium    Long-Range Goal: Manage urinary frequency   Start Date: 09/21/2020  Expected End Date: 12/20/2020  This Visit's Progress: On track  Priority: Medium  Note:   Current Barriers:   Ineffective Self Health Maintenance  Currently UNABLE TO independently self manage needs related to chronic health conditions.   Knowledge Deficits related to short term plan for care coordination needs and long  term plans for chronic disease management needs Case Manager Clinical Goal(s):  Marland Kitchen Collaboration with Glendale Chard, MD regarding development and update of comprehensive plan of care as evidenced by provider attestation and co-signature . Inter-disciplinary care team collaboration (see longitudinal plan of care)  Over the next 90 days, patient will work with care management team to address care coordination and chronic disease management needs related to Disease Management  Educational Needs  Care Coordination  Medication Management and Education  Psychosocial Support   Interventions:   Determined patient continues to experience urinary frequency  Determined patient reports having ineffectiveness from the medications previously tried, prescribed by PCP  Discussed patient would like to be referred to a Specialist for evaluation and treatment of this condition  Sent in basket message to Dr. Baird Cancer requesting a Urology referral be sent for further evaluation of this condition  Determined PCP will further evaluate and  send the appropriate referral  Notified patient of appointment with PCP Dr. Baird Cancer scheduled for 09/28/20 $[email or web address]'[id number]$  pm, patient verbalizes understanding  Discussed plans with patient for ongoing care management follow up and provided patient with direct contact information for care management team Patient Goals/Self-Care Activities:  Over the next 90 days, patient will -f/u with Urology Specialist for evaluation and treatment of over active bladder  -call PCP for new or worsening symptoms or concerns related to this condition -take prescription medications exactly as prescribed by MD   Follow Up Plan: Telephone follow up appointment with care management team member scheduled for: 11/02/20    Patient Care Plan: CCM Pharmacy Care Plan    Problem Identified: HTN, HLD, OAB, DEPRESSION   Priority: High    Long-Range Goal: Disease State Management   Start Date: 10/19/2020   Expected End Date: 12/22/2020  This Visit's Progress: On track  Priority: High  Note:    Current Barriers:  . Unable to independently monitor therapeutic efficacy . Does not contact provider office for questions/concerns   Pharmacist Clinical Goal(s):  Marland Kitchen Over the next 90 days, patient will verbalize ability to afford treatment regimen . achieve adherence to monitoring guidelines and medication adherence to achieve therapeutic efficacy . adhere to prescribed medication regimen as evidenced by her BP log, and labs.  through collaboration with PharmD and provider.   Interventions: . 1:1 collaboration with Glendale Chard, MD regarding development and update of comprehensive plan of care as evidenced by provider attestation and co-signature . Inter-disciplinary care team collaboration (see longitudinal plan of care) . Comprehensive medication review performed; medication list updated in electronic medical record  Hypertension (BP goal <130/80) -controlled -Current treatment: . Amlodipine 5 mg tablet daily  . Lisinopril -Hydrochlorothiazide 20-12.5 mg tablet daily  -Current home readings: pt is not currently checking her BP at home, she lost her BP cuff but she was going to check to see if she can find her BP cuff.  -Current dietary habits: patient reports that she does not eat at the same time each day. Pt tries to limit the salt in her diet. When she is cooking she does not use salt.  -Current exercise habits: Silver sneakers on Tuesday and Thursday and then stopped going. She has bursitis and it is very painful so right now she is not working out as much.  -Denies hypotensive/hypertensive symptoms -Educated on Daily salt intake goal < 2300 mg; Exercise goal of 150 minutes per week; Importance of home blood pressure monitoring;  -Counseled to monitor BP at home 2-3 times per week , document, and provide log at future appointments -Counseled on diet and exercise extensively Recommended  to continue current medication  Hyperlipidemia: (LDL goal < 100) -controlled -Current treatment: . Atorvastatin 20 mg tablet daily  o Pt reports that she has been taking medication at bedtime  -Her medication cost is going up to over $100 dollars on this medication per RN, Glenard Haring will follow up with insurance to determine alternative statin with tier 1 copay.  -Current dietary patterns: patient reports she eats okay. She tries to avoid unhealthy food. We will discuss in more detail at the next office visit.  -Current exercise habits: she is not currently exercising, but she does attend physical therapy twice per week.  -Educated on Benefits of statin for ASCVD risk reduction; Importance of limiting foods high in cholesterol; Exercise goal of 150 minutes per week; Recommended that patient start using her iPad to do silver sneakers at her home.  Patient reports that she has 60 tablets left.  -Counseled on diet and exercise extensively Recommended to continue current medication -Contacted patients insurance and reviewed current formulary list which had Atorvastatin as Tier 1 on the website. I contacted [place] [phone] , and spoke with the patient advocacy team who transferred me to [phone], it was confirmed that patients medication is Tier 1 and will cost 6.53 - 30 day supply, or 18.83- 90 day supply.  -Contacted patient and we discussed the cost patient reports that she can afford the medication.    Depression(Goal:  remission and reduce emotional and financial burden of the disease) -controlled -Current treatment: . Escitalopram 10 mg daily   -Medications previously tried/failed: Escitalopram 5 mg -PHQ9:  -We discussed:  -Patient reports that she does not feel down  -Sometimes she takes her time to do things because she is retired  -Educated on Benefits of medication for symptom control Benefits of cognitive-behavioral therapy with or without medication -Recommended to  continue current medication   Overactive Bladder -uncontrolled -Current treatment  . Myrbetriq 25 mg take daily (samples given) o Patient reports that she stopped taking the medication because it was not working  -We discussed the importance of following up with Dr. Baird Cancer if she has any questions .  -Medications previously tried: Oxybutynin 5 mg and 10 mg -Counseled on diet and exercise extensively Recommended to continue current medication Recommended patient discuss with Dr. Baird Cancer.   Health Maintenance -Vaccine gaps: Prevnar 48   -Educated on the importance of completing vaccination.  -Patient is satisfied with current therapy and denies issues  -Counseled on diet and exercise extensively Recommended to continue current medication Counseled on the importance of taking medication at the same time each day.  Assessed patient finances. Contacted [employer] and confirmed patients tier status with her current medications. At this time all of her medications are Tier 1 with low copays.    Patient Goals/Self-Care Activities . Over the next 90 days, patient will:  - take medications as prescribed focus on medication adherence by taking medication at the same each day target a minimum of 150 minutes of moderate intensity exercise weekly  Follow Up Plan: Telephone follow up appointment with care management team member scheduled for: The patient has been provided with contact information for the care management team and has been advised to call with any health related questions or concerns.  The care management team will reach out to the patient again over the next 30 days.  Next AWV (Annual Wellness Visit) scheduled for: 11/17/2020   Patient Care Plan: [place] Plan    Problem Identified: HTN, HLD, COPD   Priority: High    Long-Range Goal: Disease Management   This Visit's Progress: On track  Priority: High  Note:    Current Barriers:  . Unable to independently afford  treatment regimen . Does not maintain contact with provider office . Does not contact provider office for questions/concerns   Pharmacist Clinical Goal(s):  Marland Kitchen Patient will achieve adherence to monitoring guidelines and medication adherence to achieve therapeutic efficacy through collaboration with PharmD and provider.    Interventions: . 1:1 collaboration with Glendale Chard, MD regarding development and update of comprehensive plan of care as evidenced by provider attestation and co-signature . Inter-disciplinary care team collaboration (see longitudinal plan of care) . Comprehensive medication review performed; medication list updated in electronic medical record  Hypertension (BP goal <130/80) -Controlled -Current treatment: . Amlodipine 5 mg - taking 1 tablet  by mouth daily . Lisinopril - Hydrochlorothiazide 20-12.5 mg tablet daily -Current exercise habits: patient reports that she is not exercising -Denies hypotensive/hypertensive symptoms -Educated on BP goals and benefits of medications for prevention of heart attack, stroke and kidney damage; Daily salt intake goal < 2300 mg; -Counseled to monitor BP at home at least two times per week, document, and provide log at future appointments -Recommended to continue current medication  Hyperlipidemia: (LDL goal < 70) -Controlled -Current treatment: . Atorvastatin 20 mg tablet daily -Current dietary patterns: patient reports that she is not eating as much.  -Current exercise habits: patient reports that she has not been exercising  -Educated on Cholesterol goals;  Benefits of statin for ASCVD risk reduction; Importance of limiting foods high in cholesterol; Exercise goal of 150 minutes per week; -Counseled on diet and exercise extensively Recommended to continue current medication  Pre- Diabetes (A1c goal <7%) -Controlled -Current medications: . None of the above  -Educated on Complications of diabetes including kidney  damage, retinal damage, and cardiovascular disease; Benefits of weight loss; -Counseled to check feet daily and get yearly eye exams -Recommended to continue current medication  COPD (Goal: control symptoms and prevent exacerbations) -Uncontrolled -Current treatment  . Not currently taking anything  -patient reports that that she is having breathings issues because she can not exhale out of her nose -She would like to get back on a regimen for her breathing -She is going to start using the pulmonary tool that she got from the hospital when she had knee replacement.  -Counseled on Proper inhaler technique; Benefits of consistent maintenance inhaler use -Counseled on speaking with Dr. Baird Cancer about her breathing concerns.    Patient Goals/Self-Care Activities . Patient will:  - take medications as prescribed collaborate with provider on medication access solutions  Follow Up Plan: Telephone follow up appointment with care management team member scheduled for: The patient has been provided with contact information for the care management team and has been advised to call with any health related questions or concerns.       Patient agreed to services and verbal consent obtained.   The patient verbalized understanding of instructions, educational materials, and care plan provided today and agreed to receive a mailed copy of patient instructions, educational materials, and care plan.   Orlando Penner, PharmD Clinical Pharmacist [facility] [phone]

## 2020-12-03 DIAGNOSIS — N3281 Overactive bladder: Secondary | ICD-10-CM | POA: Insufficient documentation

## 2020-12-03 DIAGNOSIS — R634 Abnormal weight loss: Secondary | ICD-10-CM | POA: Insufficient documentation

## 2020-12-03 DIAGNOSIS — E7800 Pure hypercholesterolemia, unspecified: Secondary | ICD-10-CM | POA: Insufficient documentation

## 2020-12-06 NOTE — Telephone Encounter (Signed)
Pt aware.

## 2020-12-07 ENCOUNTER — Other Ambulatory Visit: Payer: Self-pay

## 2020-12-08 ENCOUNTER — Telehealth: Payer: Self-pay | Admitting: Internal Medicine

## 2020-12-08 NOTE — Telephone Encounter (Signed)
Left message asking patient to call office Please reschedule 12/22/20 awv.  Herbie Saxon has meeting that morning

## 2020-12-09 ENCOUNTER — Telehealth: Payer: Self-pay

## 2020-12-09 NOTE — Chronic Care Management (AMB) (Unsigned)
Chronic Care Management Pharmacy Assistant   Name: Katherine Summers  MRN: [id number] DOB: 28-Mar-1946   Reason for Encounter: Hypertension-Hyperlipidemia-COPD Adherence Call.    Recent office visits:  None  Recent consult visits:  None  Hospital visits:  None in previous 6 months  Medications: Outpatient Encounter Medications as of 12/09/2020  Medication Sig  . amLODipine (NORVASC) 5 MG tablet TAKE 1 TABLET(5 MG) BY MOUTH DAILY  . atorvastatin (LIPITOR) 20 MG tablet TAKE 1 TABLET BY MOUTH EVERY DAY  . cromolyn (OPTICROM) 4 % ophthalmic solution Place 1 drop into both eyes 2 (two) times daily.  Marland Kitchen escitalopram (LEXAPRO) 10 MG tablet TAKE 1 TABLET(10 MG) BY MOUTH DAILY  . lisinopril-hydrochlorothiazide (ZESTORETIC) 20-12.5 MG tablet TAKE 1 TABLET BY MOUTH DAILY  . Melatonin 2.5 MG CAPS One at bedtime po  . mirabegron ER (MYRBETRIQ) 50 MG TB24 tablet Take 50 mg by mouth daily.  . naproxen (NAPROSYN) 500 MG tablet Take 1 tablet by mouth every 12 (twelve) hours as needed.   . neomycin-polymyxin b-dexamethasone (MAXITROL) 3.5-10000-0.1 OINT SMARTSIG:1 Inch(es) In Eye(s) Every Night  . nystatin (NYSTATIN) powder Apply topically 4 (four) times daily.  . OXYBUTYNIN CHLORIDE PO   . Polyethylene Glycol 3350 (PEG 3350 PO) See admin instructions.  . prednisoLONE acetate (PRED FORTE) 1 % ophthalmic suspension   . VITAMIN D PO Take 5,000 mg by mouth. Take 1 daily mon through fri   No facility-administered encounter medications on file as of 12/09/2020.    Reviewed chart prior to disease state call. Spoke with patient regarding BP  Recent Office Vitals: BP Readings from Last 3 Encounters:  11/17/20 124/86  05/20/20 118/70  01/01/20 (!) 142/74   Pulse Readings from Last 3 Encounters:  11/17/20 66  05/20/20 77  01/01/20 91    Wt Readings from Last 3 Encounters:  11/17/20 178 lb 6.4 oz (80.9 kg)  05/20/20 190 lb 3.2 oz (86.3 kg)  01/01/20 192 lb (87.1 kg)     Kidney Function Lab  Results  Component Value Date/Time   CREATININE 0.76 11/17/2020 10:24 AM   CREATININE 0.85 05/20/2020 09:55 AM   GFRNONAA 68 05/20/2020 09:55 AM   GFRAA 79 05/20/2020 09:55 AM    BMP Latest Ref Rng & Units 11/17/2020 05/20/2020 11/12/2019  Glucose 65 - 99 mg/dL 025(K) 270(W) 80  BUN 8 - 27 mg/dL 17 12 13   Creatinine 0.57 - 1.00 mg/dL 2.37 6.28  BUN/Creat Ratio 12 - 28 22 14 15   Sodium 134 - 144 mmol/L 137 147(H) 145(H)  Potassium 3.5 - 5.2 mmol/L 3.4(L) 3.7 3.8  Chloride 96 - 106 mmol/L 97 106 102  CO2 20 - 29 mmol/L 23 26 27   Calcium 8.7 - 10.3 mg/dL 9.7 9.8 3.15    . Current antihypertensive regimen:   Amlodipine 5 mg - taking 1 tablet by mouth daily  Lisinopril - Hydrochlorothiazide 20-12.5 mg tablet daily  . How often are you checking your Blood Pressure? {CHL HP BP Monitoring [phone]}   . Current home BP readings: ***  . What recent interventions/DTPs have been made by any provider to improve Blood Pressure control since last CPP Visit: ***  . Any recent hospitalizations or ED visits since last visit with CPP? No   . What diet changes have been made to improve Blood Pressure Control?  o *** . What exercise is being done to improve your Blood Pressure Control?  o ***  Adherence Review: Is the patient currently on ACE/ARB medication? {  yes/no:20286} Does the patient have >5 day gap between last estimated fill dates? {yes/no:20286}   12/09/2020 Name: Katherine Summers MRN: [id number] DOB: October 25, 1945 Katherine Summers is a 75 y.o. year old female who is a primary care patient of Dorothyann Peng, MD.  Comprehensive medication review performed; Spoke to patient regarding cholesterol  Lipid Panel    Component Value Date/Time   CHOL 133 11/17/2020 1121   TRIG 62 11/17/2020 1121   HDL 59 11/17/2020 1121   LDLCALC 61 11/17/2020 1121    10-year ASCVD risk score: The 10-year ASCVD risk score Denman George DC Montez Hageman., et al., 2013) is: 21.1%   Values used to calculate the  score:     Age: 75 years     Sex: Female     Is Non-Hispanic African American: Yes     Diabetic: Yes     Tobacco smoker: No     Systolic Blood Pressure: 124 mmHg     Is BP treated: Yes     HDL Cholesterol: 59 mg/dL     Total Cholesterol: 133 mg/dL  . Current antihyperlipidemic regimen:   Atorvastatin 20 mg tablet daily  . Previous antihyperlipidemic medications tried: ***  . ASCVD risk enhancing conditions: {USHLDriskfactors:24821}   . What recent interventions/DTPs have been made by any provider to improve Cholesterol control since last CPP Visit: ***  . Any recent hospitalizations or ED visits since last visit with CPP? No   . What diet changes have been made to improve Cholesterol?  o *** . What exercise is being done to improve Cholesterol?  o ***  Adherence Review: Does the patient have >5 day gap between last estimated fill dates? {yes/no:20286}   . Current COPD regimen: Not currently taking anything   . No flowsheet data found.   . Any recent hospitalizations or ED visits since last visit with CPP? No   . Reports***denies COPD symptoms, including {COPD Symptoms:24140}   . What recent interventions/DTPs have been made by any provider to improve breathing since last visit: -   . Have you had exacerbation/flare-up since last visit? {yes/no:20286}   . What do you do when you are short of breath?  {SOBresposne:24139}***  Respiratory Devices/Equipment . Do you have a nebulizer? {yes/no:20286} . Do you use a Peak Flow Meter? {yes/no:20286} . Do you use a maintenance inhaler? {yes/no:20286} . How often do you forget to use your daily inhaler? *** . Do you use a rescue inhaler? {yes/no:20286} . How often do you use your rescue inhaler?  {CHL HP [facility] Inhaler [id number] . Do you use a spacer with your inhaler? {yes/no:20286}  Adherence Review: . Does the patient have >5 day gap between last estimated fill date for maintenance inhaler medications?  {yes/no:20286}   Star Rating Drugs: Atorvastatin 20 MG:  Katherine Summers, CPP Notified.  Katherine Summers, CMA Clinical Pharmacist Assistant [phone] CCM Total Time:

## 2020-12-13 LAB — HM MAMMOGRAPHY

## 2020-12-15 ENCOUNTER — Encounter: Payer: Self-pay | Admitting: Internal Medicine

## 2020-12-22 ENCOUNTER — Ambulatory Visit: Payer: Medicare Other | Admitting: Internal Medicine

## 2020-12-22 ENCOUNTER — Other Ambulatory Visit: Payer: Self-pay

## 2020-12-22 ENCOUNTER — Ambulatory Visit (INDEPENDENT_AMBULATORY_CARE_PROVIDER_SITE_OTHER): Payer: Medicare Other

## 2020-12-22 VITALS — BP 138/80 | HR 73 | Temp 98.5°F | Ht 65.4 in | Wt 178.4 lb

## 2020-12-22 DIAGNOSIS — Z0000 Encounter for general adult medical examination without abnormal findings: Secondary | ICD-10-CM | POA: Diagnosis not present

## 2020-12-22 NOTE — Progress Notes (Signed)
This visit occurred during the SARS-CoV-2 public health emergency.  Safety protocols were in place, including screening questions prior to the visit, additional usage of staff PPE, and extensive cleaning of exam room while observing appropriate contact time as indicated for disinfecting solutions.  Subjective:   Katherine Summers is a 75 y.o. female who presents for Medicare Annual (Subsequent) preventive examination.  Review of Systems     Cardiac Risk Factors include: advanced age (>54mn, >>31women);hypertension;obesity (BMI >30kg/m2);sedentary lifestyle     Objective:    Today's Vitals   12/22/20 1223  BP: 138/80  Pulse: 73  Temp: 98.5 F (36.9 C)  TempSrc: Oral  SpO2: 97%  Weight: 178 lb 6.4 oz (80.9 kg)  Height: 5' 5.4" (1.661 m)   Body mass index is 29.33 kg/m.  Advanced Directives 12/22/2020 11/12/2019 03/18/2019 10/31/2018 02/24/2018 10/31/2016 10/31/2016  Does Patient Have a Medical Advance Directive? Yes Yes Yes Yes No Yes Yes  Type of AParamedicof A[place]Living will [place] BeachLiving will [place]Living will Living will;Healthcare Power of [place]Living will -  Does patient want to make changes to medical advance directive? - - No - Patient declined No - Patient declined - No - Patient declined -  Copy of HIvorin Chart? No - copy requested No - copy requested No - copy requested No - copy requested - No - copy requested -  Would patient like information on creating a medical advance directive? - - - - No - Patient declined - -    Current Medications (verified) Outpatient Encounter Medications as of 12/22/2020  Medication Sig  . amLODipine (NORVASC) 5 MG tablet TAKE 1 TABLET(5 MG) BY MOUTH DAILY  . atorvastatin (LIPITOR) 20 MG tablet TAKE 1 TABLET BY MOUTH EVERY DAY  . cromolyn (OPTICROM) 4 % ophthalmic solution Place 1 drop into both eyes 2 (two) times  daily.  .Marland Kitchenescitalopram (LEXAPRO) 10 MG tablet TAKE 1 TABLET(10 MG) BY MOUTH DAILY  . lisinopril-hydrochlorothiazide (ZESTORETIC) 20-12.5 MG tablet TAKE 1 TABLET BY MOUTH DAILY  . Melatonin 2.5 MG CAPS One at bedtime po  . mirabegron ER (MYRBETRIQ) 50 MG TB24 tablet Take 50 mg by mouth daily.  . naproxen (NAPROSYN) 500 MG tablet Take 1 tablet by mouth every 12 (twelve) hours as needed.   . neomycin-polymyxin b-dexamethasone (MAXITROL) 3.5-10000-0.1 OINT SMARTSIG:1 Inch(es) In Eye(s) Every Night  . nystatin (NYSTATIN) powder Apply topically 4 (four) times daily.  . OXYBUTYNIN CHLORIDE PO   . Polyethylene Glycol 3350 (PEG 3350 PO) See admin instructions.  . prednisoLONE acetate (PRED FORTE) 1 % ophthalmic suspension   . VITAMIN D PO Take 5,000 mg by mouth. Take 1 daily mon through fri   No facility-administered encounter medications on file as of 12/22/2020.    Allergies (verified) Patient has no known allergies.   History: Past Medical History:  Diagnosis Date  . Allergy   . CKD (chronic kidney disease) stage 2, GFR 60-89 ml/min   . COPD (chronic obstructive pulmonary disease) ([place]   . Depression 08/01/2016  . Dizziness and giddiness   . Essential hypertension 08/01/2016  . Hyperlipidemia   . Hypertension   . Murmur 08/01/2016  . Pre-diabetes   . Shortness of breath 08/01/2016  . Vitamin D deficiency    Past Surgical History:  Procedure Laterality Date  . CATARACT EXTRACTION, BILATERAL  2021  . COLONOSCOPY    . EYE SURGERY    .  FOOT SURGERY    . TONSILLECTOMY    . 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 TUBAL LIGATION     Family History  Problem Relation Age of Onset  . Stroke Mother   . Colon cancer Father   . Colon cancer Sister   . Stroke Maternal Grandmother   . Colon cancer Paternal Grandfather     Social History   Socioeconomic History  . Marital status: Single    Spouse name: Not on file  . Number of children: Not on file  . Years of education: Not on file  . Highest education level: Not on file  Occupational History  . Occupation: retired  Tobacco Use  . Smoking status: Former Smoker    Packs/day: 0.25    Years: 12.00    Pack years: 3.00    Types: Cigarettes    Quit date: 1998    Years since quitting: 24.3  . Smokeless tobacco: Never Used  Vaping Use  . Vaping Use: Never used  Substance and Sexual Activity  . Alcohol use: Not Currently    Alcohol/week: 0.0 standard drinks  . Drug use: No  . Sexual activity: Not Currently  Other Topics Concern  . Not on file  Social History Narrative  . Not on file   Social Determinants of Health   Financial Resource Strain: Low Risk   . Difficulty of Paying Living Expenses: Not hard at all  Food Insecurity: No Food Insecurity  . Worried About Charity fundraiser in the Last Year: Never true  . Ran Out of Food in the Last Year: Never true  Transportation Needs: No Transportation Needs  . Lack of Transportation (Medical): No  . Lack of Transportation (Non-Medical): No  Physical Activity: Inactive  . Days of Exercise per Week: 0 days  . Minutes of Exercise per Session: 0 min  Stress: No Stress Concern Present  . Feeling of Stress : Not at all  Social Connections: Not on file    Tobacco Counseling Counseling given: Not Answered   Clinical Intake:  Pre-visit preparation completed: Yes  Pain : No/denies pain     Nutritional Status: BMI > 30  Obese Nutritional Risks: None Diabetes: No  How often do you need to have someone help you when you read instructions, pamphlets, or other written materials from your doctor or pharmacy?: 1 - Never What is the last grade level you completed in school?: 84yr college  Diabetic? no  Interpreter Needed?: No  Information entered by :: NAllen LPN   Activities of Daily  Living In your present state of health, do you have any difficulty performing the following activities: 12/22/2020 11/17/2020  Hearing? N N  Vision? Y N  Comment trouble with peripheral vision -  Difficulty concentrating or making decisions? N N  Walking or climbing stairs? Y Y  Dressing or bathing? N N  Doing errands, shopping? N N  Preparing Food and eating ? N -  Using the Toilet? N -  In the past six months, have you accidently leaked urine? Y -  Do you have problems with loss of bowel control? N -  Managing your Medications? N -  Managing your Finances? N -  Housekeeping or managing your Housekeeping? N -  Some recent data might be hidden    Patient Care Team: SGlendale Chard  MD as PCP - General (Internal Medicine) Rex Kras, Claudette Stapler, RN as Case Manager Mayford Knife, [place] (Pharmacist)  Indicate any recent Medical Services you may have received from other than [facility] providers in the past year (date may be approximate).     Assessment:   This is a routine wellness examination for Leonard.  Hearing/Vision screen  Hearing Screening   _0  _1  _2  _3  _4  _5  _6  _7  _8   Right ear:           Left ear:           Vision Screening Comments: Regular eye exams, Dr. Marin Comment  Dietary issues and exercise activities discussed: Current Exercise Habits: The patient does not participate in regular exercise at present  Goals    .  Exercise 150 min/wk Moderate Activity (pt-stated)    .  Lifestyle Change-Hypertension      Timeframe:  Long-Range Goal Priority:  High Start Date:                             Expected End Date:                       Follow Up Date 01/20/2021    - agree to work together to make changes    Why is this important?    The changes that you are asked to make may be hard to do.   This is especially true when the changes are life-long.   Knowing why it is important to you is the first step.   Working on the change with your family or  support person helps you not feel alone.   Reward yourself and family or support person when goals are met. This can be an activity you choose like bowling, hiking, biking, swimming or shooting hoops.         .  Manage My Medicine      Timeframe:  Long-Range Goal Priority:  High Start Date:     10/19/2020                        Expected End Date:    10/19/2021                   Follow Up Date: 11/17/2020   - call for medicine refill 2 or 3 days before it runs out - keep a list of all the medicines I take; vitamins and herbals too - use a pillbox to sort medicine    Why is this important?   . These steps will help you keep on track with your medicines.   Notes:  Follow up with any questions that I have about my medications     .  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  Minimize DM disease progression      Timeframe:  Long-Range Goal Priority:  Medium Start Date:  09/21/20                           Expected End Date: 12/20/20  Follow up date: 11/02/20  Over the next 90 days, patient will Self administers oral medications as prescribed Attends all scheduled provider appointments Adheres to prescribed ADA/carb modified Review and discuss printed DM educational materials at next RN CM f/u call  exercise 3-5 days/week                          .  Patient Stated      11/12/2019, wants to eat healthier    .  Patient Stated      12/22/2020, wants to walk better    .  Track and Manage My Blood Pressure-Hypertension      Timeframe:  Long-Range  Goal Priority:  Medium Start Date: 09/21/20                            Expected End Date: 12/20/20                     Follow Up Date 11/02/20   Patient Goals/Self-Care Activities . Over the next 90 days, patient will:  choose a place to take my blood pressure (home, clinic or office, retail store) write blood pressure results in a log or diary  work with the embedded BSW to explore [facility] OTC benefits, if eligible, purchase a BP cuff via this benefit -completed 1.27.22 Self administers medications as prescribed Attends all scheduled provider appointments Calls provider office for new concerns, questions, or BP outside discussed parameters Checks BP and records as discussed Follows a low sodium diet/DASH diet    Why is this important?    You won't feel high blood pressure, but it can still hurt your blood vessels.   High blood pressure can cause heart or kidney problems. It can also cause a stroke.   Making lifestyle changes like losing a little weight or eating less salt will help.   Checking your blood pressure at home and at different times of the day can help to control blood pressure.   If the doctor prescribes medicine remember to take it the way the doctor ordered.   Call the office if you cannot afford the medicine or if there are questions about it.     Notes:     .  Work with embedded Pharm D for cost assistance of Atorvastatin      Timeframe:  Long-Range Goal Priority:  High Start Date:  09/21/20                           Expected End Date:  12/20/20  Follow up Date: 11/02/20  Over the next 90 days, patient will:  -work with the embedded Pharm D for assistance with cost of Atorvastatin -take all prescription medications with no missed doses -refill medications when supply is getting low prior to running out of medication                          Depression Screen PHQ 2/9 Scores 12/22/2020 10/19/2020 11/12/2019 03/18/2019 03/04/2019 10/31/2018  PHQ - 2 Score 0 1 0 0 0  0   PHQ- 9 Score - - 3 - - 0    Fall Risk Fall Risk  12/22/2020 11/17/2020 11/12/2019 03/04/2019 10/31/2018  Falls in the past year? 1 0 _0 Comment - - tripped - -  Number falls in past yr: - - 1 0 1  Comment - - - - loss balance  Injury with Fall? - - 0 0 0  Risk Factor Category  - - - - -  Risk for fall due to : Medication side effect - Impaired balance/gait;Medication side effect - History of fall(s);Medication side effect  Follow up Falls evaluation completed;Education provided;Falls prevention discussed - Falls evaluation completed;Education provided;Falls prevention discussed - Education provided;Falls prevention discussed    FALL RISK PREVENTION PERTAINING TO THE HOME:  Any stairs in or around the home? No  If so, are there any without handrails? n/a Home free of loose throw rugs in walkways, pet beds, electrical cords, etc? Yes  Adequate lighting in your home to reduce risk of falls? Yes   ASSISTIVE DEVICES UTILIZED TO PREVENT FALLS:  Life alert? No  Use of a cane, walker or w/c? Yes  Grab bars in the bathroom? Yes  Shower chair or bench in shower? Yes  Elevated toilet seat or a handicapped toilet? No   TIMED UP AND GO:  Was the test performed? No . .   Gait slow and steady with assistive device  Cognitive Function:     6CIT Screen 12/22/2020 11/12/2019 10/31/2018  What Year? 0 points 0 points 0 points  What month? 0 points 0 points 0 points  What time? 0 points 0 points 0 points  Count back from 20 0 points 0 points 0 points  Months in reverse 0 points 0 points 0 points  Repeat phrase 0 points 2 points 0 points  Total Score 0 2 0    Immunizations Immunization History  Administered Date(s) Administered  . Moderna SARS-COV2 Booster Vaccination 07/05/2020  . Moderna Sars-Covid-2 Vaccination 10/18/2019, 11/15/2019  . PPD Test 12/05/2016  . Pneumococcal Conjugate-13 10/31/2018    TDAP status: Up to date  Flu Vaccine status: Declined, Education has been  provided regarding the importance of this vaccine but patient still declined. Advised may receive this vaccine at local pharmacy or Health Dept. Aware to provide a copy of the vaccination record if obtained from local pharmacy or Health Dept. Verbalized acceptance and understanding.  Pneumococcal vaccine status: Up to date  Covid-19 vaccine status: Completed vaccines  Qualifies for Shingles Vaccine? Yes   Zostavax completed No   Shingrix Completed?: No.    Education has been provided regarding the importance of this vaccine. Patient has been advised to call insurance company to determine out of pocket expense if they have not yet received this vaccine. Advised may also receive vaccine at local pharmacy or Health Dept. Verbalized acceptance and understanding.  Screening Tests Health Maintenance  Topic Date Due  . OPHTHALMOLOGY EXAM  07/19/2019  . FOOT EXAM  10/30/2020  . INFLUENZA VACCINE  04/04/2021  . HEMOGLOBIN A1C  05/20/2021  . MAMMOGRAM  12/14/2022  . TETANUS/TDAP  07/01/2023  . COLONOSCOPY (Pts 45-81yr Insurance coverage will need to be confirmed)  09/02/2029  . DEXA SCAN  Completed  . COVID-19 Vaccine  Completed  . Hepatitis C Screening  Completed  . PNA vac Low Risk Adult  Completed  . HPV VACCINES  Aged Out    Health Maintenance  Health Maintenance Due  Topic Date Due  .  OPHTHALMOLOGY EXAM  07/19/2019  . FOOT EXAM  10/30/2020    Colorectal cancer screening: Type of screening: Colonoscopy. Completed 09/03/2019. Repeat every 3 years  Mammogram status: Completed 12/13/2020. Repeat every year  Bone Density status: Completed 10/04/2018.   Lung Cancer Screening: (Low Dose CT Chest recommended if Age 63-80 years, 30 pack-year currently smoking OR have quit w/in 15years.) does not qualify.   Lung Cancer Screening Referral: no  Additional Screening:  Hepatitis C Screening: does qualify; Completed 01/02/2014  Vision Screening: Recommended annual ophthalmology exams for  early detection of glaucoma and other disorders of the eye. Is the patient up to date with their annual eye exam?  Yes  Who is the provider or what is the name of the office in which the patient attends annual eye exams? Dr. Marin Comment If pt is not established with a provider, would they like to be referred to a provider to establish care? No .   Dental Screening: Recommended annual dental exams for proper oral hygiene  Community Resource Referral / Chronic Care Management: CRR required this visit?  No   CCM required this visit?  No      Plan:     I have personally reviewed and noted the following in the patient's chart:   . Medical and social history . Use of alcohol, tobacco or illicit drugs  . Current medications and supplements . Functional ability and status . Nutritional status . Physical activity . Advanced directives . List of other physicians . Hospitalizations, surgeries, and ER visits in previous 12 months . Vitals . Screenings to include cognitive, depression, and falls . Referrals and appointments  In addition, I have reviewed and discussed with patient certain preventive protocols, quality metrics, and best practice recommendations. A written personalized care plan for preventive services as well as general preventive health recommendations were provided to patient.     Kellie Simmering, LPN   [id number]   Nurse Notes:

## 2020-12-22 NOTE — Patient Instructions (Signed)
Katherine Summers , Thank you for taking time to come for your Medicare Wellness Visit. I appreciate your ongoing commitment to your health goals. Please review the following plan we discussed and let me know if I can assist you in the future.   Screening recommendations/referrals: Colonoscopy: completed 09/03/2019, due 09/02/2022 Mammogram: completed 12/13/2020 Bone Density: completed 10/04/2018 Recommended yearly ophthalmology/optometry visit for glaucoma screening and checkup Recommended yearly dental visit for hygiene and checkup  Vaccinations: Influenza vaccine: decline Pneumococcal vaccine: completed 10/31/2018 Tdap vaccine: completed 06/30/2013, due 07/01/2023 Shingles vaccine: discussed   Covid-19: 07/05/2020, 11/15/2019, 10/18/2019  Advanced directives: Please bring a copy of your POA (Power of Attorney) and/or Living Will to your next appointment.   Conditions/risks identified: none  Next appointment: Follow up in one year for your annual wellness visit    Preventive Care 65 Years and Older, Female Preventive care refers to lifestyle choices and visits with your health care provider that can promote health and wellness. What does preventive care include?  A yearly physical exam. This is also called an annual well check.  Dental exams once or twice a year.  Routine eye exams. Ask your health care provider how often you should have your eyes checked.  Personal lifestyle choices, including:  Daily care of your teeth and gums.  Regular physical activity.  Eating a healthy diet.  Avoiding tobacco and drug use.  Limiting alcohol use.  Practicing safe sex.  Taking low-dose aspirin every day.  Taking vitamin and mineral supplements as recommended by your health care provider. What happens during an annual well check? The services and screenings done by your health care provider during your annual well check will depend on your age, overall health, lifestyle risk factors, and  family history of disease. Counseling  Your health care provider may ask you questions about your:  Alcohol use.  Tobacco use.  Drug use.  Emotional well-being.  Home and relationship well-being.  Sexual activity.  Eating habits.  History of falls.  Memory and ability to understand (cognition).  Work and work Astronomer.  Reproductive health. Screening  You may have the following tests or measurements:  Height, weight, and BMI.  Blood pressure.  Lipid and cholesterol levels. These may be checked every 5 years, or more frequently if you are over 46 years old.  Skin check.  Lung cancer screening. You may have this screening every year starting at age 62 if you have a 30-pack-year history of smoking and currently smoke or have quit within the past 15 years.  Fecal occult blood test (FOBT) of the stool. You may have this test every year starting at age 100.  Flexible sigmoidoscopy or colonoscopy. You may have a sigmoidoscopy every 5 years or a colonoscopy every 10 years starting at age 47.  Hepatitis C blood test.  Hepatitis B blood test.  Sexually transmitted disease (STD) testing.  Diabetes screening. This is done by checking your blood sugar (glucose) after you have not eaten for a while (fasting). You may have this done every 1-3 years.  Bone density scan. This is done to screen for osteoporosis. You may have this done starting at age 88.  Mammogram. This may be done every 1-2 years. Talk to your health care provider about how often you should have regular mammograms. Talk with your health care provider about your test results, treatment options, and if necessary, the need for more tests. Vaccines  Your health care provider may recommend certain vaccines, such as:  Influenza vaccine.  This is recommended every year.  Tetanus, diphtheria, and acellular pertussis (Tdap, Td) vaccine. You may need a Td booster every 10 years.  Zoster vaccine. You may need this  after age 20.  Pneumococcal 13-valent conjugate (PCV13) vaccine. One dose is recommended after age 34.  Pneumococcal polysaccharide (PPSV23) vaccine. One dose is recommended after age 58. Talk to your health care provider about which screenings and vaccines you need and how often you need them. This information is not intended to replace advice given to you by your health care provider. Make sure you discuss any questions you have with your health care provider. Document Released: 09/17/2015 Document Revised: 05/10/2016 Document Reviewed: 06/22/2015 Elsevier Interactive Patient Education  2017 [place] Prevention in the Home Falls can cause injuries. They can happen to people of all ages. There are many things you can do to make your home safe and to help prevent falls. What can I do on the outside of my home?  Regularly fix the edges of walkways and driveways and fix any cracks.  Remove anything that might make you trip as you walk through a door, such as a raised step or threshold.  Trim any bushes or trees on the path to your home.  Use bright outdoor lighting.  Clear any walking paths of anything that might make someone trip, such as rocks or tools.  Regularly check to see if handrails are loose or broken. Make sure that both sides of any steps have handrails.  Any raised decks and porches should have guardrails on the edges.  Have any leaves, snow, or ice cleared regularly.  Use sand or salt on walking paths during winter.  Clean up any spills in your garage right away. This includes oil or grease spills. What can I do in the bathroom?  Use night lights.  Install grab bars by the toilet and in the tub and shower. Do not use towel bars as grab bars.  Use non-skid mats or decals in the tub or shower.  If you need to sit down in the shower, use a plastic, non-slip stool.  Keep the floor dry. Clean up any water that spills on the floor as soon as it  happens.  Remove soap buildup in the tub or shower regularly.  Attach bath mats securely with double-sided non-slip rug tape.  Do not have throw rugs and other things on the floor that can make you trip. What can I do in the bedroom?  Use night lights.  Make sure that you have a light by your bed that is easy to reach.  Do not use any sheets or blankets that are too big for your bed. They should not hang down onto the floor.  Have a firm chair that has side arms. You can use this for support while you get dressed.  Do not have throw rugs and other things on the floor that can make you trip. What can I do in the kitchen?  Clean up any spills right away.  Avoid walking on wet floors.  Keep items that you use a lot in easy-to-reach places.  If you need to reach something above you, use a strong step stool that has a grab bar.  Keep electrical cords out of the way.  Do not use floor polish or wax that makes floors slippery. If you must use wax, use non-skid floor wax.  Do not have throw rugs and other things on the floor that can make  you trip. What can I do with my stairs?  Do not leave any items on the stairs.  Make sure that there are handrails on both sides of the stairs and use them. Fix handrails that are broken or loose. Make sure that handrails are as long as the stairways.  Check any carpeting to make sure that it is firmly attached to the stairs. Fix any carpet that is loose or worn.  Avoid having throw rugs at the top or bottom of the stairs. If you do have throw rugs, attach them to the floor with carpet tape.  Make sure that you have a light switch at the top of the stairs and the bottom of the stairs. If you do not have them, ask someone to add them for you. What else can I do to help prevent falls?  Wear shoes that:  Do not have high heels.  Have rubber bottoms.  Are comfortable and fit you well.  Are closed at the toe. Do not wear sandals.  If you  use a stepladder:  Make sure that it is fully opened. Do not climb a closed stepladder.  Make sure that both sides of the stepladder are locked into place.  Ask someone to hold it for you, if possible.  Clearly mark and make sure that you can see:  Any grab bars or handrails.  First and last steps.  Where the edge of each step is.  Use tools that help you move around (mobility aids) if they are needed. These include:  Canes.  Walkers.  Scooters.  Crutches.  Turn on the lights when you go into a dark area. Replace any light bulbs as soon as they burn out.  Set up your furniture so you have a clear path. Avoid moving your furniture around.  If any of your floors are uneven, fix them.  If there are any pets around you, be aware of where they are.  Review your medicines with your doctor. Some medicines can make you feel dizzy. This can increase your chance of falling. Ask your doctor what other things that you can do to help prevent falls. This information is not intended to replace advice given to you by your health care provider. Make sure you discuss any questions you have with your health care provider. Document Released: 06/17/2009 Document Revised: 01/27/2016 Document Reviewed: 09/25/2014 Elsevier Interactive Patient Education  2017 [employer].

## 2021-01-03 ENCOUNTER — Telehealth: Payer: Medicare Other

## 2021-01-03 ENCOUNTER — Ambulatory Visit (INDEPENDENT_AMBULATORY_CARE_PROVIDER_SITE_OTHER): Payer: Medicare Other

## 2021-01-03 DIAGNOSIS — M79671 Pain in right foot: Secondary | ICD-10-CM

## 2021-01-03 DIAGNOSIS — M79672 Pain in left foot: Secondary | ICD-10-CM

## 2021-01-03 DIAGNOSIS — J449 Chronic obstructive pulmonary disease, unspecified: Secondary | ICD-10-CM

## 2021-01-03 DIAGNOSIS — R7303 Prediabetes: Secondary | ICD-10-CM

## 2021-01-03 DIAGNOSIS — I10 Essential (primary) hypertension: Secondary | ICD-10-CM

## 2021-01-03 DIAGNOSIS — N3281 Overactive bladder: Secondary | ICD-10-CM

## 2021-01-13 NOTE — Patient Instructions (Signed)
Goals Addressed      Patient Stated   .  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  Minimize DM disease progression   On track     Timeframe:  Long-Range Goal Priority:  Medium Start Date:  09/21/20                           Expected End Date: 09/21/21  Follow up date: 02/09/21  Self administers oral medications as prescribed Attends all scheduled provider appointments Adheres to prescribed ADA/carb modified Review and discuss printed DM educational materials at next RN CM f/u call  exercise 3-5 days/week                          .  Track and Manage My Blood Pressure-Hypertension   On track     Timeframe:  Long-Range Goal Priority:  Medium Start Date: 09/21/20                            Expected End Date: 09/21/21                     Follow Up Date: 02/09/21   Patient Goals/Self-Care Activities Self administers medications as prescribed Attends all scheduled provider appointments Calls provider office for new concerns, questions, or BP outside discussed parameters Checks BP and records as discussed Follows a low sodium diet/DASH diet  Why is this important?    You won't feel high blood pressure, but it can still hurt your blood vessels.   High blood pressure can cause heart or kidney problems. It can also cause a stroke.   Making lifestyle changes like losing a Raynaldo Falco weight or eating less salt will help.   Checking your blood pressure at home and at different times of the day can help to control blood pressure.   If the doctor prescribes medicine remember to take  it the way the doctor ordered.   Call the office if you cannot afford the medicine or if there are questions about it.     Notes:     .  COMPLETED: Work with embedded Pharm D for cost assistance of Atorvastatin        Timeframe:  Long-Range Goal Priority:  High Start Date:  09/21/20                           Expected End Date:  12/20/20  Follow up Date: 11/02/20  Over the next 90 days, patient will:  -work with the embedded Pharm D for assistance with cost of Atorvastatin -take all prescription medications with no missed doses -refill medications when supply is getting low prior to  running out of medication

## 2021-01-13 NOTE — Chronic Care Management (AMB) (Signed)
Chronic Care Management   CCM RN Visit Note  01/03/2021 Name: Katherine Summers MRN: [id number] DOB: [birth year]  Subjective: Katherine Summers is a 75 y.o. year old female who is a primary care patient of Glendale Chard, MD. The care management team was consulted for assistance with disease management and care coordination needs.    Engaged with patient by telephone for follow up visit in response to provider referral for case management and/or care coordination services.   Consent to Services:  The patient was given information about Chronic Care Management services, agreed to services, and gave verbal consent prior to initiation of services.  Please see initial visit note for detailed documentation.   Patient agreed to services and verbal consent obtained.   Assessment: Review of patient past medical history, allergies, medications, health status, including review of consultants reports, laboratory and other test data, was performed as part of comprehensive evaluation and provision of chronic care management services.   SDOH (Social Determinants of Health) assessments and interventions performed:  Yes, no acute needs  CCM Care Plan  No Known Allergies  Outpatient Encounter Medications as of 01/03/2021  Medication Sig  . amLODipine (NORVASC) 5 MG tablet TAKE 1 TABLET(5 MG) BY MOUTH DAILY  . atorvastatin (LIPITOR) 20 MG tablet TAKE 1 TABLET BY MOUTH EVERY DAY  . cromolyn (OPTICROM) 4 % ophthalmic solution Place 1 drop into both eyes 2 (two) times daily.  Marland Kitchen escitalopram (LEXAPRO) 10 MG tablet TAKE 1 TABLET(10 MG) BY MOUTH DAILY  . lisinopril-hydrochlorothiazide (ZESTORETIC) 20-12.5 MG tablet TAKE 1 TABLET BY MOUTH DAILY  . Melatonin 2.5 MG CAPS One at bedtime po  . mirabegron ER (MYRBETRIQ) 50 MG TB24 tablet Take 50 mg by mouth daily.  . naproxen (NAPROSYN) 500 MG tablet Take 1 tablet by mouth every 12 (twelve) hours as needed.   . neomycin-polymyxin b-dexamethasone (MAXITROL) 3.5-10000-0.1  OINT SMARTSIG:1 Inch(es) In Eye(s) Every Night  . nystatin (NYSTATIN) powder Apply topically 4 (four) times daily.  . OXYBUTYNIN CHLORIDE PO   . Polyethylene Glycol 3350 (PEG 3350 PO) See admin instructions.  . prednisoLONE acetate (PRED FORTE) 1 % ophthalmic suspension   . VITAMIN D PO Take 5,000 mg by mouth. Take 1 daily mon through fri   No facility-administered encounter medications on file as of 01/03/2021.    Patient Active Problem List   Diagnosis Date Noted  . Unintentional weight loss 12/03/2020  . Pure hypercholesterolemia 12/03/2020  . OAB (overactive bladder) 12/03/2020  . Alkaline phosphatase raised 10/05/2020  . 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 Family history of malignant neoplasm of gastrointestinal tract 10/05/2020  . Personal history of colonic polyps 10/05/2020  . Impairment of balance 12/18/2019  . Muscle weakness 12/18/2019  . Urinary frequency 10/31/2018  . Prediabetes 10/31/2018  . OSA on CPAP 07/22/2018  . CPAP use counseling 07/22/2018  . Abnormal dreams 07/22/2018  . Chronic obstructive pulmonary disease ([place]) 03/21/2018  . Other fatigue 03/21/2018  . Snorings 03/21/2018  . Nocturia more than twice per night 03/21/2018  . 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 Pain in right knee 01/04/2018  . Overweight (BMI 25.0-29.9) 11/01/2016  . 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 Murmur 08/01/2016  . Shortness of breath 08/01/2016  . Abnormal EKG 08/01/2016  . Essential hypertension 08/01/2016  . Depression 08/01/2016    Conditions to be addressed/monitored:HTN, COPD, Prediabetes, Overactive bladder, Foot pain, bilateral   Care Plan : Wellness (Adult)  Updates made by Lynne Logan, RN since 01/13/2021 12:00  AM  Completed 01/13/2021  Problem: Medication Adherence (Wellness) Resolved 01/03/2021  Priority: High    Long-Range Goal: Medication Adherence Maintained Completed 01/03/2021  Start Date: 09/21/2020  Expected End Date: 12/20/2020  Recent Progress: On  track  Priority: High  Note:   Current Barriers:   Ineffective Self Health Maintenance  Unable to afford medication (Atorvastatin)  Currently UNABLE TO independently self manage needs related to chronic health conditions.   Knowledge Deficits related to short term plan for care coordination needs and long term plans for chronic disease management needs Case Manager Clinical Goal(s):  Marland Kitchen Collaboration with Glendale Chard, MD regarding development and update of comprehensive plan of care as evidenced by provider attestation and co-signature . Inter-disciplinary care team collaboration (see longitudinal plan of care)  Over the next 90 days, patient will work with care management team to address care coordination and chronic disease management needs related to Disease Management  Educational Needs  Care Coordination  Medication Management and Education  Medication Assistance   Psychosocial Support   Interventions:  01/03/21 call completed with patient   Determined patient worked with the embedded Pharm D for financial assistance with Atorvastatin  Determined patient is adhering to taking her Atorvastatin as prescribed without missed doses  Determined patient's lipids are within target range at this time  Reinforced the importance of medication adherence; Educated on dietary and exercise recommendations  Patient Goals/Self-Care Activities Over the next 90 days, patient will:  -work with the embedded Pharm D for assistance with cost of Atorvastatin -take all prescription medications with no missed doses -refill medications when supply is getting low prior to running out of medication   Follow Up Plan: Goal Met    Care Plan : Diabetes Type 2 (Adult)  Updates made by Lynne Logan, RN since 01/13/2021 12:00 AM    Problem: Disease Progression (Diabetes, Type 2)   Priority: Medium    Long-Range Goal: Disease Progression Prevented or Minimized   Start Date: 09/21/2020   Expected End Date: 09/21/2021  Recent Progress: On track  Priority: Medium  Note:   Objective:  Lab Results  Component Value Date   HGBA1C 5.8 (H) 11/17/2020 .   Lab Results  Component Value Date   CREATININE 0.76 11/17/2020   CREATININE 0.85 05/20/2020   CREATININE 0.87 11/12/2019 .   Lab Results  Component Value Date   EGFR 83 11/17/2020   Current Barriers:  Marland Kitchen Knowledge Deficits related to basic Diabetes pathophysiology and self care/management . Knowledge Deficits related to medications used for management of diabetes . Does not have glucometer to monitor blood sugar Case Manager Clinical Goal(s):  Marland Kitchen Collaboration with Glendale Chard, MD regarding development and update of comprehensive plan of care as evidenced by provider attestation and co-signature . Inter-disciplinary care team collaboration (see longitudinal plan of care) . Patient will demonstrate improved adherence to prescribed treatment plan for diabetes self care/management as evidenced by:  . adherence to ADA/ carb modified diet . exercise 3-5 days/week Interventions:  01/03/21 completed successful outbound call with patient  . Provided education to patient about basic DM disease process . Review of patient status, including review of consultants reports, relevant laboratory and other test results, and medications completed. . Reviewed medications with patient and discussed importance of medication adherence . Mailed printed educational material related to Prediabetes; Educated on dietary and exercise recommendations  . Discussed plans with patient for ongoing care management follow up and provided patient with direct contact information for care management team Patient  Goals/Self-Care Activities . Self administers oral medications as prescribed . Attends all scheduled provider appointments . Adheres to prescribed ADA/carb modified . Review and discuss printed DM educational materials at next RN CM f/u  call . exercise 3-5 days/week   Follow Up Plan: Telephone follow up appointment with care management team member scheduled for: 02/09/21   Care Plan : Hypertension (Adult)  Updates made by Lynne Logan, RN since 01/13/2021 12:00 AM    Problem: Disease Progression (Hypertension)   Priority: Medium    Long-Range Goal: Disease Progression Prevented or Minimized   Start Date: 09/21/2020  Expected End Date: 09/21/2021  Recent Progress: On track  Priority: Medium  Note:   Objective:  . Last practice recorded BP readings:  BP Readings from Last 3 Encounters:  12/22/20 138/80  11/17/20 124/86  05/20/20 118/70 .   Marland Kitchen Most recent eGFR/CrCl:  Lab Results  Component Value Date   EGFR 83 11/17/2020 .    No components found for: CRCL  Current Barriers:  Marland Kitchen Knowledge Deficits related to basic understanding of hypertension pathophysiology and self care management . Knowledge Deficits related to understanding of medications prescribed for management of hypertension Case Manager Clinical Goal(s):  . patient will demonstrate improved adherence to prescribed treatment plan for hypertension as evidenced by taking all medications as prescribed, monitoring and recording blood pressure as directed, adhering to low sodium/DASH diet Interventions:  01/03/21 completed successful outbound call with patient  . Collaboration with Glendale Chard, MD regarding development and update of comprehensive plan of care as evidenced by provider attestation and co-signature . Inter-disciplinary care team collaboration (see longitudinal plan of care) . Evaluation of current treatment plan related to hypertension self management and patient's adherence to plan as established by provider. . Reviewed medications with patient and discussed importance of compliance . Educated on target BP <130/80; Educated on dietary and exercise recommendations, including recommendations to follow a low Sodium diet  . Advised patient,  providing education and rationale, to monitor blood pressure daily and record, calling PCP for findings outside established parameters  . Discussed plans with patient for ongoing care management follow up and provided patient with direct contact information for care management team SW Interventions Completed 1.27.22 . 1:1 collaboration with Glendale Chard, MD regarding development and update of comprehensive plan of care as evidenced by provider attestation and co-signature . Inter-disciplinary care team collaboration (see longitudinal plan of care) . Successful outbound call placed to the patient to assist with verifying health plan benefits . Placed joint call to [facility] . Determined the patient does not have an OTC benefit . Informed the patients health plan would cover the cost of a BP Monitor if the patient were on dialysis . Discussed with the patient the opportunity to request a prescription be sent to [place] to determine out of pocket costs for the patient - patient declined stating she would not drive that far . Patient reports she has a BP cuff in the home and will look for it; if patient can not locate she will purchase a new one from [facility] . Discussed the patient is not sure if her current BP cuff is accurate . Encouraged the patient to bring it with her to her next OV to determine accuracy compared to a manual cuff . Collaboration with RN Care Manager to inform of today's interventions and plan Patient Goals/Self-Care Activities Self administers medications as prescribed Attends all scheduled provider appointments Calls provider office for new concerns, questions, or  BP outside discussed parameters Checks BP and records as discussed Follows a low sodium diet/DASH diet  Follow Up Plan: Telephone follow up appointment with care management team member scheduled for: 02/09/21   Care Plan : Bladder dysfunction  Updates made by Lynne Logan, RN since 01/13/2021  12:00 AM    Problem: Bladder dysfunction   Priority: Medium    Long-Range Goal: Manage urinary frequency   Start Date: 09/21/2020  Expected End Date: 03/21/2021  Recent Progress: On track  Priority: Medium  Note:   Current Barriers:   Ineffective Self Health Maintenance  Currently UNABLE TO independently self manage needs related to chronic health conditions.   Knowledge Deficits related to short term plan for care coordination needs and long term plans for chronic disease management needs Case Manager Clinical Goal(s):  Marland Kitchen Collaboration with Glendale Chard, MD regarding development and update of comprehensive plan of care as evidenced by provider attestation and co-signature . Inter-disciplinary care team collaboration (see longitudinal plan of care)  Patient will work with care management team to address care coordination and chronic disease management needs related to Disease Management  Educational Needs  Care Coordination  Medication Management and Education  Psychosocial Support   Interventions:  01/03/21 completed successful outbound call with patient  . Provided education to patient about basic disease process related to Impaired Urinary Elimination  . Review of patient status, including review of consultants reports, relevant laboratory and other test results, and medications completed. . Reviewed medications with patient and discussed importance of medication adherence  Notified patient of appointment with PCP follow up with Dr. Baird Cancer is scheduled for 02/01/21 $RemoveBef'@9'CopWEJmyYq$ :15 AM to f/u on urinary status with use of Myrbetriq  Discussed plans with patient for ongoing care management follow up and provided patient with direct contact information for care management team Patient Goals/Self-Care Activities:  . f/u with Urology Specialist for evaluation and treatment of over active bladder  . call PCP for new or worsening symptoms or concerns related to this condition . take  prescription medications exactly as prescribed by MD   Follow Up Plan: Telephone follow up appointment with care management team member scheduled for: 02/09/21      Plan:Telephone follow up appointment with care management team member scheduled for:  02/09/21  Barb Merino, RN, BSN, CCM Care Management Coordinator [place] [facility]  Direct Phone: [phone]

## 2021-01-17 ENCOUNTER — Ambulatory Visit: Payer: Medicare Other | Admitting: Podiatry

## 2021-01-17 ENCOUNTER — Telehealth: Payer: Self-pay | Admitting: *Deleted

## 2021-01-17 NOTE — Chronic Care Management (AMB) (Signed)
  Care Management   Note  01/17/2021 Name: Katherine Summers MRN: [id number] DOB: Apr 05, 1946  Katherine Summers is a 76 y.o. year old female who is a primary care patient of Dorothyann Peng, MD and is actively engaged with the care management team. I reached out to Hershal Coria by phone today to assist with re-scheduling a follow up visit with the Pharmacist.  Follow up plan: Unsuccessful telephone outreach attempt made. A HIPAA compliant phone message was left for the patient providing contact information and requesting a return call. The care management team will reach out to the patient again over the next 7 days. If patient returns call to provider office, please advise to call Embedded Care Management Care Guide Gwenevere Ghazi at [phone].  Gwenevere Ghazi  Care Guide, Embedded Care Coordination [facility] Management

## 2021-01-19 ENCOUNTER — Ambulatory Visit: Payer: Medicare Other | Admitting: Internal Medicine

## 2021-01-20 ENCOUNTER — Telehealth: Payer: Self-pay

## 2021-01-24 NOTE — Chronic Care Management (AMB) (Signed)
  Care Management   Note  01/24/2021 Name: WALAA CAREL MRN: [id number] DOB: [birth year]  MAURISHA MONGEAU is a 75 y.o. year old female who is a primary care patient of Dorothyann Peng, MD and is actively engaged with the care management team. I reached out to Hershal Coria by phone today to assist with re-scheduling a follow up visit with the Pharmacist  Follow up plan: Telephone appointment with care management team member scheduled for:03/09/2021  [facility] Guide, Embedded Care Coordination [facility] Management

## 2021-02-01 ENCOUNTER — Ambulatory Visit: Payer: Medicare Other | Admitting: Internal Medicine

## 2021-02-01 ENCOUNTER — Telehealth: Payer: Self-pay

## 2021-02-01 NOTE — Telephone Encounter (Signed)
The pt canceled her appt and said that she hasn't heard about her pulmonary referral. I told the pt that I would let the provider know.

## 2021-02-02 ENCOUNTER — Other Ambulatory Visit: Payer: Self-pay | Admitting: Internal Medicine

## 2021-02-02 DIAGNOSIS — J449 Chronic obstructive pulmonary disease, unspecified: Secondary | ICD-10-CM

## 2021-02-07 ENCOUNTER — Telehealth: Payer: Self-pay

## 2021-02-07 NOTE — Chronic Care Management (AMB) (Signed)
    Chronic Care Management Pharmacy Assistant   Name: Katherine Summers  MRN: [id number] DOB: 1946/08/26  Reason for Encounter: Disease State/ Hypertension   Recent office visits:  12-22-2020 Barb Merino, LPN  [phone] Little, Karma Lew, RN (CCM)  Recent consult visits:  None  Hospital visits:  None in previous 6 months  Medications: Outpatient Encounter Medications as of 02/07/2021  Medication Sig   amLODipine (NORVASC) 5 MG tablet TAKE 1 TABLET(5 MG) BY MOUTH DAILY   atorvastatin (LIPITOR) 20 MG tablet TAKE 1 TABLET BY MOUTH EVERY DAY   cromolyn (OPTICROM) 4 % ophthalmic solution Place 1 drop into both eyes 2 (two) times daily.   escitalopram (LEXAPRO) 10 MG tablet TAKE 1 TABLET(10 MG) BY MOUTH DAILY   lisinopril-hydrochlorothiazide (ZESTORETIC) 20-12.5 MG tablet TAKE 1 TABLET BY MOUTH DAILY   Melatonin 2.5 MG CAPS One at bedtime po   mirabegron ER (MYRBETRIQ) 50 MG TB24 tablet Take 50 mg by mouth daily.   naproxen (NAPROSYN) 500 MG tablet Take 1 tablet by mouth every 12 (twelve) hours as needed.    neomycin-polymyxin b-dexamethasone (MAXITROL) 3.5-10000-0.1 OINT SMARTSIG:1 Inch(es) In Eye(s) Every Night   nystatin (NYSTATIN) powder Apply topically 4 (four) times daily.   OXYBUTYNIN CHLORIDE PO    Polyethylene Glycol 3350 (PEG 3350 PO) See admin instructions.   prednisoLONE acetate (PRED FORTE) 1 % ophthalmic suspension    VITAMIN D PO Take 5,000 mg by mouth. Take 1 daily mon through fri   No facility-administered encounter medications on file as of 02/07/2021.    02-07-2021: 1st attempt Left VM on both lines 02-17-2021: 2nd attempt Left VM on both lines 02-21-2021: 3rd attempt Left VM on both lines  Star Rating Drugs: Atorvastatin 20 MG- Last filled 12-06-2020 90 DS [facility] Lisinopril-HCTZ 20-12.5 MG- Last filled 01-15-2021 43 DS [facility]  [facility] CMA Clinical Pharmacist Assistant [phone]

## 2021-02-09 ENCOUNTER — Telehealth: Payer: Self-pay

## 2021-02-09 ENCOUNTER — Telehealth: Payer: Medicare Other

## 2021-02-09 NOTE — Telephone Encounter (Signed)
  Care Management   Follow Up Note   02/09/2021 Name: TOMICA ARSENEAULT MRN: [id number] DOB: 12/17/1945   Referred by: Dorothyann Peng, MD Reason for referral : Chronic Care Management (RNCM Follow up call - 1st attempt )   An unsuccessful telephone outreach was attempted today. The patient was referred to the case management team for assistance with care management and care coordination.   Follow Up Plan: A HIPPA compliant phone message was left for the patient providing contact information and requesting a return call.   Delsa Sale, RN, BSN, CCM Care Management Coordinator [facility] Care Management/[facility]  Direct Phone: [phone]

## 2021-03-05 ENCOUNTER — Other Ambulatory Visit: Payer: Self-pay | Admitting: Internal Medicine

## 2021-03-08 ENCOUNTER — Telehealth: Payer: Self-pay

## 2021-03-08 NOTE — Chronic Care Management (AMB) (Signed)
    Chronic Care Management Pharmacy Assistant   Name: GUSTAVIA CARIE  MRN: [id number] DOB: Jan 20, 1946  03/08/2021- Patient called to remind of appointment with Cherylin Mylar, CPP on 03/09/2021 at 9:30 am  Patient aware of appointment date, time, and type of appointment (either telephone or in person). Patient aware to have/bring all medications, supplements, blood pressure and/or blood sugar logs to visit.  Questions: Have you had any recent office visit or specialist visit outside of [facility] systems? None. Are there any concerns you would like to discuss during your office visit? None per patient.  Are you having any problems obtaining your medications? (Whether it pharmacy issues or cost) No per patient but she did receive a call from [facility] and not sure what it was for, she said she had enough medications until next week, patient aware I will contact [facility] to find out what medications she was called for and Cherylin Mylar, CPP will update her tomorrow morning at her visit.   If patient has any PAP medications ask if they are having any problems getting their PAP medication or refill? No PAP  Star Rating Drug: Atorvastatin 20 mg- Last filled 03/05/2021 for 90 day supply at [facility]. Lisinopril HCTZ 20/12.5 mg- Last filled 03/05/2021 for 90 day supply at [facility].  Any gaps in medications fill history? No.  Spoke with [facility] pharmacy techinician, Lexapro, Lisinopril-HcTZ, Atorvastatin and Amlodipine were filled and ready for pick up. Patient to be notified by Cherylin Mylar, CPP at visit.   Care Gaps:  Zoster Vaccines- Shingrix OPHTHALMOLOGY EXAM (Yearly) 2022 COVID-19 Vaccine (4 - Booster for Moderna series) 2022 FOOT EXAM (Yearly)    SIG: Billee Cashing, CMA Clinical Pharmacist Assistant [phone]

## 2021-03-09 ENCOUNTER — Encounter: Payer: Self-pay | Admitting: Pulmonary Disease

## 2021-03-09 ENCOUNTER — Ambulatory Visit (INDEPENDENT_AMBULATORY_CARE_PROVIDER_SITE_OTHER): Payer: Medicare Other

## 2021-03-09 DIAGNOSIS — I10 Essential (primary) hypertension: Secondary | ICD-10-CM | POA: Diagnosis not present

## 2021-03-09 DIAGNOSIS — J449 Chronic obstructive pulmonary disease, unspecified: Secondary | ICD-10-CM | POA: Diagnosis not present

## 2021-03-09 DIAGNOSIS — E7800 Pure hypercholesterolemia, unspecified: Secondary | ICD-10-CM

## 2021-03-09 NOTE — Progress Notes (Addendum)
Chronic Care Management Pharmacy Note  03/15/2021 Name:  Katherine Summers MRN:  [id number] DOB:  1946/07/31  Summary: Katherine Summers is disappointed because she has not received the nebulizer machine, and she has not had a pulmonology appointment. Patient reports that she is disappointed that her breathing is not as good as it has been. She would like   Recommendations/Changes made from today's visit: Recommended patient continue to take medication everyday  Recommended patient receive COVID-19 4th booster shot  Recommended patient receive shingrix vaccine   Plan: I will contact the [facility] team to determine when they will be in contact with Katherine Summers Patient to continue taking medication every day    Subjective: Katherine Summers is an 75 y.o. year old female who is a primary patient of Katherine Chard, MD.  The CCM team was consulted for assistance with disease management and care coordination needs.    Engaged with patient by telephone for follow up visit in response to provider referral for pharmacy case management and/or care coordination services. Patient reports that she has been feeling tired. She reports that she has been waiting for someone to call her from the pulmonology office but she has not heard back. Patient reports that she has not been using her CPAP machine because it is uncomfortable. She reports that she thought she was going to a [employer]  Consent to Services:  The patient was given information about Chronic Care Management services, agreed to services, and gave verbal consent prior to initiation of services.  Please see initial visit note for detailed documentation.   Patient Care Team: Katherine Chard, MD as PCP - General (Internal Medicine) Lynne Logan, RN as Case Manager Mayford Knife, [facility] (Pharmacist)  Recent office visits: 3/16/20212 PCP OV - started on Myrbetriq 50 mg daily   Recent consult visits: 10/05/2020 [facility] visits: None in previous  6 months   Objective:  Lab Results  Component Value Date   CREATININE 0.76 11/17/2020   BUN 17 11/17/2020   GFRNONAA 68 05/20/2020   GFRAA 79 05/20/2020   NA 137 11/17/2020   K 3.4 (L) 11/17/2020   CALCIUM 9.7 11/17/2020   CO2 23 11/17/2020   GLUCOSE 106 (H) 11/17/2020    Lab Results  Component Value Date/Time   HGBA1C 5.8 (H) 11/17/2020 10:24 AM   HGBA1C 6.3 (H) 05/20/2020 09:55 AM   MICROALBUR 30 11/17/2020 11:38 AM   MICROALBUR 30 11/12/2019 12:34 PM    Last diabetic Eye exam:  Lab Results  Component Value Date/Time   HMDIABEYEEXA No Retinopathy 07/18/2018 12:00 AM    Last diabetic Foot exam: No results found for: HMDIABFOOTEX   Lab Results  Component Value Date   CHOL 133 11/17/2020   HDL 59 11/17/2020   LDLCALC 61 11/17/2020   TRIG 62 11/17/2020   CHOLHDL 2.3 11/17/2020    Hepatic Function Latest Ref Rng & Units 11/17/2020 05/20/2020 11/12/2019  Total Protein 6.0 - 8.5 g/dL 7.2 6.7 7.3  Albumin 3.7 - 4.7 g/dL 4.4 4.1 4.4  AST 0 - 40 IU/L $Remov'22 16 19  'bTQsyn$ ALT 0 - 32 IU/L $Remov'14 11 15  'kDurng$ Alk Phosphatase 44 - 121 IU/L 128(H) 120 125(H)  Total Bilirubin 0.0 - 1.2 mg/dL 0.4 0.4 0.4    Lab Results  Component Value Date/Time   TSH 1.440 11/17/2020 10:24 AM    CBC Latest Ref Rng & Units 11/17/2020 11/12/2019 10/31/2018  WBC 3.4 - 10.8 x10E3/uL 10.8 10.7 10.4  Hemoglobin  11.1 - 15.9 g/dL 14.0 14.5 13.9  Hematocrit 34.0 - 46.6 % 41.2 43.4 39.9  Platelets 150 - 450 x10E3/uL 282 257 274    Lab Results  Component Value Date/Time   VD25OH 84.4 11/12/2019 10:31 AM    Clinical ASCVD: No  The 10-year ASCVD risk score Mikey Bussing DC Jr., et al., 2013) is: 24.7%   Values used to calculate the score:     Age: 75 years     Sex: Female     Is Non-Hispanic African American: Yes     Diabetic: Yes     Tobacco smoker: No     Systolic Blood Pressure: 638 mmHg     Is BP treated: Yes     HDL Cholesterol: 59 mg/dL     Total Cholesterol: 133 mg/dL    Depression screen [facility] 2/9 12/22/2020  10/19/2020 11/12/2019  Decreased Interest 0 1 0  Down, Depressed, Hopeless 0 0 0  PHQ - 2 Score 0 1 0  Altered sleeping - - 3  Tired, decreased energy - - 0  Change in appetite - - 0  Feeling bad or failure about yourself  - - 0  Trouble concentrating - - 0  Moving slowly or fidgety/restless - - 0  Suicidal thoughts - - 0  PHQ-9 Score - - 3  Difficult doing work/chores - - Not difficult at all     Social History   Tobacco Use  Smoking Status Former   Packs/day: 0.25   Years: 12.00   Pack years: 3.00   Types: Cigarettes   Quit date: 1998   Years since quitting: 24.5  Smokeless Tobacco Never   BP Readings from Last 3 Encounters:  12/22/20 138/80  11/17/20 124/86  05/20/20 118/70   Pulse Readings from Last 3 Encounters:  12/22/20 73  11/17/20 66  05/20/20 77   Wt Readings from Last 3 Encounters:  12/22/20 178 lb 6.4 oz (80.9 kg)  11/17/20 178 lb 6.4 oz (80.9 kg)  05/20/20 190 lb 3.2 oz (86.3 kg)   BMI Readings from Last 3 Encounters:  12/22/20 29.33 kg/m  11/17/20 29.24 kg/m  05/20/20 31.17 kg/m    Assessment/Interventions: Review of patient past medical history, allergies, medications, health status, including review of consultants reports, laboratory and other test data, was performed as part of comprehensive evaluation and provision of chronic care management services.   SDOH:  (Social Determinants of Health) assessments and interventions performed: No  SDOH Screenings   Alcohol Screen: Not on file  Depression (PHQ2-9): Low Risk    PHQ-2 Score: 0  Financial Resource Strain: Low Risk    Difficulty of Paying Living Expenses: Not hard at all  Food Insecurity: No Food Insecurity   Worried About Charity fundraiser in the Last Year: Never true   Ran Out of Food in the Last Year: Never true  Housing: Not on file  Physical Activity: Inactive   Days of Exercise per Week: 0 days   Minutes of Exercise per Session: 0 min  Social Connections: Not on file   Stress: No Stress Concern Present   Feeling of Stress : Not at all  Tobacco Use: Medium Risk   Smoking Tobacco Use: Former   Smokeless Tobacco Use: Never  Transportation Needs: No Transportation Needs   Lack of Transportation (Medical): No   Lack of Transportation (Non-Medical): No    CCM Care Plan  No Known Allergies  Medications Reviewed Today     Reviewed by Lynne Logan, RN (  Registered Nurse) on 01/03/21 at [place] List Status: <None>   Medication Order Taking? Sig Documenting Provider Last Dose Status Informant  amLODipine (NORVASC) 5 MG tablet [id number] No TAKE 1 TABLET(5 MG) BY MOUTH DAILY Katherine Chard, MD Taking Active   atorvastatin (LIPITOR) 20 MG tablet [id number] No TAKE 1 TABLET BY MOUTH EVERY DAY Katherine Chard, MD Taking Active   cromolyn (OPTICROM) 4 % ophthalmic solution [id number] No Place 1 drop into both eyes 2 (two) times daily. [provider] Taking Active   escitalopram (LEXAPRO) 10 MG tablet [id number] No TAKE 1 TABLET(10 MG) BY MOUTH DAILY Katherine Chard, MD Taking Active   lisinopril-hydrochlorothiazide (ZESTORETIC) 20-12.5 MG tablet [id number] No TAKE 1 TABLET BY MOUTH DAILY Katherine Chard, MD Taking Active   Melatonin 2.5 MG CAPS [id number] No One at bedtime po Dohmeier, Asencion Partridge, MD Taking Active   mirabegron ER (MYRBETRIQ) 50 MG TB24 tablet [id number]  Take 50 mg by mouth daily. [provider]  Active   naproxen (NAPROSYN) 500 MG tablet [id number] No Take 1 tablet by mouth every 12 (twelve) hours as needed.  [provider] Taking Active   neomycin-polymyxin b-dexamethasone (MAXITROL) 3.5-10000-0.1 OINT [id number] No SMARTSIG:1 Inch(es) In Eye(s) Every Night [provider] Taking Active   nystatin (NYSTATIN) powder [id number] No Apply topically 4 (four) times daily. Katherine Chard, MD Taking Active   OXYBUTYNIN CHLORIDE PO [id number] No  [provider] Taking Active   Polyethylene Glycol 3350 (PEG 3350 PO)  [id number] No See admin instructions. [provider] Taking Active   prednisoLONE acetate (PRED FORTE) 1 % ophthalmic suspension [id number] No  [provider] Taking Active   VITAMIN D PO [id number] No Take 5,000 mg by mouth. 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   Family history of malignant neoplasm of gastrointestinal tract 10/05/2020   Personal history of colonic polyps 10/05/2020   Impairment of balance 12/18/2019   Muscle weakness 12/18/2019   Urinary frequency 10/31/2018   Prediabetes 10/31/2018   OSA on CPAP 07/22/2018   CPAP use counseling 07/22/2018   Abnormal dreams 07/22/2018   Chronic obstructive pulmonary disease ([place]) 03/21/2018   Other fatigue 03/21/2018   Snorings 03/21/2018   Nocturia more than twice per night 03/21/2018   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   Pain in right knee 01/04/2018   Overweight (BMI 25.0-29.9) 11/01/2016   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   Murmur 08/01/2016   Shortness of breath 08/01/2016   Abnormal EKG 08/01/2016   Essential hypertension 08/01/2016   Depression 08/01/2016    Immunization History  Administered Date(s) Administered   Moderna SARS-COV2 Booster Vaccination 07/05/2020   Moderna Sars-Covid-2 Vaccination 10/18/2019, 11/15/2019   PPD Test 12/05/2016   Pneumococcal Conjugate-13 10/31/2018    Conditions to be addressed/monitored:  Hypertension, Hyperlipidemia, and COPD  Care Plan : [place]  Updates made by Mayford Knife, [place] since 03/15/2021 12:00 AM     Problem: HTN, HLD, COPD   Priority: High     Long-Range Goal: Disease Management    Recent Progress: On track  Priority: High  Note:  Current Barriers:  Unable to achieve control of COPD  Does not contact provider office for questions/concerns  Pharmacist Clinical Goal(s):  Patient will achieve adherence to monitoring guidelines and medication adherence to achieve therapeutic efficacy contact provider office for questions/concerns as evidenced notation of same in electronic health record through collaboration with PharmD and provider.   Interventions: 1:1 collaboration with Katherine Chard, MD regarding development and update of comprehensive plan of care as evidenced by provider attestation and co-signature Inter-disciplinary care team collaboration (see longitudinal plan of care) Comprehensive medication review performed; medication list updated in electronic medical record  Hypertension (BP goal <140/90) -Controlled -Current treatment: Amlodipine 5 mg taking 1 tablet by mouth daily Lisinopril - Hydrochlorothiazide 20-12.5 mg taking 1 tablet daily  -Current home readings: patient reports that she is not checking her BP at home because she has not had found her old BP cuff.  -Current dietary habits:  -Current exercise habits: patient reports she is not exercising because she feels winded and has no energy  -Denies hypotensive/hypertensive symptoms -Educated on Importance of home blood pressure monitoring; Proper BP monitoring technique; Symptoms of hypotension and importance of maintaining adequate hydration; -Counseled to monitor BP at home at least three times per week, document, and provide log at future appointments -Recommended to continue current medication -CPA to follow up with patients insurance   Hyperlipidemia: (LDL goal < 70) -Controlled -Current treatment: Atorvastatin 20 mg tablet once per day  -Current dietary patterns: patient reports eating one meal per day, will discuss in further detail during next office visit.  -Current exercise  habits: she was doing silver sneakers and now her instructor is no longer there. She reports that she might start going to [employer] in [place] or the aquatic center also recommended  -Educated on Cholesterol goals;  Benefits of statin for ASCVD risk reduction; Strategies to manage statin-induced myalgias; -Recommended to continue current medication -Patient reports that she was told that her Atorvastatin medication was going to be   COPD (Goal: control symptoms and prevent exacerbations) -Uncontrolled -Current treatment - patient reports she is not taking any medication at this time.  -Medications previously tried: Spiriva, Symbicort -Gold Grade: none noted in chart at this time  -Current COPD Classification:  none noted in chart at this time -MMRC/CAT score: none noted in chart -Pulmonary function testing: is going to have one scheduled  -Exacerbations requiring treatment in last 6 months: none noted in chart -Patient denies consistent use of maintenance inhaler -She reports that she is unable to afford her inhaler and she was waiting for a referral to be sent in.  -I explained to her that the referral was sent in on 02/02/2021 and that they reported calling her on 02/10/2021 and left a voicemail, I apologized for any confusion -Contacted [facility] to schedule an appointment on 04/13/2021 at 10:00 AM and then to see Dr. Mardella Layman at 11:30 AM.  -Ms. Illingworth verbalized understanding and is willing to be seen -Frequency of rescue inhaler use: patient does not currently have a rescue inhaler -Have health concierge contact patient and do general assessment in August.  -Patient reports that she is concerned about PFT testing because she is not sure what they will find but I tried to reassure her of the importance. Collaborated with [facility] to schedule patients appointment to have her PFT testing and her appointment. -Patient to have testing completed by 04/13/2021.  -Encouraged  patient to reach out to the PCP team with any questions she might have.  Patient Goals/Self-Care Activities Patient will:  - take medications as prescribed collaborate with provider on medication access solutions  Follow Up Plan: Telephone follow up appointment with care management team member scheduled for: The patient has been provided with contact information for the care management team and has been advised to call with any health related questions or concerns.       Medication Assistance: None required.  Patient affirms current coverage meets needs.  Compliance/Adherence/Medication fill history: Care Gaps: Shingrix Vaccine COVID-19 4th dose Ophthalmology Exam Foot Exam - completed please see note from podiatry   Star-Rating Drugs: Atorvastatin 20 mg tablet Lisinopril - Hydrochlorothiazide 20-12.5 mg   Patient's preferred pharmacy is:  [facility] [place], [place] - [address] [place] [address] Phone: [phone] Fax: [phone]  Uses pill box? Yes Pt endorses 90% compliance  We discussed: Benefits of medication synchronization, packaging and delivery as well as enhanced pharmacist oversight with [employer]. Patient decided to: Continue current medication management strategy  Care Plan and Follow Up Patient Decision:  Patient agrees to Care Plan and Follow-up.  Plan: The patient has been provided with contact information for the care management team and has been advised to call with any health related questions or concerns.   Orlando Penner, PharmD Clinical Pharmacist [facility] [phone]

## 2021-03-15 NOTE — Patient Instructions (Signed)
Visit Information It was great speaking with you today!  Please let me know if you have any questions about our visit.   Goals Addressed             This Visit's Progress    Manage My Medicine       Timeframe:  Long-Range Goal Priority:  High                                   Follow Up Date: 05/18/2021   - call for medicine refill 2 or 3 days before it runs out - keep a list of all the medicines I take; vitamins and herbals too - use a pillbox to sort medicine    Why is this important?   These steps will help you keep on track with your medicines.   Notes:  Follow up with any questions that I have about my medications          Patient Care Plan: CCM Pharmacy Care Plan     Problem Identified: HTN, HLD, COPD   Priority: High     Long-Range Goal: Disease Management   Recent Progress: On track  Priority: High  Note:      Current Barriers:  Unable to achieve control of COPD  Does not contact provider office for questions/concerns  Pharmacist Clinical Goal(s):  Patient will achieve adherence to monitoring guidelines and medication adherence to achieve therapeutic efficacy contact provider office for questions/concerns as evidenced notation of same in electronic health record through collaboration with PharmD and provider.   Interventions: 1:1 collaboration with Dorothyann Peng, MD regarding development and update of comprehensive plan of care as evidenced by provider attestation and co-signature Inter-disciplinary care team collaboration (see longitudinal plan of care) Comprehensive medication review performed; medication list updated in electronic medical record  Hypertension (BP goal <140/90) -Controlled -Current treatment: Amlodipine 5 mg taking 1 tablet by mouth daily Lisinopril - Hydrochlorothiazide 20-12.5 mg taking 1 tablet daily  -Current home readings: patient reports that she is not checking her BP at home because she has not had found her old BP  cuff.  -Current dietary habits:  -Current exercise habits: patient reports she is not exercising because she feels winded and has no energy  -Denies hypotensive/hypertensive symptoms -Educated on Importance of home blood pressure monitoring; Proper BP monitoring technique; Symptoms of hypotension and importance of maintaining adequate hydration; -Counseled to monitor BP at home at least three times per week, document, and provide log at future appointments -Recommended to continue current medication -CPA to follow up with patients insurance   Hyperlipidemia: (LDL goal < 70) -Controlled -Current treatment: Atorvastatin 20 mg tablet once per day  -Current dietary patterns: patient reports eating one meal per day, will discuss in further detail during next office visit.  -Current exercise habits: she was doing silver sneakers and now her instructor is no longer there. She reports that she might start going to [employer] in [place] or the aquatic center also recommended  -Educated on Cholesterol goals;  Benefits of statin for ASCVD risk reduction; Strategies to manage statin-induced myalgias; -Recommended to continue current medication -Patient reports that she was told that her Atorvastatin medication was going to be   COPD (Goal: control symptoms and prevent exacerbations) -Uncontrolled -Current treatment - patient reports she is not taking any medication at this time.  -Medications previously tried: Spiriva, Symbicort -Gold Grade: none noted in chart at this  time  -Current COPD Classification:  none noted in chart at this time -MMRC/CAT score: none noted in chart -Pulmonary function testing: is going to have one scheduled  -Exacerbations requiring treatment in last 6 months: none noted in chart -Patient denies consistent use of maintenance inhaler -She reports that she is unable to afford her inhaler and she was waiting for a referral to be sent in.  -I explained to her that the  referral was sent in on 02/02/2021 and that they reported calling her on 02/10/2021 and left a voicemail, I apologized for any confusion -Contacted [facility] to schedule an appointment on 04/13/2021 at 10:00 AM and then to see Dr. Annett Fabian at 11:30 AM.  -Ms. Mendel verbalized understanding and is willing to be seen -Frequency of rescue inhaler use: patient does not currently have a rescue inhaler -Have health concierge contact patient and do general assessment in August.  -Patient reports that she is concerned about PFT testing because she is not sure what they will find but I tried to reassure her of the importance. Collaborated with [facility] to schedule patients appointment to have her PFT testing and her appointment. -Patient to have testing completed by 04/13/2021.  -Encouraged patient to reach out to the PCP team with any questions she might have.    Patient Goals/Self-Care Activities Patient will:  - take medications as prescribed collaborate with provider on medication access solutions  Follow Up Plan: Telephone follow up appointment with care management team member scheduled for: The patient has been provided with contact information for the care management team and has been advised to call with any health related questions or concerns.        Patient agreed to services and verbal consent obtained.   The patient verbalized understanding of instructions, educational materials, and care plan provided today and agreed to receive a mailed copy of patient instructions, educational materials, and care plan.   Cherylin Mylar, PharmD Clinical Pharmacist [facility] [phone]

## 2021-03-22 ENCOUNTER — Telehealth: Payer: Medicare Other

## 2021-03-28 ENCOUNTER — Telehealth: Payer: Self-pay

## 2021-03-28 ENCOUNTER — Telehealth: Payer: Medicare Other

## 2021-03-28 NOTE — Telephone Encounter (Signed)
  Care Management   Follow Up Note   03/28/2021 Name: Katherine Summers MRN: [id number] DOB: 07-Sep-1945   Referred by: Dorothyann Peng, MD Reason for referral : Chronic Care Management (Unsuccessful call)   An unsuccessful telephone outreach was attempted today. The patient was referred to the case management team for assistance with care management and care coordination. SW left a HIPAA compliant voice message requesting a return call.  Follow Up Plan: The care management team will reach out to the patient again over the next 30 days.   [name], BSW, CDP Social Worker, Certified Dementia Practitioner [facility] Care Management [phone]

## 2021-04-05 ENCOUNTER — Telehealth: Payer: Medicare Other

## 2021-04-05 ENCOUNTER — Telehealth: Payer: Self-pay

## 2021-04-05 NOTE — Telephone Encounter (Signed)
  Care Management   Follow Up Note   04/05/2021 Name: Katherine Summers MRN: [id number] DOB: 11-09-1945   Referred by: Dorothyann Peng, MD Reason for referral : Chronic Care Management (Unsuccessful call)   A second unsuccessful telephone outreach was attempted today. The patient was referred to the case management team for assistance with care management and care coordination. SW left a HIPAA compliant voice message requesting a return call.  Follow Up Plan: The care management team will reach out to the patient again over the next 30 days.   [name], BSW, CDP Social Worker, Certified Dementia Practitioner [facility] Care Management [phone]

## 2021-04-11 ENCOUNTER — Telehealth: Payer: Self-pay

## 2021-04-11 NOTE — Chronic Care Management (AMB) (Signed)
    Chronic Care Management Pharmacy Assistant   Name: Katherine Summers  MRN: [id number] DOB: [birth year]  Reason for Encounter: Disease State/ Hypertension  Recent office visits:  None  Recent consult visits:  None  Hospital visits:  None in previous 6 months  Medications: Outpatient Encounter Medications as of 04/11/2021  Medication Sig   amLODipine (NORVASC) 5 MG tablet TAKE 1 TABLET(5 MG) BY MOUTH DAILY   atorvastatin (LIPITOR) 20 MG tablet TAKE 1 TABLET BY MOUTH EVERY DAY   cromolyn (OPTICROM) 4 % ophthalmic solution Place 1 drop into both eyes 2 (two) times daily.   escitalopram (LEXAPRO) 10 MG tablet TAKE 1 TABLET(10 MG) BY MOUTH DAILY   lisinopril-hydrochlorothiazide (ZESTORETIC) 20-12.5 MG tablet TAKE 1 TABLET BY MOUTH DAILY   Melatonin 2.5 MG CAPS One at bedtime po   mirabegron ER (MYRBETRIQ) 50 MG TB24 tablet Take 50 mg by mouth daily.   naproxen (NAPROSYN) 500 MG tablet Take 1 tablet by mouth every 12 (twelve) hours as needed.    neomycin-polymyxin b-dexamethasone (MAXITROL) 3.5-10000-0.1 OINT SMARTSIG:1 Inch(es) In Eye(s) Every Night   nystatin (NYSTATIN) powder Apply topically 4 (four) times daily.   OXYBUTYNIN CHLORIDE PO    Polyethylene Glycol 3350 (PEG 3350 PO) See admin instructions.   prednisoLONE acetate (PRED FORTE) 1 % ophthalmic suspension    VITAMIN D PO Take 5,000 mg by mouth. Take 1 daily mon through fri   No facility-administered encounter medications on file as of 04/11/2021.   Reviewed chart prior to disease state call. Spoke with patient regarding BP  Recent Office Vitals: BP Readings from Last 3 Encounters:  12/22/20 138/80  11/17/20 124/86  05/20/20 118/70   Pulse Readings from Last 3 Encounters:  12/22/20 73  11/17/20 66  05/20/20 77    Wt Readings from Last 3 Encounters:  12/22/20 178 lb 6.4 oz (80.9 kg)  11/17/20 178 lb 6.4 oz (80.9 kg)  05/20/20 190 lb 3.2 oz (86.3 kg)     Kidney Function Lab Results  Component Value Date/Time    CREATININE 0.76 11/17/2020 10:24 AM   CREATININE 0.85 05/20/2020 09:55 AM   GFRNONAA 68 05/20/2020 09:55 AM   GFRAA 79 05/20/2020 09:55 AM    BMP Latest Ref Rng & Units 11/17/2020 05/20/2020 11/12/2019  Glucose 65 - 99 mg/dL 778(E) 423(N) 80  BUN 8 - 27 mg/dL 17 12 13   Creatinine 0.57 - 1.00 mg/dL 3.61 4.43  BUN/Creat Ratio 12 - 28 22 14 15   Sodium 134 - 144 mmol/L 137 147(H) 145(H)  Potassium 3.5 - 5.2 mmol/L 3.4(L) 3.7 3.8  Chloride 96 - 106 mmol/L 97 106 102  CO2 20 - 29 mmol/L 23 26 27   Calcium 8.7 - 10.3 mg/dL 9.7 9.8 1.54     : 1st attempt left VM on both lines 04-13-2021: 2nd attempt left VM on both lines 04-15-2021: 3rd attempt left VM on both lines  Care Gaps: Shingrix overdue Yearly foot exam overdue Covid booster overdue Yearly foot exam overdue Yearly ophthalmology exam overdue Medicare wellness 01-04-2022  Star Rating Drugs: Atorvastatin 20 mg- Last filled 03-19-2021 90 DS [facility] Lisinopril/ HCTZ 20- 12.5 mg- Last filled 03-05-2021 90 DS [facility]  Malecca [facility] CMA Clinical Pharmacist Assistant [phone]

## 2021-04-12 ENCOUNTER — Other Ambulatory Visit: Payer: Self-pay | Admitting: *Deleted

## 2021-04-12 DIAGNOSIS — J449 Chronic obstructive pulmonary disease, unspecified: Secondary | ICD-10-CM

## 2021-04-13 ENCOUNTER — Ambulatory Visit (INDEPENDENT_AMBULATORY_CARE_PROVIDER_SITE_OTHER): Payer: Medicare Other | Admitting: Pulmonary Disease

## 2021-04-13 ENCOUNTER — Other Ambulatory Visit: Payer: Self-pay

## 2021-04-13 ENCOUNTER — Encounter: Payer: Self-pay | Admitting: Pulmonary Disease

## 2021-04-13 VITALS — BP 128/74 | HR 72 | Temp 97.1°F | Ht 65.4 in | Wt 168.0 lb

## 2021-04-13 DIAGNOSIS — G4733 Obstructive sleep apnea (adult) (pediatric): Secondary | ICD-10-CM | POA: Diagnosis not present

## 2021-04-13 DIAGNOSIS — R0602 Shortness of breath: Secondary | ICD-10-CM | POA: Diagnosis not present

## 2021-04-13 DIAGNOSIS — J449 Chronic obstructive pulmonary disease, unspecified: Secondary | ICD-10-CM

## 2021-04-13 LAB — PULMONARY FUNCTION TEST
DL/VA % pred: 144 %
DL/VA: 5.88 ml/min/mmHg/L
DLCO cor % pred: 106 %
DLCO cor: 21.56 ml/min/mmHg
DLCO unc % pred: 106 %
DLCO unc: 21.56 ml/min/mmHg
RV % pred: 154 %
RV: 3.58 L
TLC % pred: 99 %
TLC: 5.23 L

## 2021-04-13 MED ORDER — ANORO ELLIPTA 62.5-25 MCG/INH IN AEPB: 1.0000 | INHALATION_SPRAY | Freq: Every day | RESPIRATORY_TRACT | 0 refills | Status: DC

## 2021-04-13 NOTE — Patient Instructions (Signed)
DLCO and PLETH performed today.

## 2021-04-13 NOTE — Patient Instructions (Addendum)
Start anoro ellipta inhaler 1 puff daily - please call us if you notice improvement in your shortness of breath and we will send in a prescription  We will schedule you for a CT chest scan to evaluate your shortness of breath  I recommend that you start to use your CPAP machine again and contact Dr. Vickey Huger for follow up.

## 2021-04-13 NOTE — Progress Notes (Signed)
Synopsis: Referred in August 2022 for COPD by Dorothyann Peng, MD  Subjective:   PATIENT ID: Katherine Summers GENDER: female DOB: 27-Jun-1946, MRN: [id number]   HPI  Chief Complaint  Patient presents with   Consult    Referred by PCP for history of COPD. States she has noticed an increase in SOB.    Jakaria Lavergne is a 75 year old woman, former smoker with hypertension, hyperlipidemia and obstructive sleep apnea who is referred to pulmonary clinic for evaluation for COPD.  She reports having shortness of breath for the last 10 years.  She reports being diagnosed with COPD around 10 to 12 years ago but does not recall having pulmonary function testing done.  She was trialed on Spiriva inhaler therapy and did not report any benefit.  She has history of obstructive sleep apnea and has a CPAP machine at home that she is not currently using.  She reports issues with the nasal mask fitting.  She is followed by Dr. Vickey Huger at [facility].  Does have shortness of breath at rest and with activity and a nonproductive cough.  She also complains of nasal congestion.  Is a former smoker and quit in the 1980s.  She was more of a social smoker.  She does report significant history of secondhand smoke as her parents smoked around her when she was growing up.  Past Medical History:  Diagnosis Date   Allergy    CKD (chronic kidney disease) stage 2, GFR 60-89 ml/min    COPD (chronic obstructive pulmonary disease) (HCC)    Depression 08/01/2016   Dizziness and giddiness    Essential hypertension 08/01/2016   Hyperlipidemia    Hypertension    Murmur 08/01/2016   Pre-diabetes    Shortness of breath 08/01/2016   Vitamin D deficiency      Family History  Problem Relation Age of Onset   Stroke Mother    Colon cancer Father    Colon cancer Sister    Stroke Maternal Grandmother    Colon cancer Paternal Grandfather      Social History   Socioeconomic History   Marital status:  Single    Spouse name: Not on file   Number of children: Not on file   Years of education: Not on file   Highest education level: Not on file  Occupational History   Occupation: retired  Tobacco Use   Smoking status: Former    Packs/day: 0.25    Years: 12.00    Pack years: 3.00    Types: Cigarettes    Quit date: 1998    Years since quitting: 24.6   Smokeless tobacco: Never  Vaping Use   Vaping Use: Never used  Substance and Sexual Activity   Alcohol use: Not Currently    Alcohol/week: 0.0 standard drinks   Drug use: No   Sexual activity: Not Currently  Other Topics Concern   Not on file  Social History Narrative   Not on file   Social Determinants of Health   Financial Resource Strain: Low Risk    Difficulty of Paying Living Expenses: Not hard at all  Food Insecurity: No Food Insecurity   Worried About Programme researcher, broadcasting/film/video in the Last Year: Never true   Ran Out of Food in the Last Year: Never true  Transportation Needs: No Transportation Needs   Lack of Transportation (Medical): No   Lack of Transportation (Non-Medical): No  Physical Activity: Inactive   Days of Exercise per Week:  0 days   Minutes of Exercise per Session: 0 min  Stress: No Stress Concern Present   Feeling of Stress : Not at all  Social Connections: Not on file  Intimate Partner Violence: Not on file     No Known Allergies   Outpatient Medications Prior to Visit  Medication Sig Dispense Refill   amLODipine (NORVASC) 5 MG tablet TAKE 1 TABLET(5 MG) BY MOUTH DAILY 90 tablet 2   atorvastatin (LIPITOR) 20 MG tablet TAKE 1 TABLET BY MOUTH EVERY DAY 90 tablet 2   cromolyn (OPTICROM) 4 % ophthalmic solution Place 1 drop into both eyes 2 (two) times daily.     escitalopram (LEXAPRO) 10 MG tablet TAKE 1 TABLET(10 MG) BY MOUTH DAILY 90 tablet 1   lisinopril-hydrochlorothiazide (ZESTORETIC) 20-12.5 MG tablet TAKE 1 TABLET BY MOUTH DAILY 90 tablet 2   Melatonin 2.5 MG CAPS One at bedtime po 30 capsule 0    naproxen (NAPROSYN) 500 MG tablet Take 1 tablet by mouth every 12 (twelve) hours as needed.      neomycin-polymyxin b-dexamethasone (MAXITROL) 3.5-10000-0.1 OINT SMARTSIG:1 Inch(es) In Eye(s) Every Night     nystatin (NYSTATIN) powder Apply topically 4 (four) times daily. 15 g 0   OXYBUTYNIN CHLORIDE PO      Polyethylene Glycol 3350 (PEG 3350 PO) See admin instructions.     prednisoLONE acetate (PRED FORTE) 1 % ophthalmic suspension      VITAMIN D PO Take 5,000 mg by mouth. Take 1 daily mon through fri     mirabegron ER (MYRBETRIQ) 50 MG TB24 tablet Take 50 mg by mouth daily.     No facility-administered medications prior to visit.    Review of Systems  Constitutional:  Negative for chills, fever, malaise/fatigue and weight loss.  HENT:  Positive for congestion. Negative for sinus pain and sore throat.   Eyes: Negative.   Respiratory:  Positive for cough and shortness of breath. Negative for hemoptysis, sputum production and wheezing.   Cardiovascular:  Negative for chest pain, palpitations, orthopnea, claudication and leg swelling.  Gastrointestinal:  Negative for abdominal pain, heartburn, nausea and vomiting.  Genitourinary: Negative.   Musculoskeletal:  Negative for joint pain and myalgias.  Skin:  Negative for rash.  Neurological:  Negative for weakness.  Endo/Heme/Allergies: Negative.   Psychiatric/Behavioral:  Positive for depression.    Objective:   Vitals:   04/13/21 1115  BP: 128/74  Pulse: 72  Temp: (!) 97.1 F (36.2 C)  TempSrc: Oral  SpO2: 98%  Weight: 168 lb (76.2 kg)  Height: 5' 5.4" (1.661 m)   Physical Exam Constitutional:      General: She is not in acute distress.    Appearance: She is not ill-appearing.  HENT:     Head: Normocephalic and atraumatic.  Eyes:     General: No scleral icterus.    Conjunctiva/sclera: Conjunctivae normal.     Pupils: Pupils are equal, round, and reactive to light.  Cardiovascular:     Rate and Rhythm: Normal rate and  regular rhythm.     Pulses: Normal pulses.     Heart sounds: Normal heart sounds. No murmur heard. Pulmonary:     Effort: Pulmonary effort is normal.     Breath sounds: Normal breath sounds. No wheezing, rhonchi or rales.  Abdominal:     General: Bowel sounds are normal.     Palpations: Abdomen is soft.  Musculoskeletal:     Right lower leg: No edema.     Left lower leg: No  edema.  Lymphadenopathy:     Cervical: No cervical adenopathy.  Skin:    General: Skin is warm and dry.  Neurological:     General: No focal deficit present.     Mental Status: She is alert.  Psychiatric:        Mood and Affect: Mood normal.        Behavior: Behavior normal.        Thought Content: Thought content normal.        Judgment: Judgment normal.    CBC    Component Value Date/Time   WBC 10.8 11/17/2020 1024   WBC 8.2 02/24/2018 1512   RBC 4.86 11/17/2020 1024   RBC 4.67 02/24/2018 1512   HGB 14.0 11/17/2020 1024   HCT 41.2 11/17/2020 1024   PLT 282 11/17/2020 1024   MCV 85 11/17/2020 1024   MCH 28.8 11/17/2020 1024   MCH 29.3 02/24/2018 1512   MCHC 34.0 11/17/2020 1024   MCHC 34.4 02/24/2018 1512   RDW 12.9 11/17/2020 1024   LYMPHSABS 1.8 02/24/2018 1512   MONOABS 0.6 02/24/2018 1512   EOSABS 0.1 02/24/2018 1512   BASOSABS 0.0 02/24/2018 1512   BMP Latest Ref Rng & Units 11/17/2020 05/20/2020 11/12/2019  Glucose 65 - 99 mg/dL 161(W106(H) 960(A114(H) 80  BUN 8 - 27 mg/dL 17 12 13   Creatinine 0.57 - 1.00 mg/dL 5.400.76 9.810.85 1.910.87  BUN/Creat Ratio 12 - 28 22 14 15   Sodium 134 - 144 mmol/L 137 147(H) 145(H)  Potassium 3.5 - 5.2 mmol/L 3.4(L) 3.7 3.8  Chloride 96 - 106 mmol/L 97 106 102  CO2 20 - 29 mmol/L 23 26 27   Calcium 8.7 - 10.3 mg/dL 9.7 9.8 47.810.1   Chest imaging: CXR 2019 Heart size is normal. Atherosclerosis and tortuosity of the aorta. 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 No sign of active infiltrate, mass, effusion or  collapse. Ordinary degenerative changes affect the spine.  PFT: PFT Results Latest Ref Rng & Units 04/13/2021  DLCO uncorrected ml/min/mmHg 21.56  DLCO UNC% % 106  DLCO corrected ml/min/mmHg 21.56  DLCO COR %Predicted % 106  DLVA Predicted % 144  TLC L 5.23  TLC % Predicted % 99  RV % Predicted % 154   Echo 2017: - LVEF 65-70%, moderate LVH, normal wall motion, diastolic    dysfunction, indeterminate LV filling pressure, trivial MR,    normal LA size, mild TR, rVSP 38 mmHg, normal IVC.    Assessment & Plan:   Shortness of breath - Plan: CT Chest High Resolution  Obstructive sleep apnea  Discussion: Ernestine ConradBeverly Rask is a 75 year old woman, former smoker with hypertension, hyperlipidemia and obstructive sleep apnea who is referred to pulmonary clinic for evaluation for COPD.  She had pulmonary function testing done today she was unable to exhale for the adequate amount of time to accurately assess her lung function.  Given her smoking history and secondhand smoking history she is at risk for obstructive lung disease.  We will trial her on Anoro Ellipta 1 puff daily and monitor for any benefit in her symptoms.  We will schedule her for CT chest scan for further evaluation of her shortness of breath.  Have recommended that she follow-up with Dr. Vickey Hugerohmeier regarding her obstructive sleep apnea and resuming her use of the CPAP machine.  Follow-up in 3 months.  Melody ComasJonathan Yaneli Keithley, MD [facility] Office: [phone]   Current Outpatient Medications:  amLODipine (NORVASC) 5 MG tablet, TAKE 1 TABLET(5 MG) BY MOUTH DAILY, Disp: 90 tablet, Rfl: 2   atorvastatin (LIPITOR) 20 MG tablet, TAKE 1 TABLET BY MOUTH EVERY DAY, Disp: 90 tablet, Rfl: 2   cromolyn (OPTICROM) 4 % ophthalmic solution, Place 1 drop into both eyes 2 (two) times daily., Disp: , Rfl:    escitalopram (LEXAPRO) 10 MG tablet, TAKE 1 TABLET(10 MG) BY MOUTH DAILY, Disp: 90 tablet, Rfl: 1    lisinopril-hydrochlorothiazide (ZESTORETIC) 20-12.5 MG tablet, TAKE 1 TABLET BY MOUTH DAILY, Disp: 90 tablet, Rfl: 2   Melatonin 2.5 MG CAPS, One at bedtime po, Disp: 30 capsule, Rfl: 0   naproxen (NAPROSYN) 500 MG tablet, Take 1 tablet by mouth every 12 (twelve) hours as needed. , Disp: , Rfl:    neomycin-polymyxin b-dexamethasone (MAXITROL) 3.5-10000-0.1 OINT, SMARTSIG:1 Inch(es) In Eye(s) Every Night, Disp: , Rfl:    nystatin (NYSTATIN) powder, Apply topically 4 (four) times daily., Disp: 15 g, Rfl: 0   OXYBUTYNIN CHLORIDE PO, , Disp: , Rfl:    Polyethylene Glycol 3350 (PEG 3350 PO), See admin instructions., Disp: , Rfl:    prednisoLONE acetate (PRED FORTE) 1 % ophthalmic suspension, , Disp: , Rfl:    umeclidinium-vilanterol (ANORO ELLIPTA) 62.5-25 MCG/INH AEPB, Inhale 1 puff into the lungs daily., Disp: 2 each, Rfl: 0   VITAMIN D PO, Take 5,000 mg by mouth. Take 1 daily mon through fri, Disp: , Rfl:

## 2021-04-13 NOTE — Progress Notes (Signed)
Attempted to perform full PFT. Patient was only able to perform DLCO and Pleth. Patient was not able to blow out long enough without immediately breathing back in for spirometry.

## 2021-04-13 NOTE — Progress Notes (Signed)
Patient seen in the office today and instructed on use of Anoro.  Patient expressed understanding and demonstrated technique.  Katherine Summers [facility] 04/13/21

## 2021-04-14 ENCOUNTER — Encounter: Payer: Self-pay | Admitting: Pulmonary Disease

## 2021-04-14 ENCOUNTER — Telehealth: Payer: Medicare Other

## 2021-04-14 ENCOUNTER — Ambulatory Visit (INDEPENDENT_AMBULATORY_CARE_PROVIDER_SITE_OTHER): Payer: Medicare Other

## 2021-04-14 DIAGNOSIS — M79671 Pain in right foot: Secondary | ICD-10-CM

## 2021-04-14 DIAGNOSIS — J449 Chronic obstructive pulmonary disease, unspecified: Secondary | ICD-10-CM

## 2021-04-14 DIAGNOSIS — M79672 Pain in left foot: Secondary | ICD-10-CM

## 2021-04-14 DIAGNOSIS — R7303 Prediabetes: Secondary | ICD-10-CM

## 2021-04-14 DIAGNOSIS — N3281 Overactive bladder: Secondary | ICD-10-CM

## 2021-04-14 DIAGNOSIS — I10 Essential (primary) hypertension: Secondary | ICD-10-CM

## 2021-04-19 ENCOUNTER — Other Ambulatory Visit: Payer: Medicare Other

## 2021-04-21 NOTE — Patient Instructions (Signed)
Goals Addressed       Patient Stated     Manage urinary frequency (pt-stated)   On track     Timeframe:  Long-Range Goal Priority:  Medium Start Date:  09/21/20                           Expected End Date: 09/21/21   Follow up Date: 07/15/21  f/u with PCP for further evaluation and treatment of over active bladder  call PCP for new or worsening symptoms or concerns related to this condition take prescription medications exactly as prescribed by MD                    Other     COPD disease progression minimized or managed   On track     Timeframe:  Long-Range Goal Priority:  High Start Date:  04/14/21                           Expected End Date:  04/14/22  Next scheduled follow up: 07/15/21            Patient Goals: - do breathing exercises at least 2 times each day - develop a rescue plan - eliminate symptom triggers at home - follow rescue plan if symptoms flare-up - keep follow-up appointments - identify and remove indoor air pollutants - limit outdoor activity during cold weather                Minimize DM disease progression   On track     Timeframe:  Long-Range Goal Priority:  Medium Start Date:  09/21/20                           Expected End Date: 09/21/21  Follow up date: 07/15/21  Self administers oral medications as prescribed Attends all scheduled provider appointments Adheres to prescribed ADA/carb modified Review and discuss printed DM educational materials at next RN CM f/u call  exercise 3-5 days/week                            Track and Manage My Blood Pressure-Hypertension   On track     Timeframe:  Long-Range Goal Priority:  Medium Start Date: 09/21/20                            Expected End Date: 09/21/21                     Follow Up Date: 07/15/21   Patient Goals/Self-Care Activities Self administers medications as prescribed Attends all scheduled provider appointments Calls provider office for new concerns, questions, or BP outside discussed  parameters Checks BP and records as discussed Follows a low sodium diet/DASH diet  Why is this important?   You won't feel high blood pressure, but it can still hurt your blood vessels.  High blood pressure can cause heart or kidney problems. It can also cause a stroke.  Making lifestyle changes like losing a Dawana Asper weight or eating less salt will help.  Checking your blood pressure at home and at different times of the day can help to control blood pressure.  If the doctor prescribes medicine remember to take it the way the doctor ordered.  Call the office if you cannot  afford the medicine or if there are questions about it.     Notes:

## 2021-04-21 NOTE — Chronic Care Management (AMB) (Signed)
Chronic Care Management   CCM RN Visit Note  04/14/2021 Name: Katherine Summers MRN: [id number] DOB: 1946-02-20  Subjective: Katherine Summers is a 76 y.o. year old female who is a primary care patient of Glendale Chard, MD. The care management team was consulted for assistance with disease management and care coordination needs.    Engaged with patient by telephone for follow up visit in response to provider referral for case management and/or care coordination services.   Consent to Services:  The patient was given information about Chronic Care Management services, agreed to services, and gave verbal consent prior to initiation of services.  Please see initial visit note for detailed documentation.   Patient agreed to services and verbal consent obtained.   Assessment: Review of patient past medical history, allergies, medications, health status, including review of consultants reports, laboratory and other test data, was performed as part of comprehensive evaluation and provision of chronic care management services.   SDOH (Social Determinants of Health) assessments and interventions performed: Yes, no acute challenges    CCM Care Plan  No Known Allergies  Outpatient Encounter Medications as of 04/14/2021  Medication Sig   amLODipine (NORVASC) 5 MG tablet TAKE 1 TABLET(5 MG) BY MOUTH DAILY   atorvastatin (LIPITOR) 20 MG tablet TAKE 1 TABLET BY MOUTH EVERY DAY   cromolyn (OPTICROM) 4 % ophthalmic solution Place 1 drop into both eyes 2 (two) times daily.   escitalopram (LEXAPRO) 10 MG tablet TAKE 1 TABLET(10 MG) BY MOUTH DAILY   lisinopril-hydrochlorothiazide (ZESTORETIC) 20-12.5 MG tablet TAKE 1 TABLET BY MOUTH DAILY   Melatonin 2.5 MG CAPS One at bedtime po   naproxen (NAPROSYN) 500 MG tablet Take 1 tablet by mouth every 12 (twelve) hours as needed.    neomycin-polymyxin b-dexamethasone (MAXITROL) 3.5-10000-0.1 OINT SMARTSIG:1 Inch(es) In Eye(s) Every Night   nystatin (NYSTATIN) powder  Apply topically 4 (four) times daily.   OXYBUTYNIN CHLORIDE PO    Polyethylene Glycol 3350 (PEG 3350 PO) See admin instructions.   prednisoLONE acetate (PRED FORTE) 1 % ophthalmic suspension    umeclidinium-vilanterol (ANORO ELLIPTA) 62.5-25 MCG/INH AEPB Inhale 1 puff into the lungs daily.   VITAMIN D PO Take 5,000 mg by mouth. Take 1 daily mon through fri   No facility-administered encounter medications on file as of 04/14/2021.    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   Family history of malignant neoplasm of gastrointestinal tract 10/05/2020   Personal history of colonic polyps 10/05/2020   Impairment of balance 12/18/2019   Muscle weakness 12/18/2019   Urinary frequency 10/31/2018   Prediabetes 10/31/2018   OSA on CPAP 07/22/2018   CPAP use counseling 07/22/2018   Abnormal dreams 07/22/2018   Chronic obstructive pulmonary disease ([place]) 03/21/2018   Other fatigue 03/21/2018   Snorings 03/21/2018   Nocturia more than twice per night 03/21/2018   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   Pain in right knee 01/04/2018   Overweight (BMI 25.0-29.9) 11/01/2016   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   Murmur 08/01/2016   Shortness of breath 08/01/2016   Abnormal EKG 08/01/2016   Essential hypertension 08/01/2016   Depression 08/01/2016    Conditions to be addressed/monitored: HTN, COPD, Prediabetes, Overactive bladder, Foot pain, bilateral      Patient Care Plan: Prediabetes   Long-Range Goal: Disease Progression Prevented  or Minimized   Start Date: 09/21/2020  Expected End Date: 09/21/2021  Recent Progress: On track  Priority: Medium  Note:   Objective:  Lab Results  Component Value Date   HGBA1C 5.8 (H) 11/17/2020   Lab Results  Component Value Date   CREATININE 0.76  11/17/2020   CREATININE 0.85 05/20/2020   CREATININE 0.87 11/12/2019   Lab Results  Component Value Date   EGFR 83 11/17/2020  Current Barriers:  Knowledge Deficits related to basic Diabetes pathophysiology and self care/management Knowledge Deficits related to medications used for management of diabetes Does not have glucometer to monitor blood sugar Case Manager Clinical Goal(s):  Patient will demonstrate improved adherence to prescribed treatment plan for diabetes self care/management as evidenced by:  adherence to ADA/ carb modified diet exercise 3-5 days/week Interventions:  04/14/21 completed successful outbound call with patient  Collaboration with Glendale Chard, MD regarding development and update of comprehensive plan of care as evidenced by provider attestation and co-signature Inter-disciplinary care team collaboration (see longitudinal plan of care) Provided education to patient about basic DM disease process Review of patient status, including review of consultants reports, relevant laboratory and other test results, and medications completed. Reviewed medications with patient and discussed importance of medication adherence Educated on dietary and exercise recommendations, Educated on 15'15' rule; daily glycemic goal  Mailed printed educational material related to Low Carb Smoothies Discussed plans with patient for ongoing care management follow up and provided patient with direct contact information for care management team Patient Goals/Self-Care Activities Self administers oral medications as prescribed Attends all scheduled provider appointments Adheres to prescribed ADA/carb modified Review and discuss printed DM educational materials at next RN CM f/u call exercise 3-5 days/week   Follow Up Plan: Telephone follow up appointment with care management team member scheduled for: 07/15/21    Patient Care Plan: Hypertension (Adult)     Problem Identified: Disease  Progression (Hypertension)   Priority: Medium     Long-Range Goal: Disease Progression Prevented or Minimized   Start Date: 09/21/2020  Expected End Date: 09/21/2021  Recent Progress: On track  Priority: Medium  Note:   Objective:  Last practice recorded BP readings:  BP Readings from Last 3 Encounters:  04/13/21 128/74  12/22/20 138/80  11/17/20 124/86   Most recent eGFR/CrCl:  Lab Results  Component Value Date   EGFR 83 11/17/2020    No components found for: CRCL Current Barriers:  Knowledge Deficits related to basic understanding of hypertension pathophysiology and self care management Knowledge Deficits related to understanding of medications prescribed for management of hypertension Case Manager Clinical Goal(s):  patient will demonstrate improved adherence to prescribed treatment plan for hypertension as evidenced by taking all medications as prescribed, monitoring and recording blood pressure as directed, adhering to low sodium/DASH diet Interventions:  04/14/21 completed successful outbound call with patient  Collaboration with Glendale Chard, MD regarding development and update of comprehensive plan of care as evidenced by provider attestation and co-signature Inter-disciplinary care team collaboration (see longitudinal plan of care) Evaluation of current treatment plan related to hypertension self management and patient's adherence to plan as established by provider. Reviewed medications with patient and discussed importance of compliance Educated on target BP <130/80; Educated on dietary and exercise recommendations, including recommendations to follow a low Sodium diet  Advised patient, providing education and rationale, to monitor blood pressure daily and record, calling PCP for findings outside established parameters  Discussed plans with patient for ongoing care management follow up and provided patient  with direct contact information for care management team Patient  Goals/Self-Care Activities Self administers medications as prescribed Attends all scheduled provider appointments Calls provider office for new concerns, questions, or BP outside discussed parameters Checks BP and records as discussed Follows a low sodium diet/DASH diet  Follow Up Plan: Telephone follow up appointment with care management team member scheduled for: 07/15/21    Patient Care Plan: Bladder dysfunction     Problem Identified: Bladder dysfunction   Priority: Medium     Long-Range Goal: Manage urinary frequency   Start Date: 09/21/2020  Expected End Date: 09/21/2021  This Visit's Progress: Not on track  Recent Progress: On track  Priority: Medium  Note:   Current Barriers:  Ineffective Self Health Maintenance Currently UNABLE TO independently self manage needs related to chronic health conditions.  Knowledge Deficits related to short term plan for care coordination needs and long term plans for chronic disease management needs Case Manager Clinical Goal(s):  Patient will work with care management team to address care coordination and chronic disease management needs related to Disease Management Educational Needs Care Coordination Medication Management and Education Psychosocial Support   Interventions:  04/14/21 completed successful outbound call with patient  Collaboration with Glendale Chard, MD regarding development and update of comprehensive plan of care as evidenced by provider attestation and co-signature Inter-disciplinary care team collaboration (see longitudinal plan of care) Provided education to patient about basic disease process related to Impaired Urinary Elimination  Review of patient status, including review of consultants reports, relevant laboratory and other test results, and medications completed. Reviewed medications with patient and discussed importance of medication adherence Determined patient missed her follow up appointment with PCP follow  up with Dr. Baird Cancer is scheduled for 02/01/21 $RemoveBef'@9'jBYdYiohnV$ :15 AM to f/u on urinary status with use of Myrbetriq Discussed patient will reschedule this appointment due to still experiencing symptoms not relived by Myrbetriq  Discussed plans with patient for ongoing care management follow up and provided patient with direct contact information for care management team Patient Goals/Self-Care Activities:  f/u with PCP for further evaluation and treatment of over active bladder  call PCP for new or worsening symptoms or concerns related to this condition take prescription medications exactly as prescribed by MD   Follow Up Plan: Telephone follow up appointment with care management team member scheduled for: 07/15/21     Patient Care Plan: COPD (Adult)     Problem Identified: Disease Progression (COPD)   Priority: High     Long-Range Goal: Disease Progression Minimized or Managed   Start Date: 04/14/2021  Expected End Date: 04/14/2022  This Visit's Progress: On track  Priority: High  Note:   Current Barriers:  Knowledge deficits related to basic understanding of COPD disease process Knowledge deficits related to basic COPD self care/management Knowledge deficit related to basic understanding of how to use inhalers and how inhaled medications work Knowledge deficit related to importance of energy conservation Case Manager Clinical Goal(s): patient will report utilizing pursed lip breathing for shortness of breath patient will engage in lite exercise as tolerated to build/regain stamina and strength and reduce shortness of breath through activity tolerance patient will verbalize basic understanding of COPD disease process and self care activities  Interventions:  04/14/21 completed successful outbound call with patient  Collaboration with Glendale Chard, MD regarding development and update of comprehensive plan of care as evidenced by provider attestation and co-signature Inter-disciplinary care team  collaboration (see longitudinal plan of care) Provided patient with basic written and verbal  COPD education on self care/management/and exacerbation prevention  Provided patient with COPD action plan and reinforced importance of daily self assessment Discussed Pulmonary Rehab and offered to assist with referral placement Provided written and verbal instructions on pursed lip breathing and utilized returned demonstration as teach back Provided instruction about proper use of medications used for management of COPD including inhalers Advised patient to self assesses COPD action plan zone and make appointment with provider if in the yellow zone for 48 hours without improvement. Provided patient with education about the role of exercise in the management of COPD Advised patient to engage in light exercise as tolerated 3-5 days a week Provided education about and advised patient to utilize infection prevention strategies to reduce risk of respiratory infection  Discussed plans with patient for ongoing care management follow up and provided patient with direct contact information for care management team Self-Care Activities:  Self administers medications as prescribed Attends all scheduled provider appointments Calls pharmacy for medication refills Calls provider office for new concerns or questions Patient Goals: - do breathing exercises at least 2 times each day - develop a rescue plan - eliminate symptom triggers at home - follow rescue plan if symptoms flare-up - keep follow-up appointments - identify and remove indoor air pollutants - limit outdoor activity during cold weather  Follow Up Plan: Telephone follow up appointment with care management team member scheduled for: 07/15/21    Plan:Telephone follow up appointment with care management team member scheduled for:  07/15/21  Barb Merino, RN, BSN, CCM Care Management Coordinator [facility]   Direct Phone: [phone]

## 2021-05-03 ENCOUNTER — Ambulatory Visit: Payer: Medicare Other

## 2021-05-03 DIAGNOSIS — J449 Chronic obstructive pulmonary disease, unspecified: Secondary | ICD-10-CM

## 2021-05-03 DIAGNOSIS — R7303 Prediabetes: Secondary | ICD-10-CM

## 2021-05-03 DIAGNOSIS — I10 Essential (primary) hypertension: Secondary | ICD-10-CM

## 2021-05-03 NOTE — Chronic Care Management (AMB) (Signed)
Chronic Care Management    Social Work Note  05/03/2021 Name: Katherine Summers MRN: [id number] DOB: 01/25/1946  Katherine Summers is a 75 y.o. year old female who is a primary care patient of Dorothyann Peng, MD. The CCM team was consulted to assist the patient with chronic disease management and/or care coordination needs related to:  HTN, COPD, and Prediabetes .   Engaged with patient by telephone for follow up visit in response to provider referral for social work chronic care management and care coordination services.   Consent to Services:  The patient was given information about Chronic Care Management services, agreed to services, and gave verbal consent prior to initiation of services.  Please see initial visit note for detailed documentation.   Patient agreed to services and consent obtained.   Assessment: Review of patient past medical history, allergies, medications, and health status, including review of relevant consultants reports was performed today as part of a comprehensive evaluation and provision of chronic care management and care coordination services.     SDOH (Social Determinants of Health) assessments and interventions performed:    Advanced Directives Status: Not addressed in this encounter.  CCM Care Plan  No Known Allergies  Outpatient Encounter Medications as of 05/03/2021  Medication Sig   amLODipine (NORVASC) 5 MG tablet TAKE 1 TABLET(5 MG) BY MOUTH DAILY   atorvastatin (LIPITOR) 20 MG tablet TAKE 1 TABLET BY MOUTH EVERY DAY   cromolyn (OPTICROM) 4 % ophthalmic solution Place 1 drop into both eyes 2 (two) times daily.   escitalopram (LEXAPRO) 10 MG tablet TAKE 1 TABLET(10 MG) BY MOUTH DAILY   lisinopril-hydrochlorothiazide (ZESTORETIC) 20-12.5 MG tablet TAKE 1 TABLET BY MOUTH DAILY   Melatonin 2.5 MG CAPS One at bedtime po   naproxen (NAPROSYN) 500 MG tablet Take 1 tablet by mouth every 12 (twelve) hours as needed.    neomycin-polymyxin b-dexamethasone  (MAXITROL) 3.5-10000-0.1 OINT SMARTSIG:1 Inch(es) In Eye(s) Every Night   nystatin (NYSTATIN) powder Apply topically 4 (four) times daily.   OXYBUTYNIN CHLORIDE PO    Polyethylene Glycol 3350 (PEG 3350 PO) See admin instructions.   prednisoLONE acetate (PRED FORTE) 1 % ophthalmic suspension    umeclidinium-vilanterol (ANORO ELLIPTA) 62.5-25 MCG/INH AEPB Inhale 1 puff into the lungs daily.   VITAMIN D PO Take 5,000 mg by mouth. Take 1 daily mon through fri   No facility-administered encounter medications on file as of 05/03/2021.    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   Family history of malignant neoplasm of gastrointestinal tract 10/05/2020   Personal history of colonic polyps 10/05/2020   Impairment of balance 12/18/2019   Muscle weakness 12/18/2019   Urinary frequency 10/31/2018   Prediabetes 10/31/2018   OSA on CPAP 07/22/2018   CPAP use counseling 07/22/2018   Abnormal dreams 07/22/2018   Chronic obstructive pulmonary disease ([facility]) 03/21/2018   Other fatigue 03/21/2018   Snorings 03/21/2018   Nocturia more than twice per night 03/21/2018   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   Pain in right knee 01/04/2018   Overweight (BMI 25.0-29.9) 11/01/2016   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   Murmur 08/01/2016   Shortness of breath 08/01/2016   Abnormal EKG 08/01/2016   Essential hypertension 08/01/2016   Depression 08/01/2016    Conditions to be addressed/monitored: HTN, COPD, and Prediabetes  Care Plan : Social Work [facility] Care Plan  Updates made by [name] since 05/03/2021 12:00 AM     Problem: Barriers to Treatment      Long-Range Goal: Barriers to Treatment Identified and Managed   Start Date: 05/03/2021  Expected End Date: 08/01/2021  Priority: Medium   Note:   Current Barriers:  Chronic disease management support and education needs related to HTN, COPD, and Prediabetes   Does not contact provider office for questions/concerns  Social Worker Clinical Goal(s):  patient will work with SW to identify and address any acute and/or chronic care coordination needs related to the self health management of HTN, COPD, and Prediabetes   Patient will attend Chest CT appointment as ordered by her pulmonologist   SW Interventions:  Inter-disciplinary care team collaboration (see longitudinal plan of care) Collaboration with Dorothyann Peng, MD regarding development and update of comprehensive plan of care as evidenced by provider attestation and co-signature Successful outbound call placed  to assess for care coordination needs Patient reports her legs are painful and have been painful since she traveled in a car to  - patient reports she rode in the back seat and feels she did not stretch enough Attempted to schedule the patient an appointment to be seen by her provider, patient declined stating "no because when I go they don't do nothing" Advised the patient the provider can assess for causes of leg cramps and prescribe medications and of therapy if indicated - patient declined stating she knows this would improve if she got up and moved around more but she doesn't feel like it Performed chart review to note upcomming chest CT scheduled for Thursday 9/1 Assessed for transportation barriers - patient denies barriers at this time  Patient states she may not attend CT scan as she is afraid what results may be and she is not sure why it is needed Reviewed recent Pulmonology visit notes with patient reviewing the CT was ordered due to patients shortness of breath Assessed if patient is still utilizing inhaler as prescribed - patient stated she is not using the inhaler due to her inability to inhale medication Advised the patient SW would  collaborate with RN Care Manager to inform of above patient concerns for follow up - patient stated understanding Collaboration with RN Care Manager by phone to discuss patient concerns - RN Care Manager will contact the patient over the next 10 days Scheduled follow up call over the next 21 days  Patient Goals/Self-Care Activities patient will:   -  Attend Chest CT appointment -Engage with RN Care Manager -Contact SW as needed prior to next scheduled call  Follow Up Plan: The care management team will reach out to the patient again over the next 21 days.        Follow Up Plan: SW will follow up with patient by phone over the next 21 days      [name], BSW, CDP Social Worker, Certified Dementia Practitioner [facility] Care Management [phone]

## 2021-05-03 NOTE — Patient Instructions (Signed)
Social Worker Visit Information  Goals we discussed today:   Goals Addressed             This Visit's Progress    Barriers to Treatment Identified and Managed       Timeframe:  Long-Range Goal Priority:  Medium Start Date:   8.30.22                          Expected End Date: 11.28.22                      Next planned outreach: 9.12.22  Patient Goals/Self-Care Activities patient will:   - Attend Chest CT appointment -Engage with RN Care Manager -Contact SW as needed prior to next scheduled call         Follow Up Plan: SW will follow up with patient by phone over the next 14 days   [name], BSW, CDP Social Worker, Certified Dementia Practitioner [facility] Care Management [phone]

## 2021-05-04 DIAGNOSIS — J449 Chronic obstructive pulmonary disease, unspecified: Secondary | ICD-10-CM

## 2021-05-04 DIAGNOSIS — I10 Essential (primary) hypertension: Secondary | ICD-10-CM

## 2021-05-05 ENCOUNTER — Ambulatory Visit (INDEPENDENT_AMBULATORY_CARE_PROVIDER_SITE_OTHER)
Admission: RE | Admit: 2021-05-05 | Discharge: 2021-05-05 | Disposition: A | Discharge status: Home / Self Care | Payer: Medicare Other | Source: Ambulatory Visit | Attending: Pulmonary Disease | Admitting: Pulmonary Disease

## 2021-05-05 ENCOUNTER — Other Ambulatory Visit: Payer: Self-pay

## 2021-05-05 DIAGNOSIS — R0602 Shortness of breath: Secondary | ICD-10-CM | POA: Diagnosis not present

## 2021-05-10 ENCOUNTER — Telehealth: Payer: Medicare Other

## 2021-05-11 NOTE — Progress Notes (Signed)
I called the patient and left a message for the patient to call back for results.

## 2021-05-16 ENCOUNTER — Telehealth: Payer: Medicare Other

## 2021-05-16 ENCOUNTER — Telehealth: Payer: Self-pay

## 2021-05-16 NOTE — Telephone Encounter (Signed)
  Care Management   Follow Up Note   05/16/2021 Name: Katherine Summers MRN: [id number] DOB: [birth year]   Referred by: Dorothyann Peng, MD Reason for referral : Chronic Care Management (Unsuccessful call)   An unsuccessful telephone outreach was attempted today. The patient was referred to the case management team for assistance with care management and care coordination. SW left a HIPAA compliant voice message requesting a return call.  Follow Up Plan: The care management team will reach out to the patient again over the next 21 days.   [name], BSW, CDP Social Worker, Certified Dementia Practitioner [facility] Care Management [phone]

## 2021-05-17 ENCOUNTER — Telehealth: Payer: Self-pay | Admitting: Pulmonary Disease

## 2021-05-17 ENCOUNTER — Telehealth: Payer: Self-pay

## 2021-05-17 ENCOUNTER — Ambulatory Visit (INDEPENDENT_AMBULATORY_CARE_PROVIDER_SITE_OTHER): Payer: Medicare Other

## 2021-05-17 DIAGNOSIS — I10 Essential (primary) hypertension: Secondary | ICD-10-CM

## 2021-05-17 DIAGNOSIS — J449 Chronic obstructive pulmonary disease, unspecified: Secondary | ICD-10-CM

## 2021-05-17 DIAGNOSIS — R7303 Prediabetes: Secondary | ICD-10-CM

## 2021-05-17 NOTE — Chronic Care Management (AMB) (Signed)
    Called Katherine Summers, No answer, left message of appointment on 05-18-2021 at 2:00 via telephone visit with Cherylin Mylar, Pharm D. Notified to have all medications, supplements, blood pressure and/or blood sugar logs available during appointment and to return call if need to reschedule.   Care Gaps: Shingrix overdue Yearly foot exam overdue Covid booster overdue Yearly foot exam overdue Yearly ophthalmology exam overdue Medicare wellness 01-04-2022  Star Rating Drug: Atorvastatin 20 mg- Last filled 03-19-2021 90 DS [facility] Lisinopril/ HCTZ 20- 12.5 mg- Last filled 03-05-2021 90 DS [facility]  Any gaps in medications fill history? No  Huey Romans [facility] Clinical Pharmacist Assistant [phone]

## 2021-05-17 NOTE — Telephone Encounter (Signed)
Called and spoke with Patient.  Dr. Lanora Manis recommendations given. Patient stated understanding.  Patient is calling [facility] to find out inhaler coverage and will call back with formulary.  Will leave message open until Patient calls back.

## 2021-05-17 NOTE — Patient Instructions (Signed)
Social Worker Visit Information  Goals we discussed today:   Goals Addressed             This Visit's Progress    Barriers to Treatment Identified and Managed   On track    Timeframe:  Long-Range Goal Priority:  Medium Start Date:   8.30.22                          Expected End Date: 11.28.22                      Next planned outreach: 10.3.22  Patient Goals/Self-Care Activities patient will:   - Engage with [facility] to obtain CT imaging results -Contact primary care provider to schedule an office visit as needed -Contact SW as needed prior to next scheduled call         Follow Up Plan: SW will follow up with patient by phone over the next month   [name], BSW, CDP Social Worker, Certified Dementia Practitioner [facility] Care Management [phone]

## 2021-05-17 NOTE — Chronic Care Management (AMB) (Signed)
Chronic Care Management    Social Work Note  05/17/2021 Name: Katherine Summers MRN: [id number] DOB: Aug 20, 1946  Katherine Summers is a 75 y.o. year old female who is a primary care patient of Dorothyann Peng, MD. The CCM team was consulted to assist the patient with chronic disease management and/or care coordination needs related to:  HTN, COPD, and Prediabetes .   Engaged with patient by telephone for follow up visit in response to provider referral for social work chronic care management and care coordination services.   Consent to Services:  The patient was given information about Chronic Care Management services, agreed to services, and gave verbal consent prior to initiation of services.  Please see initial visit note for detailed documentation.   Patient agreed to services and consent obtained.   Assessment: Review of patient past medical history, allergies, medications, and health status, including review of relevant consultants reports was performed today as part of a comprehensive evaluation and provision of chronic care management and care coordination services.     SDOH (Social Determinants of Health) assessments and interventions performed:    Advanced Directives Status: Not addressed in this encounter.  CCM Care Plan  No Known Allergies  Outpatient Encounter Medications as of 05/17/2021  Medication Sig   amLODipine (NORVASC) 5 MG tablet TAKE 1 TABLET(5 MG) BY MOUTH DAILY   atorvastatin (LIPITOR) 20 MG tablet TAKE 1 TABLET BY MOUTH EVERY DAY   cromolyn (OPTICROM) 4 % ophthalmic solution Place 1 drop into both eyes 2 (two) times daily.   escitalopram (LEXAPRO) 10 MG tablet TAKE 1 TABLET(10 MG) BY MOUTH DAILY   lisinopril-hydrochlorothiazide (ZESTORETIC) 20-12.5 MG tablet TAKE 1 TABLET BY MOUTH DAILY   Melatonin 2.5 MG CAPS One at bedtime po   naproxen (NAPROSYN) 500 MG tablet Take 1 tablet by mouth every 12 (twelve) hours as needed.    neomycin-polymyxin b-dexamethasone  (MAXITROL) 3.5-10000-0.1 OINT SMARTSIG:1 Inch(es) In Eye(s) Every Night   nystatin (NYSTATIN) powder Apply topically 4 (four) times daily.   OXYBUTYNIN CHLORIDE PO    Polyethylene Glycol 3350 (PEG 3350 PO) See admin instructions.   prednisoLONE acetate (PRED FORTE) 1 % ophthalmic suspension    umeclidinium-vilanterol (ANORO ELLIPTA) 62.5-25 MCG/INH AEPB Inhale 1 puff into the lungs daily.   VITAMIN D PO Take 5,000 mg by mouth. Take 1 daily mon through fri   No facility-administered encounter medications on file as of 05/17/2021.    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   Family history of malignant neoplasm of gastrointestinal tract 10/05/2020   Personal history of colonic polyps 10/05/2020   Impairment of balance 12/18/2019   Muscle weakness 12/18/2019   Urinary frequency 10/31/2018   Prediabetes 10/31/2018   OSA on CPAP 07/22/2018   CPAP use counseling 07/22/2018   Abnormal dreams 07/22/2018   Chronic obstructive pulmonary disease (HCC) 03/21/2018   Other fatigue 03/21/2018   Snorings 03/21/2018   Nocturia more than twice per night 03/21/2018   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   Pain in right knee 01/04/2018   Overweight (BMI 25.0-29.9) 11/01/2016   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   Murmur 08/01/2016   Shortness of breath 08/01/2016   Abnormal EKG 08/01/2016   Essential hypertension 08/01/2016   Depression 08/01/2016    Conditions to be addressed/monitored: HTN, COPD, and Prediabetes  Care Plan : Social Work [facility] Care Plan  Updates made by [name] since 05/17/2021 12:00 AM     Problem: Barriers to Treatment      Long-Range Goal: Barriers to Treatment Identified and Managed   Start Date: 05/03/2021  Expected End Date: 08/01/2021  This Visit's Progress:  On track  Priority: Medium  Note:   Current Barriers:  Chronic disease management support and education needs related to HTN, COPD, and Prediabetes   Does not contact provider office for questions/concerns  Social Worker Clinical Goal(s):  patient will work with SW to identify and address any acute and/or chronic care coordination needs related to the self health management of HTN, COPD, and Prediabetes   Patient will attend Chest CT appointment as ordered by her pulmonologist   SW Interventions:  Inter-disciplinary care team collaboration (see longitudinal plan of care) Collaboration with Dorothyann Peng, MD regarding development and update of comprehensive plan of care as evidenced by provider attestation and co-signature Inbound call received from the patient who reports she did complete her chest CT but is scared to receive results Patient reports she received a voice message from [facility] but did not return the call due to being fearful of imaging results. When the patient chose to return the call she was on hold and hung up Performed chart review to note the patient does not have an upcoming appointment with her pulmonologist Encouraged the patient to contact [facility] to obtain results of CT - patient stated understanding Discussed the patient has been sleeping more often and feeling overwhelmed with household chores Noted patient is currently on medication for depression Attempted to schedule the patient an office visit with her primary care provider to discuss current feelings of being overwhelmed and to determine if patients medication dose is effective - patient states she does not feel she is truly depressed and does not want to schedule an office visit at this time SW encouraged the patient to contact her primary provider if she would like to schedule an office visit - patient stated understanding Scheduled follow up call over the next month  Patient  Goals/Self-Care Activities patient will:   -  Psychologist, educational Endocrinology to obtain CT imaging results -Contact primary care provider to schedule an office visit as needed -Contact SW as needed prior to next scheduled call  Follow Up Plan: The care management team will reach out to the patient again over the next 30 days.        Follow Up Plan: SW will follow up with patient by phone over the next month      [name], BSW, CDP Social Worker, Certified Dementia Practitioner [facility] Care Management [phone]

## 2021-05-18 ENCOUNTER — Telehealth: Payer: Self-pay

## 2021-05-24 ENCOUNTER — Telehealth: Payer: Self-pay

## 2021-05-24 NOTE — Chronic Care Management (AMB) (Signed)
    Chronic Care Management Pharmacy Assistant   Name: Katherine Summers  MRN: [id number] DOB: 12-Nov-1945   Reason for Encounter: Disease State/ Hypertension  Recent office visits:  04-14-2021 [name], RN (CCM)  05-03-2021 [name] (CCM)  Recent consult visits:  04-13-2021 Martina Sinner, MD (Pulmonary). Pulmonary function test performed. STOP myrbetriq. START anoro ellipta 1 puff daily. CT chest high resolution ordered.  Hospital visits:  None in previous 6 months  Medications: Outpatient Encounter Medications as of 05/24/2021  Medication Sig   amLODipine (NORVASC) 5 MG tablet TAKE 1 TABLET(5 MG) BY MOUTH DAILY   atorvastatin (LIPITOR) 20 MG tablet TAKE 1 TABLET BY MOUTH EVERY DAY   cromolyn (OPTICROM) 4 % ophthalmic solution Place 1 drop into both eyes 2 (two) times daily.   escitalopram (LEXAPRO) 10 MG tablet TAKE 1 TABLET(10 MG) BY MOUTH DAILY   lisinopril-hydrochlorothiazide (ZESTORETIC) 20-12.5 MG tablet TAKE 1 TABLET BY MOUTH DAILY   Melatonin 2.5 MG CAPS One at bedtime po   naproxen (NAPROSYN) 500 MG tablet Take 1 tablet by mouth every 12 (twelve) hours as needed.    neomycin-polymyxin b-dexamethasone (MAXITROL) 3.5-10000-0.1 OINT SMARTSIG:1 Inch(es) In Eye(s) Every Night   nystatin (NYSTATIN) powder Apply topically 4 (four) times daily.   OXYBUTYNIN CHLORIDE PO    Polyethylene Glycol 3350 (PEG 3350 PO) See admin instructions.   prednisoLONE acetate (PRED FORTE) 1 % ophthalmic suspension    umeclidinium-vilanterol (ANORO ELLIPTA) 62.5-25 MCG/INH AEPB Inhale 1 puff into the lungs daily.   VITAMIN D PO Take 5,000 mg by mouth. Take 1 daily mon through fri   No facility-administered encounter medications on file as of 05/24/2021.  Reviewed chart prior to disease state call. Spoke with patient regarding BP  Recent Office Vitals: BP Readings from Last 3 Encounters:  04/13/21 128/74  12/22/20 138/80  11/17/20 124/86   Pulse Readings from Last 3 Encounters:   04/13/21 72  12/22/20 73  11/17/20 66    Wt Readings from Last 3 Encounters:  04/13/21 168 lb (76.2 kg)  12/22/20 178 lb 6.4 oz (80.9 kg)  11/17/20 178 lb 6.4 oz (80.9 kg)     Kidney Function Lab Results  Component Value Date/Time   CREATININE 0.76 11/17/2020 10:24 AM   CREATININE 0.85 05/20/2020 09:55 AM   GFRNONAA 68 05/20/2020 09:55 AM   GFRAA 79 05/20/2020 09:55 AM    BMP Latest Ref Rng & Units 11/17/2020 05/20/2020 11/12/2019  Glucose 65 - 99 mg/dL 062(I) 948(N) 80  BUN 8 - 27 mg/dL 17 12 13   Creatinine 0.57 - 1.00 mg/dL 4.62 7.03  BUN/Creat Ratio 12 - 28 22 14 15   Sodium 134 - 144 mmol/L 137 147(H) 145(H)  Potassium 3.5 - 5.2 mmol/L 3.4(L) 3.7 3.8  Chloride 96 - 106 mmol/L 97 106 102  CO2 20 - 29 mmol/L 23 26 27   Calcium 8.7 - 10.3 mg/dL 9.7 9.8 5.00     : 1st attempt left vm  05-30-2021: 2nd attempt  left vm 05-31-2021: 3rd attempt left VM  Care Gaps: Shingrix overdue Yearly foot exam overdue Covid booster overdue Yearly foot exam overdue Yearly ophthalmology exam overdue Medicare wellness 01-04-2022  Star Rating Drugs: Atorvastatin 20 mg- Last filled 03-19-2021 90 DS [facility] Lisinopril/ HCTZ 20- 12.5 mg- Last filled 03-05-2021 90 DS [facility]  [place] [facility] CMA Clinical Pharmacist Assistant [phone]

## 2021-06-03 ENCOUNTER — Other Ambulatory Visit: Payer: Self-pay | Admitting: Internal Medicine

## 2021-06-03 DIAGNOSIS — I10 Essential (primary) hypertension: Secondary | ICD-10-CM | POA: Diagnosis not present

## 2021-06-03 DIAGNOSIS — J449 Chronic obstructive pulmonary disease, unspecified: Secondary | ICD-10-CM | POA: Diagnosis not present

## 2021-06-06 ENCOUNTER — Ambulatory Visit (INDEPENDENT_AMBULATORY_CARE_PROVIDER_SITE_OTHER): Payer: Medicare Other

## 2021-06-06 DIAGNOSIS — J449 Chronic obstructive pulmonary disease, unspecified: Secondary | ICD-10-CM

## 2021-06-06 DIAGNOSIS — I10 Essential (primary) hypertension: Secondary | ICD-10-CM

## 2021-06-06 NOTE — Chronic Care Management (AMB) (Signed)
Chronic Care Management    Social Work Note  06/06/2021 Name: Katherine Summers MRN: [id number] DOB: [birth year]  Katherine Summers is a 75 y.o. year old female who is a primary care patient of Dorothyann Peng, MD. The CCM team was consulted to assist the patient with chronic disease management and/or care coordination needs related to:  HTN and COPD .   Engaged with patient by telephone for follow up visit in response to provider referral for social work chronic care management and care coordination services.   Consent to Services:  The patient was given information about Chronic Care Management services, agreed to services, and gave verbal consent prior to initiation of services.  Please see initial visit note for detailed documentation.   Patient agreed to services and consent obtained.   Assessment: Review of patient past medical history, allergies, medications, and health status, including review of relevant consultants reports was performed today as part of a comprehensive evaluation and provision of chronic care management and care coordination services.     SDOH (Social Determinants of Health) assessments and interventions performed:    Advanced Directives Status: Not addressed in this encounter.  CCM Care Plan  No Known Allergies  Outpatient Encounter Medications as of 06/06/2021  Medication Sig   amLODipine (NORVASC) 5 MG tablet TAKE 1 TABLET(5 MG) BY MOUTH DAILY   atorvastatin (LIPITOR) 20 MG tablet TAKE 1 TABLET BY MOUTH EVERY DAY   cromolyn (OPTICROM) 4 % ophthalmic solution Place 1 drop into both eyes 2 (two) times daily.   escitalopram (LEXAPRO) 10 MG tablet TAKE 1 TABLET(10 MG) BY MOUTH DAILY   lisinopril-hydrochlorothiazide (ZESTORETIC) 20-12.5 MG tablet TAKE 1 TABLET BY MOUTH DAILY   Melatonin 2.5 MG CAPS One at bedtime po   naproxen (NAPROSYN) 500 MG tablet Take 1 tablet by mouth every 12 (twelve) hours as needed.    neomycin-polymyxin b-dexamethasone (MAXITROL)  3.5-10000-0.1 OINT SMARTSIG:1 Inch(es) In Eye(s) Every Night   nystatin (NYSTATIN) powder Apply topically 4 (four) times daily.   OXYBUTYNIN CHLORIDE PO    Polyethylene Glycol 3350 (PEG 3350 PO) See admin instructions.   prednisoLONE acetate (PRED FORTE) 1 % ophthalmic suspension    umeclidinium-vilanterol (ANORO ELLIPTA) 62.5-25 MCG/INH AEPB Inhale 1 puff into the lungs daily.   VITAMIN D PO Take 5,000 mg by mouth. Take 1 daily mon through fri   No facility-administered encounter medications on file as of 06/06/2021.    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   Family history of malignant neoplasm of gastrointestinal tract 10/05/2020   Personal history of colonic polyps 10/05/2020   Impairment of balance 12/18/2019   Muscle weakness 12/18/2019   Urinary frequency 10/31/2018   Prediabetes 10/31/2018   OSA on CPAP 07/22/2018   CPAP use counseling 07/22/2018   Abnormal dreams 07/22/2018   Chronic obstructive pulmonary disease (HCC) 03/21/2018   Other fatigue 03/21/2018   Snorings 03/21/2018   Nocturia more than twice per night 03/21/2018   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   Pain in right knee 01/04/2018   Overweight (BMI 25.0-29.9) 11/01/2016   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   Murmur 08/01/2016   Shortness of breath 08/01/2016   Abnormal EKG 08/01/2016   Essential hypertension 08/01/2016   Depression 08/01/2016    Conditions to be addressed/monitored: HTN and COPD  Care Plan :  Social Work [facility] Care Plan  Updates made by [name] since 06/06/2021 12:00 AM     Problem: Barriers to Treatment      Long-Range Goal: Barriers to Treatment Identified and Managed   Start Date: 05/03/2021  Expected End Date: 08/01/2021  Recent Progress: On track  Priority: Medium   Note:   Current Barriers:  Chronic disease management support and education needs related to HTN, COPD, and Prediabetes   Does not contact provider office for questions/concerns  Social Worker Clinical Goal(s):  patient will work with SW to identify and address any acute and/or chronic care coordination needs related to the self health management of HTN, COPD, and Prediabetes   Patient will attend Chest CT appointment as ordered by her pulmonologist   SW Interventions:  Inter-disciplinary care team collaboration (see longitudinal plan of care) Collaboration with Dorothyann Peng, MD regarding development and update of comprehensive plan of care as evidenced by provider attestation and co-signature Successful outbound call placed to the patient to assess goal progression Determined the patient did speak with her pulmonology team and was advised to contact her health plan regarding recommended inhalers to determine which formulary is covered Patient reports she has yet to contact [facility] and does not recall the names of the medications that are recommended Advised the patient SW would collaborate with the embedded pharmacy team to request assistance with medication related needs Collaboration with Cherylin Mylar requesting patient follow up Scheduled follow up call over the next month  Patient Goals/Self-Care Activities patient will:   - Engage with embedded pharmacy team -Contact primary care provider to schedule an office visit as needed -Contact SW as needed prior to next scheduled call  Follow Up Plan: The care management team will reach out to the patient again over the next 30 days.        Follow Up Plan: SW will follow up with patient by phone over the next 21      [name], BSW, CDP Social Worker, Certified Dementia Practitioner [facility] Care Management [phone]

## 2021-06-06 NOTE — Patient Instructions (Signed)
Social Worker Visit Information  Goals we discussed today:   Goals Addressed             This Visit's Progress    Barriers to Treatment Identified and Managed   On track    Timeframe:  Long-Range Goal Priority:  Medium Start Date:   8.30.22                          Expected End Date: 11.28.22                      Next planned outreach: 10.17.22  Patient Goals/Self-Care Activities patient will:   - Engage with embedded pharmacy team -Contact primary care provider to schedule an office visit as needed -Contact SW as needed prior to next scheduled call         Materials Provided: Verbal education about pharmacy program provided by phone  The patient verbalized understanding of instructions, educational materials, and care plan provided today and declined offer to receive copy of patient instructions, educational materials, and care plan.   Follow Up Plan: SW will follow up with patient by phone over the next 21 days   [name], BSW, CDP Social Worker, Certified Dementia Practitioner [facility] Care Management [phone]

## 2021-06-08 ENCOUNTER — Telehealth: Payer: Self-pay

## 2021-06-08 NOTE — Chronic Care Management (AMB) (Signed)
    Chronic Care Management Pharmacy Assistant   Name: Katherine Summers  MRN: [id number] DOB: March 23, 1946  Reason for Encounter: Patient Assistance Coordination  06/08/2021-  Patient needed assistance with formulary coverage for inhalers. Patient was given Anoro Ellipta inhaler from Dr Francine Graven [facility] Pulmonology). Patient is unable to afford this inhaler. Checked [employer] Formulary for 2022, patient list of Tier 1 & 2 inhalers sent to Dr Lanora Manis nurse Ezra Sites to discuss with Dr Francine Graven. Called patient to inform, she is aware that formulary inhalers were found and she should be getting a call from Dr Lanora Manis office for more information, if she has not heard anything from office, she should follow up with Dr. Cindi Carbon office. Patient understands and agrees with plan. Also emailed list of Tier 1 & 2 inhalers to patient. Patient requested.   Tier 1 drug coverage is :  ProAir albuterol sulfate hfa  aerosol solution 108 (90 1  HFA or  Proventil  base) mcg/act inhalation HFA  ARNUITY ELLIPTA  budesonide inhalation  FLOVENT DISKUS  FLOVENT HFA  ipratropium-albuterol  PULMICORT FLEXHALER    Tier 2 Drug coverage:  COMBIVENT RESPIMAT  FLUTICASONESALMETEROL  INHALATION AEROSOL  POWDER BREATH  ACTIVATED 113-14  MCG/ACT, 232-14  MCG/ACT, 55-14  SEREVENT DISKUS  SPIRIVA HANDIHALER  SPIRIVA RESPIMAT  STRIVERDI RESPIMAT  TRELEGY ELLIPTA INHALATION AEROSOL POWDER BREATH ACTIVATED 100-62.5-25  MCG/INH  TRELEGY ELLIPTA INHALATION AEROSOL POWDER BREATH ACTIVATED 200-62.5-25  MCG/INH     Medications: Outpatient Encounter Medications as of 06/08/2021  Medication Sig   amLODipine (NORVASC) 5 MG tablet TAKE 1 TABLET(5 MG) BY MOUTH DAILY   atorvastatin (LIPITOR) 20 MG tablet TAKE 1 TABLET BY MOUTH EVERY DAY   cromolyn (OPTICROM) 4 % ophthalmic solution Place 1 drop into both eyes 2 (two) times daily.   escitalopram (LEXAPRO) 10 MG tablet TAKE 1 TABLET(10 MG) BY MOUTH  DAILY   lisinopril-hydrochlorothiazide (ZESTORETIC) 20-12.5 MG tablet TAKE 1 TABLET BY MOUTH DAILY   Melatonin 2.5 MG CAPS One at bedtime po   naproxen (NAPROSYN) 500 MG tablet Take 1 tablet by mouth every 12 (twelve) hours as needed.    neomycin-polymyxin b-dexamethasone (MAXITROL) 3.5-10000-0.1 OINT SMARTSIG:1 Inch(es) In Eye(s) Every Night   nystatin (NYSTATIN) powder Apply topically 4 (four) times daily.   OXYBUTYNIN CHLORIDE PO    Polyethylene Glycol 3350 (PEG 3350 PO) See admin instructions.   prednisoLONE acetate (PRED FORTE) 1 % ophthalmic suspension    umeclidinium-vilanterol (ANORO ELLIPTA) 62.5-25 MCG/INH AEPB Inhale 1 puff into the lungs daily.   VITAMIN D PO Take 5,000 mg by mouth. Take 1 daily mon through fri   No facility-administered encounter medications on file as of 06/08/2021.   Billee Cashing, CMA Clinical Pharmacist Assistant [phone]

## 2021-06-09 ENCOUNTER — Telehealth: Payer: Self-pay

## 2021-06-09 NOTE — Chronic Care Management (AMB) (Signed)
Chronic Care Management Pharmacy Assistant   Name: Katherine Summers  MRN: [id number] DOB: 01/09/1946   Reason for Encounter: Disease State/ Hypertension  Recent office visits:  04-14-2021 [name], RN (CCM)  05-03-2021 [name] (CCM)  05-17-2021 [name] (CCM)  06-06-2021 [name] (CCM)  Recent consult visits:  04-13-2021 Martina Sinner, MD (Pulmonary). Pulmonary function test.  04-13-2021 Martina Sinner, MD (Pulmonary). CT chest ordered. STOP myrbetriq. START anoro ellipta 62.5-25 mcg inhaler inhale one puff daily.  Hospital visits:  None in previous 6 months  Medications: Outpatient Encounter Medications as of 06/09/2021  Medication Sig   amLODipine (NORVASC) 5 MG tablet TAKE 1 TABLET(5 MG) BY MOUTH DAILY   atorvastatin (LIPITOR) 20 MG tablet TAKE 1 TABLET BY MOUTH EVERY DAY   cromolyn (OPTICROM) 4 % ophthalmic solution Place 1 drop into both eyes 2 (two) times daily.   escitalopram (LEXAPRO) 10 MG tablet TAKE 1 TABLET(10 MG) BY MOUTH DAILY   lisinopril-hydrochlorothiazide (ZESTORETIC) 20-12.5 MG tablet TAKE 1 TABLET BY MOUTH DAILY   Melatonin 2.5 MG CAPS One at bedtime po   naproxen (NAPROSYN) 500 MG tablet Take 1 tablet by mouth every 12 (twelve) hours as needed.    neomycin-polymyxin b-dexamethasone (MAXITROL) 3.5-10000-0.1 OINT SMARTSIG:1 Inch(es) In Eye(s) Every Night   nystatin (NYSTATIN) powder Apply topically 4 (four) times daily.   OXYBUTYNIN CHLORIDE PO    Polyethylene Glycol 3350 (PEG 3350 PO) See admin instructions.   prednisoLONE acetate (PRED FORTE) 1 % ophthalmic suspension    umeclidinium-vilanterol (ANORO ELLIPTA) 62.5-25 MCG/INH AEPB Inhale 1 puff into the lungs daily.   VITAMIN D PO Take 5,000 mg by mouth. Take 1 daily mon through fri   No facility-administered encounter medications on file as of 06/09/2021.   Reviewed chart prior to disease state call. Spoke with patient regarding BP  Recent Office Vitals: BP  Readings from Last 3 Encounters:  04/13/21 128/74  12/22/20 138/80  11/17/20 124/86   Pulse Readings from Last 3 Encounters:  04/13/21 72  12/22/20 73  11/17/20 66    Wt Readings from Last 3 Encounters:  04/13/21 168 lb (76.2 kg)  12/22/20 178 lb 6.4 oz (80.9 kg)  11/17/20 178 lb 6.4 oz (80.9 kg)     Kidney Function Lab Results  Component Value Date/Time   CREATININE 0.76 11/17/2020 10:24 AM   CREATININE 0.85 05/20/2020 09:55 AM   GFRNONAA 68 05/20/2020 09:55 AM   GFRAA 79 05/20/2020 09:55 AM    BMP Latest Ref Rng & Units 11/17/2020 05/20/2020 11/12/2019  Glucose 65 - 99 mg/dL 093(O) 671(I) 80  BUN 8 - 27 mg/dL 17 12 13   Creatinine 0.57 - 1.00 mg/dL 4.58 0.99  BUN/Creat Ratio 12 - 28 22 14 15   Sodium 134 - 144 mmol/L 137 147(H) 145(H)  Potassium 3.5 - 5.2 mmol/L 3.4(L) 3.7 3.8  Chloride 96 - 106 mmol/L 97 106 102  CO2 20 - 29 mmol/L 23 26 27   Calcium 8.7 - 10.3 mg/dL 9.7 9.8 8.33      : 1st attempt left VM 06-14-2021: 2nd attempt left VM 06-16-2021: 3rd attempt left VM  Care Gaps: Shingrix overdue Ophthalmology exam overdue last completed 07-18-2018 [facility] foot exam overdue last completed 10-31-2019 Flu vaccine overdue  6 month A1C overdue last completed 11-17-2020 AWV 01-04-2022  Star Rating Drugs: Atorvastatin 20 mg- Last filled 03-19-2021 90 DS [facility] Lisinopril/ HCTZ 20- 12.5 mg- Last filled 04-11-2021 90 DS [facility]  Malecca [facility] CMA Clinical Pharmacist  Assistant [phone]

## 2021-06-09 NOTE — Telephone Encounter (Signed)
LMTCB

## 2021-06-19 ENCOUNTER — Other Ambulatory Visit: Payer: Self-pay | Admitting: Internal Medicine

## 2021-06-20 ENCOUNTER — Ambulatory Visit: Payer: Medicare Other

## 2021-06-20 DIAGNOSIS — I10 Essential (primary) hypertension: Secondary | ICD-10-CM

## 2021-06-20 DIAGNOSIS — R7303 Prediabetes: Secondary | ICD-10-CM

## 2021-06-20 DIAGNOSIS — J449 Chronic obstructive pulmonary disease, unspecified: Secondary | ICD-10-CM

## 2021-06-20 NOTE — Chronic Care Management (AMB) (Signed)
Chronic Care Management    Social Work Note  06/20/2021 Name: Katherine Summers MRN: [id number] DOB: [birth year]  Katherine Summers is a 75 y.o. year old female who is a primary care patient of Dorothyann Peng, MD. The CCM team was consulted to assist the patient with chronic disease management and/or care coordination needs related to:  HTN, COPD, Prediabetes .   Engaged with patient by telephone for follow up visit in response to provider referral for social work chronic care management and care coordination services.   Consent to Services:  The patient was given information about Chronic Care Management services, agreed to services, and gave verbal consent prior to initiation of services.  Please see initial visit note for detailed documentation.   Patient agreed to services and consent obtained.   Assessment: Review of patient past medical history, allergies, medications, and health status, including review of relevant consultants reports was performed today as part of a comprehensive evaluation and provision of chronic care management and care coordination services.     SDOH (Social Determinants of Health) assessments and interventions performed:    Advanced Directives Status: Not addressed in this encounter.  CCM Care Plan  No Known Allergies  Outpatient Encounter Medications as of 06/20/2021  Medication Sig   amLODipine (NORVASC) 5 MG tablet TAKE 1 TABLET(5 MG) BY MOUTH DAILY   atorvastatin (LIPITOR) 20 MG tablet TAKE 1 TABLET BY MOUTH EVERY DAY   cromolyn (OPTICROM) 4 % ophthalmic solution Place 1 drop into both eyes 2 (two) times daily.   escitalopram (LEXAPRO) 10 MG tablet TAKE 1 TABLET(10 MG) BY MOUTH DAILY   lisinopril-hydrochlorothiazide (ZESTORETIC) 20-12.5 MG tablet TAKE 1 TABLET BY MOUTH DAILY   Melatonin 2.5 MG CAPS One at bedtime po   naproxen (NAPROSYN) 500 MG tablet Take 1 tablet by mouth every 12 (twelve) hours as needed.    neomycin-polymyxin b-dexamethasone  (MAXITROL) 3.5-10000-0.1 OINT SMARTSIG:1 Inch(es) In Eye(s) Every Night   nystatin (NYSTATIN) powder Apply topically 4 (four) times daily.   OXYBUTYNIN CHLORIDE PO    Polyethylene Glycol 3350 (PEG 3350 PO) See admin instructions.   prednisoLONE acetate (PRED FORTE) 1 % ophthalmic suspension    umeclidinium-vilanterol (ANORO ELLIPTA) 62.5-25 MCG/INH AEPB Inhale 1 puff into the lungs daily.   VITAMIN D PO Take 5,000 mg by mouth. Take 1 daily mon through fri   No facility-administered encounter medications on file as of 06/20/2021.    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   Family history of malignant neoplasm of gastrointestinal tract 10/05/2020   Personal history of colonic polyps 10/05/2020   Impairment of balance 12/18/2019   Muscle weakness 12/18/2019   Urinary frequency 10/31/2018   Prediabetes 10/31/2018   OSA on CPAP 07/22/2018   CPAP use counseling 07/22/2018   Abnormal dreams 07/22/2018   Chronic obstructive pulmonary disease (HCC) 03/21/2018   Other fatigue 03/21/2018   Snorings 03/21/2018   Nocturia more than twice per night 03/21/2018   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   Pain in right knee 01/04/2018   Overweight (BMI 25.0-29.9) 11/01/2016   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   Murmur 08/01/2016   Shortness of breath 08/01/2016   Abnormal EKG 08/01/2016   Essential hypertension 08/01/2016   Depression 08/01/2016    Conditions to be addressed/monitored: HTN, COPD, and Prediabetes  Care  Plan : Social Work [facility] Care Plan  Updates made by [name] since 06/20/2021 12:00 AM  Completed 06/20/2021   Problem: Barriers to Treatment Resolved 06/20/2021     Long-Range Goal: Barriers to Treatment Identified and Managed Completed 06/20/2021  Start Date:  05/03/2021  Expected End Date: 08/01/2021  Recent Progress: On track  Priority: Medium  Note:   Current Barriers:  Chronic disease management support and education needs related to HTN, COPD, and Prediabetes   Does not contact provider office for questions/concerns  Social Worker Clinical Goal(s):  patient will work with SW to identify and address any acute and/or chronic care coordination needs related to the self health management of HTN, COPD, and Prediabetes   Patient will attend Chest CT appointment as ordered by her pulmonologist   SW Interventions:  Inter-disciplinary care team collaboration (see longitudinal plan of care) Collaboration with Dorothyann Peng, MD regarding development and update of comprehensive plan of care as evidenced by provider attestation and co-signature Collaboration with pharmacy team member Billee Cashing who indicates she has informed both the patient and her pulmonologist of inhalers covered under the patients health plan Successful outbound call placed to the patient to assess goal progression Determined the patient has yet to receive an order for an inhaler from her pulmonologist Discussed the patient may need to contact her pulmonologist directly to request assistance with inhaler concerns Patient reports she "may give them a call" Performed chart review to note patient does not have an upcomming office visit with her pulmonologist - patient reports she is not supposed to go back Performed chart review to note last office visit note in August 2021 stated to return in 3 months Encouraged the patient to contact her pulmonologist to schedule a follow up appointment as well as to request assistance with obtaining an inhaler Advised the patient RN Care Manager Lawanna Kobus Little has a planned follow up appointment with the patient on 07/15/21 - requested the patient contact her pulmonologist prior to this visit - patient stated understanding Assessed for SW needs -  none identified at this time Discussed plan for SW to close goal while patient remains actively engaged with RN Care Manager Encouraged the patient to contact SW as needed Collaboration with RN Care Manager advising of intervention and plan  Patient Goals/Self-Care Activities patient will:   - Contact pulmonologist to request follow up on inhaler -Schedule follow up visit with pulmonologist -Contact SW as needed  Follow Up Plan: No SW follow up planned at this time       Follow Up Plan:  No SW follow up planned at this time. The patient will remain engaged with RN Care Manager.      [name], BSW, CDP Social Worker, Certified Dementia Practitioner [facility] Care Management [phone]

## 2021-06-20 NOTE — Patient Instructions (Signed)
Social Worker Visit Information  Goals we discussed today:   Goals Addressed             This Visit's Progress    COMPLETED: Barriers to Treatment Identified and Managed       Timeframe:  Long-Range Goal Priority:  Medium Start Date:   8.30.22                          Expected End Date: 11.28.22                      Patient Goals/Self-Care Activities patient will:   - Contact pulmonologist to request follow up on inhaler -Schedule follow up visit with pulmonologist -Contact SW as needed         The patient verbalized understanding of instructions, educational materials, and care plan provided today and declined offer to receive copy of patient instructions, educational materials, and care plan.   Follow Up Plan:  No SW follow up planned at this time. Please contact me as needed.  [name], BSW, CDP Social Worker, Certified Dementia Practitioner [facility] Care Management [phone]

## 2021-06-23 ENCOUNTER — Telehealth: Payer: Self-pay | Admitting: Pulmonary Disease

## 2021-06-23 NOTE — Telephone Encounter (Signed)
LMTCB 10:51 AM   After leaving msg I realized that she wanted to be called back after 1 pm Will try her back then

## 2021-06-28 ENCOUNTER — Telehealth: Payer: Self-pay

## 2021-06-28 ENCOUNTER — Other Ambulatory Visit (HOSPITAL_COMMUNITY): Payer: Self-pay

## 2021-06-28 NOTE — Telephone Encounter (Signed)
Ran test claim on those provided: Anoro: $35/co-pay Bevespi: Requires step therapy (Anoro preferred) will need PA Stiolto: $35/co-pay

## 2021-06-28 NOTE — Telephone Encounter (Signed)
Routing this info to Dr. Francine Graven to see if either Anoro or Stiolto would be okay for pt or if there was another inhaler that he was wanting to have pt try instead.

## 2021-06-28 NOTE — Telephone Encounter (Signed)
Called and spoke with pt who wanted to know med JD wanted to put her on to help with her COPD. Asked pt to contact insurance to see if they could provide her a list of covered inhalers and pt said that one of the staff at the office had given her a list but she then misplaced it.  Routing to pharmacy team to see if they can help [place] figure out which inhalers are covered by pt's insurance.

## 2021-06-28 NOTE — Telephone Encounter (Signed)
Hi! Are we looking to stay within the Laba/Lama class? [place] Records, or Stiolto?

## 2021-06-28 NOTE — Telephone Encounter (Signed)
I left the pt a message that I was returning her call to schedule her an appt. The pt left a message that she needed an appt with a bladder specialist.

## 2021-06-29 ENCOUNTER — Other Ambulatory Visit (HOSPITAL_COMMUNITY): Payer: Self-pay

## 2021-06-29 ENCOUNTER — Other Ambulatory Visit: Payer: Self-pay

## 2021-06-29 ENCOUNTER — Ambulatory Visit (INDEPENDENT_AMBULATORY_CARE_PROVIDER_SITE_OTHER): Payer: Medicare Other | Admitting: Nurse Practitioner

## 2021-06-29 ENCOUNTER — Encounter: Payer: Self-pay | Admitting: Nurse Practitioner

## 2021-06-29 VITALS — BP 130/72 | HR 70 | Temp 98.1°F | Ht 65.4 in | Wt 176.0 lb

## 2021-06-29 DIAGNOSIS — E7800 Pure hypercholesterolemia, unspecified: Secondary | ICD-10-CM

## 2021-06-29 DIAGNOSIS — N393 Stress incontinence (female) (male): Secondary | ICD-10-CM

## 2021-06-29 DIAGNOSIS — R350 Frequency of micturition: Secondary | ICD-10-CM | POA: Diagnosis not present

## 2021-06-29 DIAGNOSIS — I10 Essential (primary) hypertension: Secondary | ICD-10-CM

## 2021-06-29 DIAGNOSIS — R7303 Prediabetes: Secondary | ICD-10-CM

## 2021-06-29 LAB — POCT URINALYSIS DIPSTICK
Bilirubin, UA: NEGATIVE
Glucose, UA: NEGATIVE
Ketones, UA: NEGATIVE
Leukocytes, UA: NEGATIVE
Nitrite, UA: NEGATIVE
Protein, UA: NEGATIVE
Spec Grav, UA: 1.02 (ref 1.010–1.025)
Urobilinogen, UA: 0.2 E.U./dL
pH, UA: 6.5 (ref 5.0–8.0)

## 2021-06-29 NOTE — Telephone Encounter (Signed)
Per the staff message, JD was ok with Trelegy .   Called patient but the call got disconnected. Tried to call her back but the line was busy. Will attempt to call back later.

## 2021-06-29 NOTE — Progress Notes (Signed)
I,Tianna Badgett,acting as a Education administrator for [employer], NP.,have documented all relevant documentation on the behalf of [employer], NP,as directed by  Bary Castilla, NP while in the presence of Bary Castilla, NP.  This visit occurred during the SARS-CoV-2 public health emergency.  Safety protocols were in place, including screening questions prior to the visit, additional usage of staff PPE, and extensive cleaning of exam room while observing appropriate contact time as indicated for disinfecting solutions.  Subjective:     Patient ID: Katherine Summers , female    DOB: Jan 04, 1946 , 75 y.o.   MRN: [id number]   Chief Complaint  Patient presents with   Urinary Frequency    HPI  Patient is here for urinary frequency.  She goes every hour at night time. She has a kidney specialist that she sees and follows. She wears a pad during the day. All the meds were evaluated with the patient. She is taking Linisipril /hctz 12.5.   Urinary Frequency  Associated symptoms include frequency and urgency. Pertinent negatives include no chills, flank pain or vomiting.    Past Medical History:  Diagnosis Date   Allergy    CKD (chronic kidney disease) stage 2, GFR 60-89 ml/min    COPD (chronic obstructive pulmonary disease) (HCC)    Depression 08/01/2016   Dizziness and giddiness    Essential hypertension 08/01/2016   Hyperlipidemia    Hypertension    Murmur 08/01/2016   Pre-diabetes    Shortness of breath 08/01/2016   Vitamin D deficiency      Family History  Problem Relation Age of Onset   Stroke Mother    Colon cancer Father    Colon cancer Sister    Stroke Maternal Grandmother    Colon cancer Paternal Grandfather      Current Outpatient Medications:    amLODipine (NORVASC) 5 MG tablet, TAKE 1 TABLET(5 MG) BY MOUTH DAILY, Disp: 90 tablet, Rfl: 2   atorvastatin (LIPITOR) 20 MG tablet, TAKE 1 TABLET BY MOUTH EVERY DAY, Disp: 90 tablet, Rfl: 2   cromolyn (OPTICROM) 4 %  ophthalmic solution, Place 1 drop into both eyes 2 (two) times daily., Disp: , Rfl:    escitalopram (LEXAPRO) 10 MG tablet, TAKE 1 TABLET(10 MG) BY MOUTH DAILY, Disp: 90 tablet, Rfl: 1   lisinopril-hydrochlorothiazide (ZESTORETIC) 20-12.5 MG tablet, TAKE 1 TABLET BY MOUTH DAILY, Disp: 90 tablet, Rfl: 2   Melatonin 2.5 MG CAPS, One at bedtime po, Disp: 30 capsule, Rfl: 0   naproxen (NAPROSYN) 500 MG tablet, Take 1 tablet by mouth every 12 (twelve) hours as needed. , Disp: , Rfl:    neomycin-polymyxin b-dexamethasone (MAXITROL) 3.5-10000-0.1 OINT, SMARTSIG:1 Inch(es) In Eye(s) Every Night, Disp: , Rfl:    nystatin (NYSTATIN) powder, Apply topically 4 (four) times daily., Disp: 15 g, Rfl: 0   OXYBUTYNIN CHLORIDE PO, , Disp: , Rfl:    Polyethylene Glycol 3350 (PEG 3350 PO), See admin instructions., Disp: , Rfl:    prednisoLONE acetate (PRED FORTE) 1 % ophthalmic suspension, , Disp: , Rfl:    umeclidinium-vilanterol (ANORO ELLIPTA) 62.5-25 MCG/INH AEPB, Inhale 1 puff into the lungs daily., Disp: 2 each, Rfl: 0   VITAMIN D PO, Take 5,000 mg by mouth. Take 1 daily mon through fri, Disp: , Rfl:    No Known Allergies   Review of Systems  Constitutional:  Negative for chills and fever.  HENT:  Negative for congestion, rhinorrhea, sinus pressure and sinus pain.   Respiratory:  Negative for cough, shortness of  breath and wheezing.   Cardiovascular:  Negative for chest pain and palpitations.  Gastrointestinal:  Negative for abdominal distention, abdominal pain, constipation, diarrhea and vomiting.  Endocrine: Negative for polydipsia and polyphagia.  Genitourinary:  Positive for frequency and urgency. Negative for decreased urine volume, flank pain, vaginal bleeding, vaginal discharge and vaginal pain.  Musculoskeletal:  Negative for arthralgias and myalgias.  Neurological:  Negative for weakness, numbness and headaches.    Today's Vitals   06/29/21 1055  BP: 130/72  Pulse: 70  Temp: 98.1 F (36.7  C)  TempSrc: Oral  Weight: 176 lb (79.8 kg)  Height: 5' 5.4" (1.661 m)   Body mass index is 28.93 kg/m.  Wt Readings from Last 3 Encounters:  06/29/21 176 lb (79.8 kg)  04/13/21 168 lb (76.2 kg)  12/22/20 178 lb 6.4 oz (80.9 kg)    Objective:  Physical Exam Constitutional:      Appearance: Normal appearance.  HENT:     Head: Normocephalic and atraumatic.  Cardiovascular:     Rate and Rhythm: Normal rate and regular rhythm.     Pulses: Normal pulses.     Heart sounds: Normal heart sounds. No murmur heard. Pulmonary:     Effort: Pulmonary effort is normal. No respiratory distress.     Breath sounds: Normal breath sounds. No wheezing.  Skin:    General: Skin is warm and dry.     Capillary Refill: Capillary refill takes less than 2 seconds.  Neurological:     Mental Status: She is alert and oriented to person, place, and time.  Psychiatric:        Mood and Affect: Mood normal.        Behavior: Behavior normal.        Thought Content: Thought content normal.        Judgment: Judgment normal.     Assessment And Plan:     1. Urinary frequency - Ambulatory referral to Urology - POCT Urinalysis Dipstick (81002)  2. Essential hypertension -Limit the intake of processed foods and salt intake. You should increase your intake of green vegetables and fruits. Limit the use of alcohol. Limit fast foods and fried foods. Avoid high fatty saturated and trans fat foods. Keep yourself hydrated with drinking water. Avoid red meats. Eat lean meats instead. Exercise for atleast 30-45 min for atleast 4-5 times a week.  -Chronic, Stable  Continue meds  - CMP14+EGFR  3. Prediabetes - Hemoglobin A1c - CMP14+EGFR  4. Pure hypercholesterolemia --Educated patient about a diet that is low in fat and high fatty foods including dairy products. Increase in take of fish and fiber. Decrease intake of red meats and fast foods. Exercise for atleast 4-5 times a week or atleast 30-45 min. Drink a lot  of water.   - Lipid panel  5. Stress incontinence - Ambulatory referral to Urology  -Advised patient Kegel incontinence -Showed pt.a a video on kegel exercise   The patient was encouraged to call or send a message through [place] for any questions or concerns.   Follow up: if symptoms persist or do not get better.   Side effects and appropriate use of all the medication(s) were discussed with the patient today. Patient advised to use the medication(s) as directed by their healthcare provider. The patient was encouraged to read, review, and understand all associated package inserts and contact our office with any questions or concerns. The patient accepts the risks of the treatment plan and had an opportunity to ask questions.  Staying healthy and adopting a healthy lifestyle for your overall health is important. You should eat 7 or more servings of fruits and vegetables per day. You should drink plenty of water to keep yourself hydrated and your kidneys healthy. This includes about 65-80+ fluid ounces of water. Limit your intake of animal fats especially for elevated cholesterol. Avoid highly processed food and limit your salt intake if you have hypertension. Avoid foods high in saturated/Trans fats. Along with a healthy diet it is also very important to maintain time for yourself to maintain a healthy mental health with low stress levels. You should get atleast 150 min of moderate intensity exercise weekly for a healthy heart. Along with eating right and exercising, aim for at least 7-9 hours of sleep daily.  Eat more whole grains which includes barley, wheat berries, oats, brown rice and whole wheat pasta. Use healthy plant oils which include olive, soy, corn, sunflower and peanut. Limit your caffeine and sugary drinks. Limit your intake of fast foods. Limit milk and dairy products to one or two daily servings.   IF YOU HAVE BEEN REFERRED TO A SPECIALIST, IT MAY TAKE 1-2 WEEKS TO SCHEDULE/PROCESS  THE REFERRAL. IF YOU HAVE NOT HEARD FROM US/SPECIALIST IN TWO WEEKS, PLEASE GIVE [place] A CALL AT [phone] X [phone].   THE PATIENT IS ENCOURAGED TO PRACTICE SOCIAL DISTANCING DUE TO THE COVID-19 PANDEMIC.

## 2021-06-29 NOTE — Telephone Encounter (Signed)
Found a staff message from "Billee Cashing" that stated Trelegy was covered. Per Burnell Blanks, she is not a member of the pharmacy team.   Pharmacy team, can we see if Trelegy 100 or 200 is covered for her? Thanks!

## 2021-06-29 NOTE — Telephone Encounter (Signed)
Ran test claim for Trelegy- both 100 or 200 have a copay of $35 for 1 month supply.

## 2021-06-29 NOTE — Patient Instructions (Signed)
Urinary Frequency, Adult Urinary frequency means urinating more often than usual. You may urinate every 1-2 hours even though you drink a normal amount of fluid and do not have a bladder infection or condition. Although you urinate more often than normal,the total amount of urine produced in a day is normal. With urinary frequency, you may have an urgent need to urinate often. The stress and anxiety of needing to find a bathroom quickly can make this urge worse. This condition may go away on its own or you may need treatment at home. Home treatment may include bladder training, exercises, taking medicines, ormaking changes to your diet. Follow these instructions at home: Bladder health  Keep a bladder diary if told by your health care provider. Keep track of: What you eat and drink. How often you urinate. How much you urinate. Follow a bladder training program if told by your health care provider. This may include: Learning to delay going to the bathroom. Double urinating (voiding). This helps if you are not completely emptying your bladder. Scheduled voiding. Do Kegel exercises as told by your health care provider. Kegel exercises strengthen the muscles that help control urination, which may help the condition.  Eating and drinking If told by your health care provider, make diet changes, such as: Avoiding caffeine. Drinking fewer fluids, especially alcohol. Not drinking in the evening. Avoiding foods or drinks that may irritate the bladder. These include coffee, tea, soda, artificial sweeteners, citrus, tomato-based foods, and chocolate. Eating foods that help prevent or ease constipation. Constipation can make this condition worse. Your health care provider may recommend that you: Drink enough fluid to keep your urine pale yellow. Take over-the-counter or prescription medicines. Eat foods that are high in fiber, such as beans, whole grains, and fresh fruits and vegetables. Limit foods  that are high in fat and processed sugars, such as fried or sweet foods. General instructions Take over-the-counter and prescription medicines only as told by your health care provider. Keep all follow-up visits as told by your health care provider. This is important. Contact a health care provider if: You start urinating more often. You feel pain or irritation when you urinate. You notice blood in your urine. Your urine looks cloudy. You develop a fever. You begin vomiting. Get help right away if: You are unable to urinate. Summary Urinary frequency means urinating more often than usual. With urinary frequency, you may urinate every 1-2 hours even though you drink a normal amount of fluid and do not have a bladder infection or other bladder condition. Your health care provider may recommend that you keep a bladder diary, follow a bladder training program, or make dietary changes. If told by your health care provider, do Kegel exercises to strengthen the muscles that help control urination. Take over-the-counter and prescription medicines only as told by your health care provider. Contact a health care provider if your symptoms do not improve or get worse. This information is not intended to replace advice given to you by your health care provider. Make sure you discuss any questions you have with your healthcare provider. Document Revised: 02/28/2018 Document Reviewed: 02/28/2018 Elsevier Patient Education  2021 [employer]  

## 2021-06-30 LAB — CMP14+EGFR
ALT: 12 IU/L (ref 0–32)
AST: 17 IU/L (ref 0–40)
Albumin/Globulin Ratio: 2 (ref 1.2–2.2)
Albumin: 4.4 g/dL (ref 3.7–4.7)
Alkaline Phosphatase: 132 IU/L — ABNORMAL HIGH (ref 44–121)
BUN/Creatinine Ratio: 19 (ref 12–28)
BUN: 15 mg/dL (ref 8–27)
Bilirubin Total: 0.4 mg/dL (ref 0.0–1.2)
CO2: 25 mmol/L (ref 20–29)
Calcium: 9.7 mg/dL (ref 8.7–10.3)
Chloride: 102 mmol/L (ref 96–106)
Creatinine, Ser: 0.79 mg/dL (ref 0.57–1.00)
Globulin, Total: 2.2 g/dL (ref 1.5–4.5)
Glucose: 88 mg/dL (ref 70–99)
Potassium: 3.9 mmol/L (ref 3.5–5.2)
Sodium: 143 mmol/L (ref 134–144)
Total Protein: 6.6 g/dL (ref 6.0–8.5)
eGFR: 78 mL/min/{1.73_m2} (ref >59)

## 2021-06-30 LAB — LIPID PANEL
Chol/HDL Ratio: 2.4 ratio (ref 0.0–4.4)
Cholesterol, Total: 149 mg/dL (ref 100–199)
HDL: 61 mg/dL (ref >39)
LDL Chol Calc (NIH): 74 mg/dL (ref 0–99)
Triglycerides: 68 mg/dL (ref 0–149)
VLDL Cholesterol Cal: 14 mg/dL (ref 5–40)

## 2021-06-30 LAB — HEMOGLOBIN A1C
Est. average glucose Bld gHb Est-mCnc: 120 mg/dL
Hgb A1c MFr Bld: 5.8 % — ABNORMAL HIGH (ref 4.8–5.6)

## 2021-06-30 NOTE — Telephone Encounter (Signed)
Spoke with the pt and notified of response per Dr Francine Graven. She wants to hold off on sending Trelegy. She wants to call and speak with her insurance herself because she feels the copay is still too high. I am closing this encounter. She will call back if she changes her mind and wants [place] to send rx.

## 2021-07-04 DIAGNOSIS — J449 Chronic obstructive pulmonary disease, unspecified: Secondary | ICD-10-CM | POA: Diagnosis not present

## 2021-07-04 DIAGNOSIS — I10 Essential (primary) hypertension: Secondary | ICD-10-CM

## 2021-07-15 ENCOUNTER — Telehealth: Payer: Self-pay

## 2021-07-15 ENCOUNTER — Telehealth: Payer: Medicare Other

## 2021-07-15 NOTE — Telephone Encounter (Signed)
  Care Management   Follow Up Note   07/15/2021 Name: Katherine Summers MRN: [id number] DOB: [birth year]   Referred by: Dorothyann Peng, MD Reason for referral : Chronic Care Management (RN CM Follow up call )   An unsuccessful telephone outreach was attempted today. The patient was referred to the case management team for assistance with care management and care coordination.   Follow Up Plan: Telephone follow up appointment with care management team member scheduled for: 08/12/21  Delsa Sale, RN, BSN, CCM Care Management Coordinator [facility] Care Management/[facility]  Direct Phone: [phone]

## 2021-07-26 ENCOUNTER — Telehealth: Payer: Self-pay

## 2021-07-26 NOTE — Chronic Care Management (AMB) (Signed)
Chronic Care Management Pharmacy Assistant   Name: Katherine Summers  MRN: [id number] DOB: October 19, 1945   Reason for Encounter: Disease State/ Hypertension   Recent office visits:  04-14-2021 Katherine Churches, RN (CCM)   05-03-2021 Katherine Summers (CCM)  05-17-2021 Katherine Summers (CCM)  06-06-2021 Katherine Summers (CCM)  06-20-2021 Katherine Summers (CCM)  06-29-2021 Katherine Ivory, NP. Alkaline= 132. A1C= 5.8. Referral laced to urology.  Recent consult visits:  04-13-2021 Martina Sinner, MD (Pulmonary). Pulmonary function test performed. STOP myrbetriq. START anoro ellipta 1 puff daily. CT chest high resolution ordered.  Hospital visits:  None in previous 6 months  Medications: Outpatient Encounter Medications as of 07/26/2021  Medication Sig   amLODipine (NORVASC) 5 MG tablet TAKE 1 TABLET(5 MG) BY MOUTH DAILY   atorvastatin (LIPITOR) 20 MG tablet TAKE 1 TABLET BY MOUTH EVERY DAY   cromolyn (OPTICROM) 4 % ophthalmic solution Place 1 drop into both eyes 2 (two) times daily.   escitalopram (LEXAPRO) 10 MG tablet TAKE 1 TABLET(10 MG) BY MOUTH DAILY   lisinopril-hydrochlorothiazide (ZESTORETIC) 20-12.5 MG tablet TAKE 1 TABLET BY MOUTH DAILY   Melatonin 2.5 MG CAPS One at bedtime po   naproxen (NAPROSYN) 500 MG tablet Take 1 tablet by mouth every 12 (twelve) hours as needed.    neomycin-polymyxin b-dexamethasone (MAXITROL) 3.5-10000-0.1 OINT SMARTSIG:1 Inch(es) In Eye(s) Every Night   nystatin (NYSTATIN) powder Apply topically 4 (four) times daily.   OXYBUTYNIN CHLORIDE PO    Polyethylene Glycol 3350 (PEG 3350 PO) See admin instructions.   prednisoLONE acetate (PRED FORTE) 1 % ophthalmic suspension    umeclidinium-vilanterol (ANORO ELLIPTA) 62.5-25 MCG/INH AEPB Inhale 1 puff into the lungs daily.   VITAMIN D PO Take 5,000 mg by mouth. Take 1 daily mon through fri   No facility-administered encounter medications on file as of 07/26/2021.   Reviewed chart prior to disease  state call. Spoke with patient regarding BP  Recent Office Vitals: BP Readings from Last 3 Encounters:  06/29/21 130/72  04/13/21 128/74  12/22/20 138/80   Pulse Readings from Last 3 Encounters:  06/29/21 70  04/13/21 72  12/22/20 73    Wt Readings from Last 3 Encounters:  06/29/21 176 lb (79.8 kg)  04/13/21 168 lb (76.2 kg)  12/22/20 178 lb 6.4 oz (80.9 kg)     Kidney Function Lab Results  Component Value Date/Time   CREATININE 0.79 06/29/2021 12:46 PM   CREATININE 0.76 11/17/2020 10:24 AM   GFRNONAA 68 05/20/2020 09:55 AM   GFRAA 79 05/20/2020 09:55 AM    BMP Latest Ref Rng & Units 06/29/2021 11/17/2020 05/20/2020  Glucose 70 - 99 mg/dL 88 845(X) 646(O)  BUN 8 - 27 mg/dL 15 17 12   Creatinine 0.57 - 1.00 mg/dL 0.32 1.22  BUN/Creat Ratio 12 - 28 19 22 14   Sodium 134 - 144 mmol/L 143 137 147(H)  Potassium 3.5 - 5.2 mmol/L 3.9 3.4(L) 3.7  Chloride 96 - 106 mmol/L 102 97 106  CO2 20 - 29 mmol/L 25 23 26   Calcium 8.7 - 10.3 mg/dL 9.7 9.7 9.8    Current antihypertensive regimen:  Amlodipine 5 mg daily Lisinopril - Hydrochlorothiazide 20-12.5 mg daily  How often are you checking your Blood Pressure? Patient states she doesn't check blood pressure at home.  Current home BP readings: None  What recent interventions/DTPs have been made by any provider to improve Blood Pressure control since last CPP Visit:  Educated on Importance of home blood pressure monitoring; Proper BP  monitoring technique; Symptoms of hypotension and importance of maintaining adequate hydration; -Counseled to monitor BP at home at least three times per week, document, and provide log at future appointments -Recommended to continue current medication  Any recent hospitalizations or ED visits since last visit with CPP? No  What diet changes have been made to improve Blood Pressure Control?  Patient states she has limited her salt intake and tries to drink water daily.  What exercise is being  done to improve your Blood Pressure Control?  Patient states she cleans around the house.  Adherence Review: Is the patient currently on ACE/ARB medication? Yes Does the patient have >5 day gap between last estimated fill dates? No  NOTES: Patient states she may have had the flu and has been sick for the past few weeks. Patient started feeling better yesterday but continues to rest and stay hydrated.  Care Gaps: Shingrix overdue Yearly Ophthalmology overdue PNA Vac overdue 3rd moderna vaccine overdue Yearly foot exam overdue Next AWV 01-04-2022  Star Rating Drugs: Atorvastatin 20 mg- Last filled 06-27-2021 90 DS [facility] Lisinopril/ HCTZ 20- 12.5 mg- Last filled 06-03-2021 90 DS [facility]  [facility] CMA Clinical Pharmacist Assistant [phone]

## 2021-08-12 ENCOUNTER — Telehealth: Payer: Medicare Other

## 2021-09-01 ENCOUNTER — Other Ambulatory Visit: Payer: Self-pay | Admitting: Internal Medicine

## 2021-09-09 IMAGING — CT CT CHEST HIGH RESOLUTION W/O CM
2 of 7 series · 13 of 36 positions shown, 16 images · non-contrast
Comparison: No priors.
COMPARISON: No priors.

Addendum:
CLINICAL DATA: 75-year-old female with history of COPD. Chronic
shortness of breath and dry cough. Evaluate for interstitial lung
disease.

EXAM:
CT CHEST WITHOUT CONTRAST
TECHNIQUE: Multidetector CT imaging of the chest was performed following the
standard protocol without intravenous contrast. High resolution
imaging of the lungs, as well as inspiratory and expiratory imaging,
was performed.

[Series 4: high resolution · axial · 0.61mm/px · z∈[-242,-34]mm · 10 of 249 slices shown, 13 images]
[im 21/249  mediastinal]
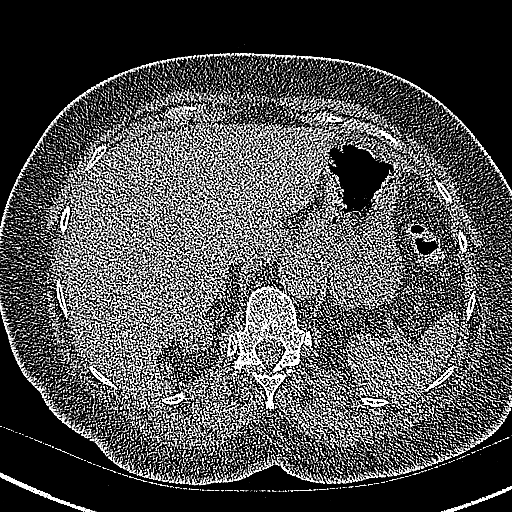
[im 21/249  lung]
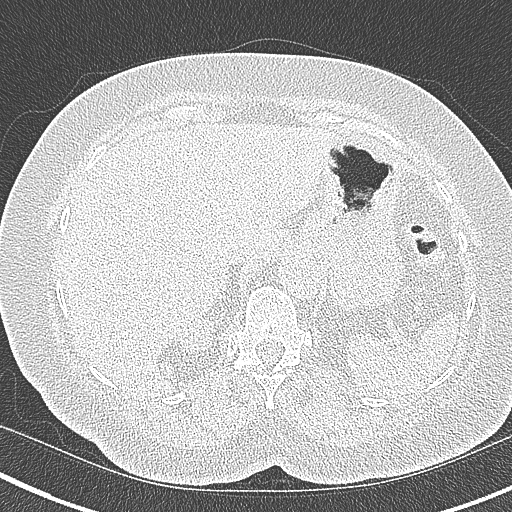
[im 42/249  lung]
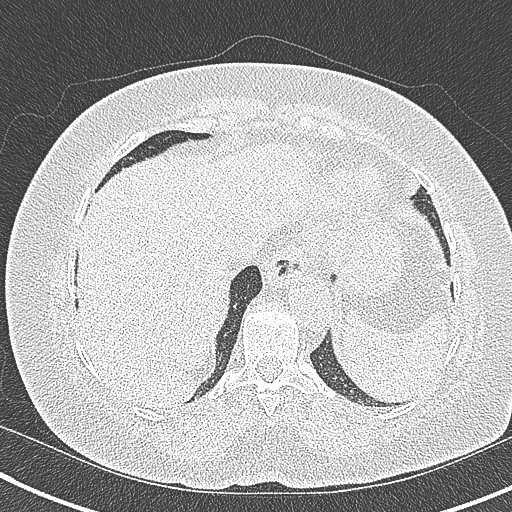
[im 63/249  lung]
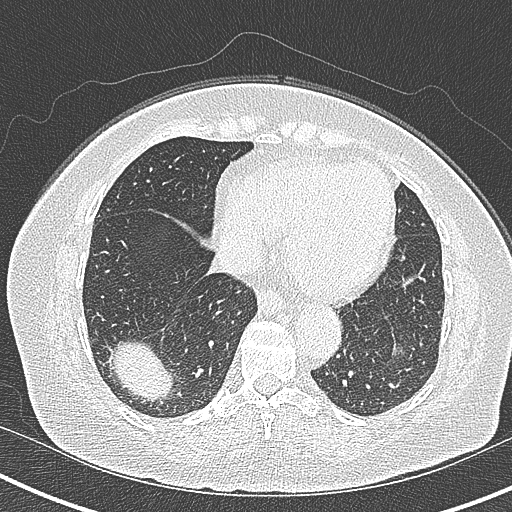
[im 83/249  lung]
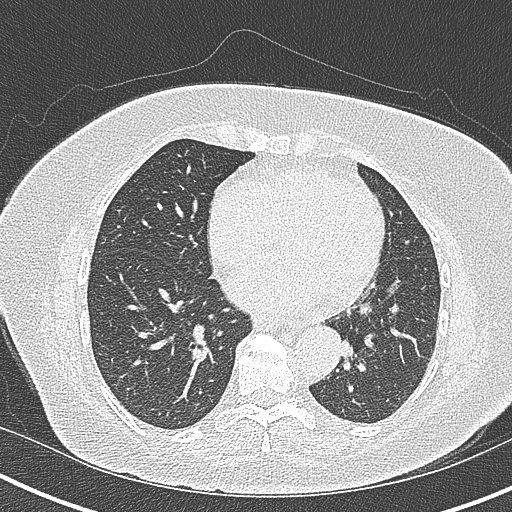
[im 104/249  mediastinal]
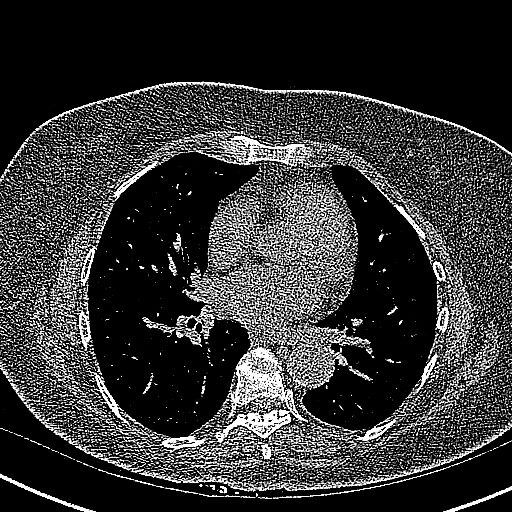
[im 104/249  lung]
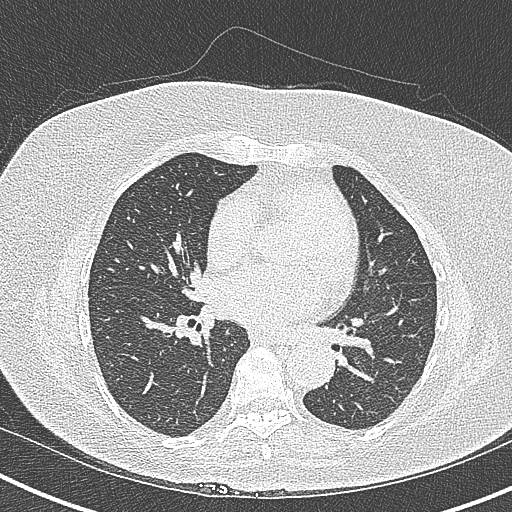
[im 145/249  lung]
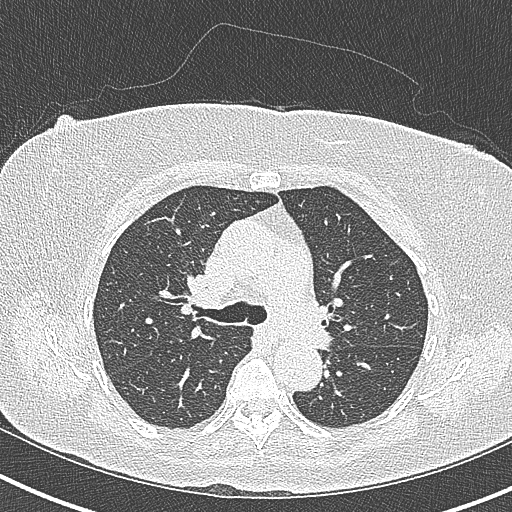
[im 166/249  lung]
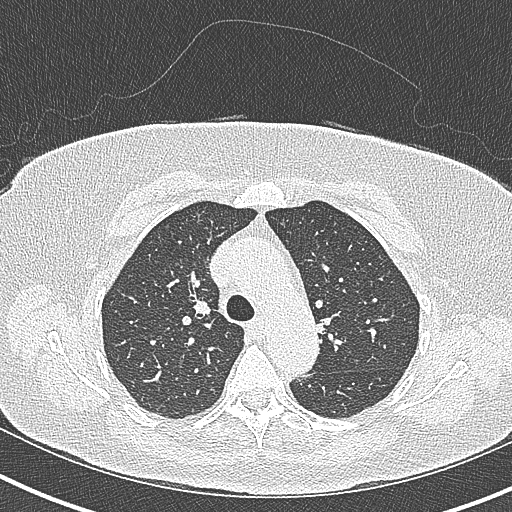
[im 187/249  lung]
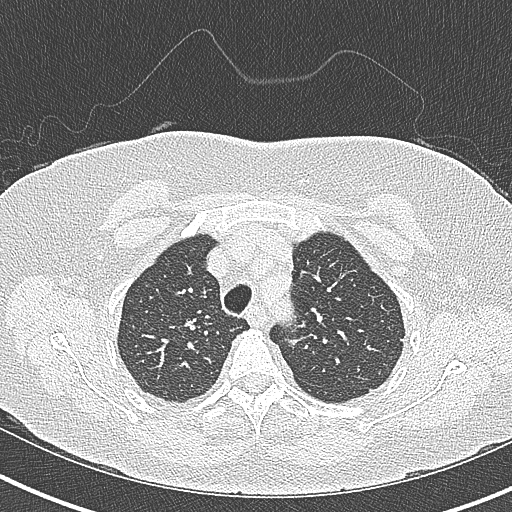
[im 207/249  mediastinal]
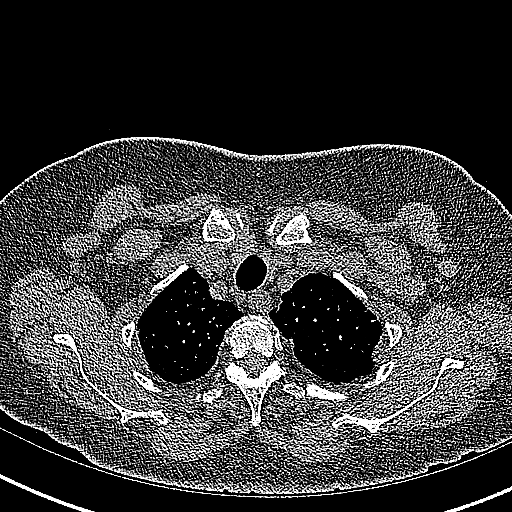
[im 207/249  lung]
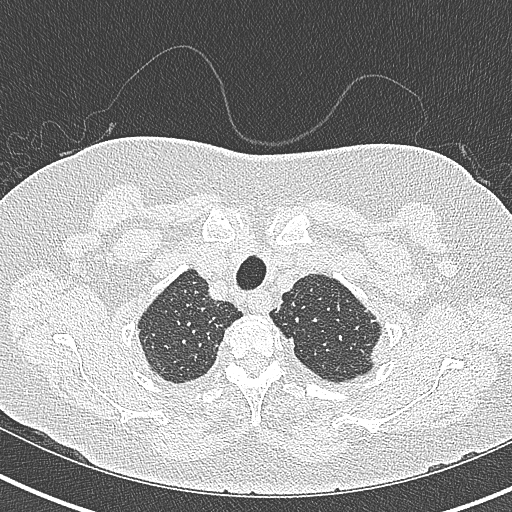
[im 228/249  lung]
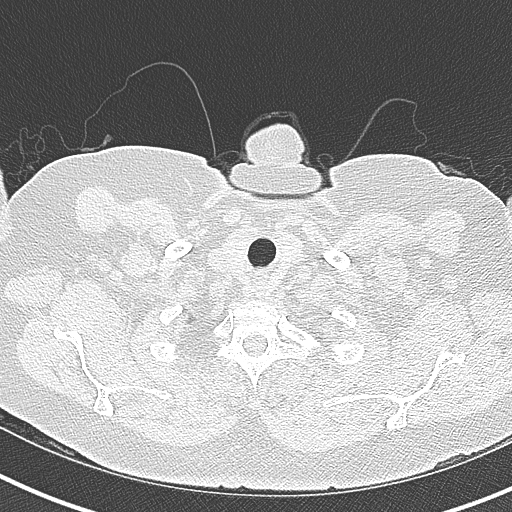

[Series 6: coronal · coronal · 0.51mm/px · 3 of 106 slices shown]
[im 22/106  lung]
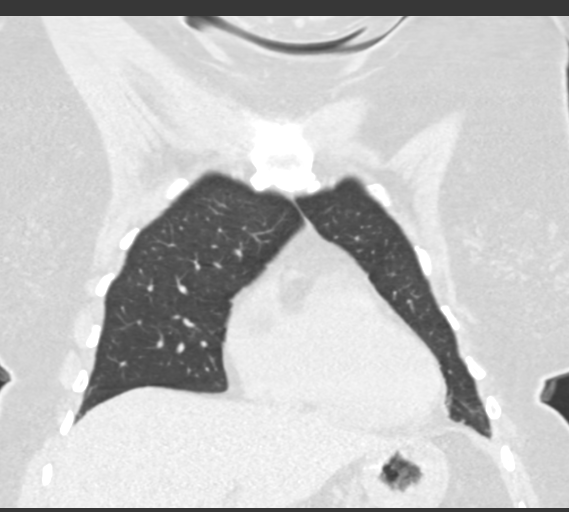
[im 43/106  lung]
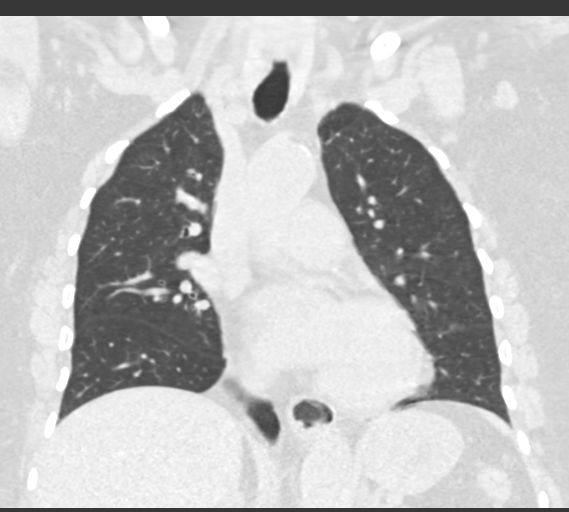
[im 64/106  lung]
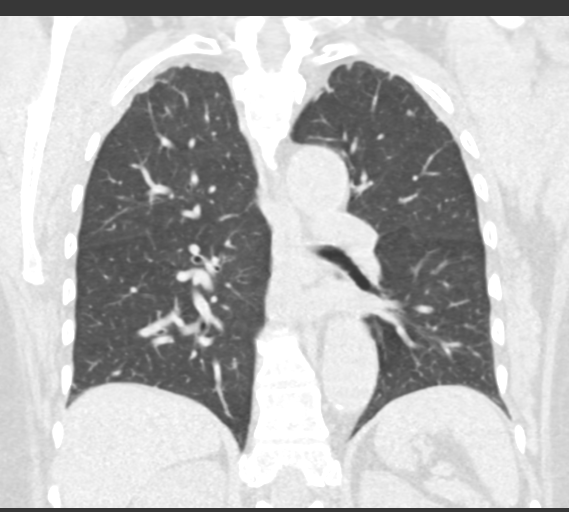

[13 of 36 positions shown; findings below may reference images not displayed]

FINDINGS: Cardiovascular: Heart size is normal. There is no significant
pericardial fluid, thickening or pericardial calcification. There is
aortic atherosclerosis, as well as atherosclerosis of the great
vessels of the mediastinum and the coronary arteries, including
calcified atherosclerotic plaque in the left main and left anterior
descending coronary arteries.

Mediastinum/Nodes: No pathologically enlarged mediastinal or hilar
lymph nodes. Please note that accurate exclusion of hilar adenopathy
is limited on noncontrast CT scans. Small hiatal hernia. Multiple
prominent borderline enlarged axillary lymph nodes bilaterally, as
well as mildly enlarged right axillary lymph node measuring up to
1.4 cm in short axis (axial image 22 of series 2).

Lungs/Pleura: High-resolution images demonstrate no significant
regions of ground-glass attenuation, septal thickening, subpleural
reticulation, parenchymal banding, traction bronchiectasis or frank
honeycombing to indicate interstitial lung disease. Inspiratory and
expiratory imaging demonstrates mild air trapping indicative of mild
small airways disease. No acute consolidative airspace disease. No
pleural effusions. Mild bilateral apical pleuroparenchymal
thickening and nodular architectural distortion, most compatible
with areas of chronic post infectious or inflammatory scarring.

Upper Abdomen: Aortic atherosclerosis. Colonic diverticulosis noted
in the sigmoid colon.

Musculoskeletal: There are no aggressive appearing lytic or blastic
lesions noted in the visualized portions of the skeleton.
IMPRESSION: 1. No findings to suggest interstitial lung disease.
2. Mild air trapping indicative of mild small airways disease.
3. Aortic atherosclerosis, in addition to left main and left
anterior descending coronary artery disease. Assessment for
potential risk factor modification, dietary therapy or pharmacologic
therapy may be warranted, if clinically indicated.
4. Small hiatal hernia.
5. Colonic diverticulosis.

Aortic Atherosclerosis (4BRVF-MP1.1).

ADDENDUM:
Borderline enlarged and mildly enlarged lymph nodes in the axillary
regions bilaterally (right greater than left). This is of uncertain
etiology and significance. However, correlation with physical
examination and mammography is recommended in the near future.

*** End of Addendum ***
FINDINGS: Cardiovascular: Heart size is normal. There is no significant
pericardial fluid, thickening or pericardial calcification. There is
aortic atherosclerosis, as well as atherosclerosis of the great
vessels of the mediastinum and the coronary arteries, including
calcified atherosclerotic plaque in the left main and left anterior
descending coronary arteries.

Mediastinum/Nodes: No pathologically enlarged mediastinal or hilar
lymph nodes. Please note that accurate exclusion of hilar adenopathy
is limited on noncontrast CT scans. Small hiatal hernia. Multiple
prominent borderline enlarged axillary lymph nodes bilaterally, as
well as mildly enlarged right axillary lymph node measuring up to
1.4 cm in short axis (axial image 22 of series 2).

Lungs/Pleura: High-resolution images demonstrate no significant
regions of ground-glass attenuation, septal thickening, subpleural
reticulation, parenchymal banding, traction bronchiectasis or frank
honeycombing to indicate interstitial lung disease. Inspiratory and
expiratory imaging demonstrates mild air trapping indicative of mild
small airways disease. No acute consolidative airspace disease. No
pleural effusions. Mild bilateral apical pleuroparenchymal
thickening and nodular architectural distortion, most compatible
with areas of chronic post infectious or inflammatory scarring.

Upper Abdomen: Aortic atherosclerosis. Colonic diverticulosis noted
in the sigmoid colon.

Musculoskeletal: There are no aggressive appearing lytic or blastic
lesions noted in the visualized portions of the skeleton.
IMPRESSION: 1. No findings to suggest interstitial lung disease.
2. Mild air trapping indicative of mild small airways disease.
3. Aortic atherosclerosis, in addition to left main and left
anterior descending coronary artery disease. Assessment for
potential risk factor modification, dietary therapy or pharmacologic
therapy may be warranted, if clinically indicated.
4. Small hiatal hernia.
5. Colonic diverticulosis.

Aortic Atherosclerosis (4BRVF-MP1.1).

## 2021-09-15 ENCOUNTER — Other Ambulatory Visit: Payer: Self-pay | Admitting: Internal Medicine

## 2021-09-21 ENCOUNTER — Telehealth: Payer: Self-pay

## 2021-09-21 NOTE — Chronic Care Management (AMB) (Signed)
° ° °  Called Katherine Summers, No answer, left message of appointment on 09-27-2021 at 12:00 via telephone visit with Cherylin Mylar, Pharm D. Notified to have all medications, supplements, blood pressure and/or blood sugar logs available during appointment and to return call if need to reschedule.   Care Gaps: Shingrix overdue Yearly ophthalmology overdue PNA Vac overdue Yearly foot exam overdue  Star Rating Drug: Lisinopril/ HCTZ 20- 12.5 mg- Last filled 08-01-2021 90 DS [facility] Atorvastatin 20 mg- Last filled 06-27-2021 90 [facility]  Any gaps in medications fill history? No  Huey Romans [facility] Clinical Pharmacist Assistant [phone]

## 2021-09-27 ENCOUNTER — Telehealth: Payer: Medicare Other

## 2021-09-30 ENCOUNTER — Telehealth: Payer: Self-pay

## 2021-09-30 NOTE — Chronic Care Management (AMB) (Signed)
° ° °  Chronic Care Management Pharmacy Assistant   Name: Katherine Summers  MRN: [id number] DOB: 09/27/1945  Reason for Encounter: Disease State/ Hypertension   Recent office visits:  None  Recent consult visits:  09-06-2021 Charna Elizabeth, MD (Gastroenterology). "Discussed the importance of slowly adopting a high fiber diet, aiming for a recommended consumption of 20-25 gms of fiber on a daily basis with liberal fluid intake. The patient was provided with a fiber tracking sheet".  Hospital visits:  None in previous 6 months  Medications: Outpatient Encounter Medications as of 09/30/2021  Medication Sig   amLODipine (NORVASC) 5 MG tablet TAKE 1 TABLET(5 MG) BY MOUTH DAILY   atorvastatin (LIPITOR) 20 MG tablet TAKE 1 TABLET BY MOUTH EVERY DAY   cromolyn (OPTICROM) 4 % ophthalmic solution Place 1 drop into both eyes 2 (two) times daily.   escitalopram (LEXAPRO) 10 MG tablet TAKE 1 TABLET(10 MG) BY MOUTH DAILY   lisinopril-hydrochlorothiazide (ZESTORETIC) 20-12.5 MG tablet TAKE 1 TABLET BY MOUTH DAILY   Melatonin 2.5 MG CAPS One at bedtime po   naproxen (NAPROSYN) 500 MG tablet Take 1 tablet by mouth every 12 (twelve) hours as needed.    neomycin-polymyxin b-dexamethasone (MAXITROL) 3.5-10000-0.1 OINT SMARTSIG:1 Inch(es) In Eye(s) Every Night   nystatin (NYSTATIN) powder Apply topically 4 (four) times daily.   OXYBUTYNIN CHLORIDE PO    Polyethylene Glycol 3350 (PEG 3350 PO) See admin instructions.   prednisoLONE acetate (PRED FORTE) 1 % ophthalmic suspension    umeclidinium-vilanterol (ANORO ELLIPTA) 62.5-25 MCG/INH AEPB Inhale 1 puff into the lungs daily.   VITAMIN D PO Take 5,000 mg by mouth. Take 1 daily mon through fri   No facility-administered encounter medications on file as of 09/30/2021.  Reviewed chart prior to disease state call.   Recent Office Vitals: BP Readings from Last 3 Encounters:  06/29/21 130/72  04/13/21 128/74  12/22/20 138/80   Pulse Readings from Last 3  Encounters:  06/29/21 70  04/13/21 72  12/22/20 73    Wt Readings from Last 3 Encounters:  06/29/21 176 lb (79.8 kg)  04/13/21 168 lb (76.2 kg)  12/22/20 178 lb 6.4 oz (80.9 kg)     Kidney Function Lab Results  Component Value Date/Time   CREATININE 0.79 06/29/2021 12:46 PM   CREATININE 0.76 11/17/2020 10:24 AM   GFRNONAA 68 05/20/2020 09:55 AM   GFRAA 79 05/20/2020 09:55 AM    BMP Latest Ref Rng & Units 06/29/2021 11/17/2020 05/20/2020  Glucose 70 - 99 mg/dL 88 250(N) 397(Q)  BUN 8 - 27 mg/dL 15 17 12   Creatinine 0.57 - 1.00 mg/dL 7.34 1.93  BUN/Creat Ratio 12 - 28 19 22 14   Sodium 134 - 144 mmol/L 143 137 147(H)  Potassium 3.5 - 5.2 mmol/L 3.9 3.4(L) 3.7  Chloride 96 - 106 mmol/L 102 97 106  CO2 20 - 29 mmol/L 25 23 26   Calcium 8.7 - 10.3 mg/dL 9.7 9.7 9.8    7.90: 1st attempt left VM 10-03-2021: 2nd attempt left VM 10-04-2021: 3rd attempt left VM  Care Gaps: Shingrix overdue Yearly foot exam overdue PNA Vac overdue 3rd moderna vaccine overdue Next AWV 01-04-2022  Star Rating Drugs: Atorvastatin 20 mg- Last filled 09-15-2021 90 DS [facility] Lisinopril/ HCTZ 20- 12.5 mg- Last filled 08-01-2021 90 DS [facility]  [facility]cca [facility] CMA Clinical Pharmacist Assistant [phone]

## 2021-10-05 ENCOUNTER — Ambulatory Visit: Payer: Medicare Other | Admitting: Internal Medicine

## 2021-10-10 ENCOUNTER — Telehealth: Payer: Self-pay

## 2021-10-10 ENCOUNTER — Telehealth: Payer: Medicare Other

## 2021-10-10 NOTE — Telephone Encounter (Signed)
°  Care Management   Follow Up Note   10/10/2021 Name: Katherine Summers MRN: [id number] DOB: 1946/01/30   Referred by: Dorothyann Peng, MD Reason for referral : Chronic Care Management (RN CM Follow up call - #2 attempt )   An unsuccessful telephone outreach was attempted today. The patient was referred to the case management team for assistance with care management and care coordination.   Follow Up Plan: Telephone follow up appointment with care management team member scheduled for: 10/20/21  Delsa Sale, RN, BSN, CCM Care Management Coordinator [facility] Care Management/[facility]  Direct Phone: [phone]

## 2021-10-10 NOTE — Chronic Care Management (AMB) (Signed)
° ° °  Chronic Care Management Pharmacy Assistant   Name: TAKYRA CANTRALL  MRN: [id number] DOB: [birth year]  Reason for Encounter: Disease State/ Hypertension  Recent office visits:  None  Recent consult visits:  09-06-2021 Charna Elizabeth, MD (Gastroenterology). "Discussed the importance of slowly adopting a high fiber diet, aiming for a recommended consumption of 20-25 gms of fiber on a daily basis with liberal fluid intake. The patient was provided with a fiber tracking sheet".  Hospital visits:  None in previous 6 months  Medications: Outpatient Encounter Medications as of 10/10/2021  Medication Sig   amLODipine (NORVASC) 5 MG tablet TAKE 1 TABLET(5 MG) BY MOUTH DAILY   atorvastatin (LIPITOR) 20 MG tablet TAKE 1 TABLET BY MOUTH EVERY DAY   cromolyn (OPTICROM) 4 % ophthalmic solution Place 1 drop into both eyes 2 (two) times daily.   escitalopram (LEXAPRO) 10 MG tablet TAKE 1 TABLET(10 MG) BY MOUTH DAILY   lisinopril-hydrochlorothiazide (ZESTORETIC) 20-12.5 MG tablet TAKE 1 TABLET BY MOUTH DAILY   Melatonin 2.5 MG CAPS One at bedtime po   naproxen (NAPROSYN) 500 MG tablet Take 1 tablet by mouth every 12 (twelve) hours as needed.    neomycin-polymyxin b-dexamethasone (MAXITROL) 3.5-10000-0.1 OINT SMARTSIG:1 Inch(es) In Eye(s) Every Night   nystatin (NYSTATIN) powder Apply topically 4 (four) times daily.   OXYBUTYNIN CHLORIDE PO    Polyethylene Glycol 3350 (PEG 3350 PO) See admin instructions.   prednisoLONE acetate (PRED FORTE) 1 % ophthalmic suspension    umeclidinium-vilanterol (ANORO ELLIPTA) 62.5-25 MCG/INH AEPB Inhale 1 puff into the lungs daily.   VITAMIN D PO Take 5,000 mg by mouth. Take 1 daily mon through fri   No facility-administered encounter medications on file as of 10/10/2021.  Reviewed chart prior to disease state call. Spoke with patient regarding BP  Recent Office Vitals: BP Readings from Last 3 Encounters:  06/29/21 130/72  04/13/21 128/74  12/22/20 138/80   Pulse  Readings from Last 3 Encounters:  06/29/21 70  04/13/21 72  12/22/20 73    Wt Readings from Last 3 Encounters:  06/29/21 176 lb (79.8 kg)  04/13/21 168 lb (76.2 kg)  12/22/20 178 lb 6.4 oz (80.9 kg)     Kidney Function Lab Results  Component Value Date/Time   CREATININE 0.79 06/29/2021 12:46 PM   CREATININE 0.76 11/17/2020 10:24 AM   GFRNONAA 68 05/20/2020 09:55 AM   GFRAA 79 05/20/2020 09:55 AM    BMP Latest Ref Rng & Units 06/29/2021 11/17/2020 05/20/2020  Glucose 70 - 99 mg/dL 88 762(G) 315(V)  BUN 8 - 27 mg/dL 15 17 12   Creatinine 0.57 - 1.00 mg/dL 7.61 6.07  BUN/Creat Ratio 12 - 28 19 22 14   Sodium 134 - 144 mmol/L 143 137 147(H)  Potassium 3.5 - 5.2 mmol/L 3.9 3.4(L) 3.7  Chloride 96 - 106 mmol/L 102 97 106  CO2 20 - 29 mmol/L 25 23 26   Calcium 8.7 - 10.3 mg/dL 9.7 9.7 9.8    3.71: 1st attempt left VM 10-20-2021: 2nd attempt left VM 10-21-2021: 3rd attempt left VM  Care Gaps: Shingrix overdue Yearly foot exam overdue PNA Vac overdue 3rd moderna vaccine overdue Next AWV 01-04-2022  Star Rating Drugs: Atorvastatin 20 mg- Last filled 09-15-2021 90 DS [facility] Lisinopril/ HCTZ 20- 12.5 mg- Last filled 08-01-2021 90 DS [facility]  Malecca [facility] CMA Clinical Pharmacist Assistant [phone]

## 2021-10-20 ENCOUNTER — Telehealth: Payer: Medicare Other

## 2021-10-20 ENCOUNTER — Ambulatory Visit (INDEPENDENT_AMBULATORY_CARE_PROVIDER_SITE_OTHER): Payer: Medicare Other

## 2021-10-20 DIAGNOSIS — R7303 Prediabetes: Secondary | ICD-10-CM

## 2021-10-20 DIAGNOSIS — I10 Essential (primary) hypertension: Secondary | ICD-10-CM

## 2021-10-20 DIAGNOSIS — J449 Chronic obstructive pulmonary disease, unspecified: Secondary | ICD-10-CM

## 2021-10-20 DIAGNOSIS — M79671 Pain in right foot: Secondary | ICD-10-CM

## 2021-10-20 DIAGNOSIS — N3281 Overactive bladder: Secondary | ICD-10-CM

## 2021-10-20 DIAGNOSIS — M79672 Pain in left foot: Secondary | ICD-10-CM

## 2021-10-21 NOTE — Chronic Care Management (AMB) (Signed)
Chronic Care Management   CCM RN Visit Note  10/20/2021 Name: Katherine Summers MRN: [id number] DOB: 10-Apr-1946  Subjective: Katherine Summers is a 76 y.o. year old female who is a primary care patient of Glendale Chard, MD. The care management team was consulted for assistance with disease management and care coordination needs.    Engaged with patient by telephone for follow up visit in response to provider referral for case management and/or care coordination services.   Consent to Services:  The patient was given information about Chronic Care Management services, agreed to services, and gave verbal consent prior to initiation of services.  Please see initial visit note for detailed documentation.   Patient agreed to services and verbal consent obtained.   Assessment: Review of patient past medical history, allergies, medications, health status, including review of consultants reports, laboratory and other test data, was performed as part of comprehensive evaluation and provision of chronic care management services.   SDOH (Social Determinants of Health) assessments and interventions performed:  Yes, no acute needs   CCM Care Plan  No Known Allergies  Outpatient Encounter Medications as of 10/20/2021  Medication Sig   amLODipine (NORVASC) 5 MG tablet TAKE 1 TABLET(5 MG) BY MOUTH DAILY   atorvastatin (LIPITOR) 20 MG tablet TAKE 1 TABLET BY MOUTH EVERY DAY   cromolyn (OPTICROM) 4 % ophthalmic solution Place 1 drop into both eyes 2 (two) times daily.   escitalopram (LEXAPRO) 10 MG tablet TAKE 1 TABLET(10 MG) BY MOUTH DAILY   lisinopril-hydrochlorothiazide (ZESTORETIC) 20-12.5 MG tablet TAKE 1 TABLET BY MOUTH DAILY   Melatonin 2.5 MG CAPS One at bedtime po   naproxen (NAPROSYN) 500 MG tablet Take 1 tablet by mouth every 12 (twelve) hours as needed.    neomycin-polymyxin b-dexamethasone (MAXITROL) 3.5-10000-0.1 OINT SMARTSIG:1 Inch(es) In Eye(s) Every Night   nystatin (NYSTATIN) powder  Apply topically 4 (four) times daily.   OXYBUTYNIN CHLORIDE PO    Polyethylene Glycol 3350 (PEG 3350 PO) See admin instructions.   prednisoLONE acetate (PRED FORTE) 1 % ophthalmic suspension    umeclidinium-vilanterol (ANORO ELLIPTA) 62.5-25 MCG/INH AEPB Inhale 1 puff into the lungs daily.   VITAMIN D PO Take 5,000 mg by mouth. Take 1 daily mon through fri   No facility-administered encounter medications on file as of 10/20/2021.    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   Family history of malignant neoplasm of gastrointestinal tract 10/05/2020   Personal history of colonic polyps 10/05/2020   Impairment of balance 12/18/2019   Muscle weakness 12/18/2019   Urinary frequency 10/31/2018   Prediabetes 10/31/2018   OSA on CPAP 07/22/2018   CPAP use counseling 07/22/2018   Abnormal dreams 07/22/2018   Chronic obstructive pulmonary disease ([place]) 03/21/2018   Other fatigue 03/21/2018   Snorings 03/21/2018   Nocturia more than twice per night 03/21/2018   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   Pain in right knee 01/04/2018   Overweight (BMI 25.0-29.9) 11/01/2016   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   Murmur 08/01/2016   Shortness of breath 08/01/2016   Abnormal EKG 08/01/2016   Essential hypertension 08/01/2016   Depression 08/01/2016    Conditions to be addressed/monitored: HTN, COPD, Prediabetes, Overactive bladder, Foot pain, bilateral   Care Plan : RN Care Manager Plan of Care  Updates made by Seyed Heffley,  Claudette Stapler, RN since 10/20/2021 12:00 AM     Problem: No plan of care established for management of chronic disease states (HTN, COPD, Prediabetes, Overactive bladder, Foot pain, bilateral)   Priority: High     Long-Range Goal: Establishment of plan of care for management of  chronic disease states (HTN, COPD, Prediabetes, Overactive bladder, Foot pain, bilateral)   Start Date: 10/20/2021  Expected End Date: 10/20/2022  This Visit's Progress: On track  Priority: High  Note:   Current Barriers:  Knowledge Deficits related to plan of care for management of HTN, COPD, Prediabetes, Overactive bladder, Foot pain, bilateral   Chronic Disease Management support and education needs related to HTN, COPD, Prediabetes, Overactive bladder, Foot pain, bilateral    RNCM Clinical Goal(s):  Patient will verbalize basic understanding of  HTN, COPD, Prediabetes, Overactive bladder, Foot pain, bilateral  disease process and self health management plan as evidenced by patient will report having no disease exacerbation related to her chronic disease states as listed above  take all medications exactly as prescribed and will call provider for medication related questions as evidenced by will demonstrate improved understanding of prescribed medications and rationale for usage as evidenced by patient teach back demonstrate Improved health management independence as evidenced by patient will report 100% adherence to her prescribed treatment plan  continue to work with RN Care Manager to address care management and care coordination needs related to  HTN, COPD, Prediabetes, Overactive bladder, Foot pain, bilateral  as evidenced by adherence to CM Team Scheduled appointments demonstrate ongoing self health care management ability   as evidenced by    through collaboration with RN Care manager, provider, and care team.   Interventions: 1:1 collaboration with primary care provider regarding development and update of comprehensive plan of care as evidenced by provider attestation and co-signature Inter-disciplinary care team collaboration (see longitudinal plan of care) Evaluation of current treatment plan related to  self management and patient's adherence to plan as established by provider  COPD  Interventions:  (Status:  Goal on track:  Yes.) Long Term Goal Provided patient with basic written and verbal COPD education on self care/management/and exacerbation prevention Advised patient to track and manage COPD triggers Provided written and verbal instructions on pursed lip breathing and utilized returned demonstration as teach back Provided instruction about proper use of medications used for management of COPD including inhalers Advised patient to self assesses COPD action plan zone and make appointment with provider if in the yellow zone for 48 hours without improvement Advised patient to engage in light exercise as tolerated 3-5 days a week to aid in the the management of COPD Discussed the importance of adequate rest and management of fatigue with COPD Mailed printed educational materials related to How to Perform Chair Exercises Discussed plans with patient for ongoing care management follow up and provided patient with direct contact information for care management team  Diabetes Interventions:  (Status:  Goal on track:  Yes.) Long Term Goal Assessed patient's understanding of A1c goal:  <5.7 Provided education to patient about basic DM disease process Reviewed medications with patient and discussed importance of medication adherence Counseled on importance of regular laboratory monitoring as prescribed Review of patient status, including review of consultants reports, relevant laboratory and other test results, and medications completed Discussed plans with patient for ongoing care management follow up and provided patient with direct contact information for care management team Lab Results  Component Value Date   HGBA1C 5.8 (H) 06/29/2021  Impaired Urinary Elimination:  (Status:  Goal on track:  Yes.)  Long Term Goal Evaluation of current treatment plan related to  Impaired Urinary Elimination , self-management and patient's adherence to plan as established by  provider Determined patient continues to have her overactive bladder treated by [facility] Reviewed medications with patient and discussed importance of medication adherence Reviewed and discussed patient understanding of treatment recommendations and MD follow up, determined patient will undergo a bladder scan at her next visit  Discussed plans with patient for ongoing care management follow up and provided patient with direct contact information for care management team  Hypertension Interventions:  (Status:  Goal on track:  Yes.) Long Term Goal Last practice recorded BP readings:  BP Readings from Last 3 Encounters:  06/29/21 130/72  04/13/21 128/74  12/22/20 138/80  Most recent eGFR/CrCl:  Lab Results  Component Value Date   EGFR 78 06/29/2021    No components found for: CRCL Evaluation of current treatment plan related to hypertension self management and patient's adherence to plan as established by provider Reviewed medications with patient and discussed importance of compliance Counseled on the importance of exercise goals with target of 150 minutes per week Advised patient, providing education and rationale, to monitor blood pressure daily and record, calling PCP for findings outside established parameters Provided education on prescribed diet low sodium Assessed social determinant of health barriers Mailed printed educational materials related to How to Limit Sodium; How to accurately monitor BP at home Discussed plans with patient for ongoing care management follow up and provided patient with direct contact information for care management team  Patient Goals/Self-Care Activities: Take all medications as prescribed Attend all scheduled provider appointments Call pharmacy for medication refills 3-7 days in advance of running out of medications Perform all self care activities independently  Call provider office for new concerns or questions  drink 6 to 8 glasses of  water each day manage portion size limit outdoor activity during cold weather do breathing exercises at least 2 times each day check blood pressure 3 times per week call doctor for signs and symptoms of high blood pressure keep all doctor appointments take medications for blood pressure exactly as prescribed report new symptoms to your doctor  Follow Up Plan:  Telephone follow up appointment with care management team member scheduled for:  11/23/21       Barb Merino, RN, BSN, CCM Care Management Coordinator [place] [facility]  Direct Phone: [phone]

## 2021-10-26 ENCOUNTER — Telehealth: Payer: Self-pay

## 2021-10-26 NOTE — Chronic Care Management (AMB) (Signed)
° ° °  Chronic Care Management Pharmacy Assistant   Name: Katherine Summers  MRN: [id number] DOB: 19-Sep-1945   Reason for Encounter: Ailene Ards ellipta PAP   Medications: Outpatient Encounter Medications as of 10/26/2021  Medication Sig   amLODipine (NORVASC) 5 MG tablet TAKE 1 TABLET(5 MG) BY MOUTH DAILY   atorvastatin (LIPITOR) 20 MG tablet TAKE 1 TABLET BY MOUTH EVERY DAY   cromolyn (OPTICROM) 4 % ophthalmic solution Place 1 drop into both eyes 2 (two) times daily.   escitalopram (LEXAPRO) 10 MG tablet TAKE 1 TABLET(10 MG) BY MOUTH DAILY   lisinopril-hydrochlorothiazide (ZESTORETIC) 20-12.5 MG tablet TAKE 1 TABLET BY MOUTH DAILY   Melatonin 2.5 MG CAPS One at bedtime po   naproxen (NAPROSYN) 500 MG tablet Take 1 tablet by mouth every 12 (twelve) hours as needed.    neomycin-polymyxin b-dexamethasone (MAXITROL) 3.5-10000-0.1 OINT SMARTSIG:1 Inch(es) In Eye(s) Every Night   nystatin (NYSTATIN) powder Apply topically 4 (four) times daily.   OXYBUTYNIN CHLORIDE PO    Polyethylene Glycol 3350 (PEG 3350 PO) See admin instructions.   prednisoLONE acetate (PRED FORTE) 1 % ophthalmic suspension    umeclidinium-vilanterol (ANORO ELLIPTA) 62.5-25 MCG/INH AEPB Inhale 1 puff into the lungs daily.   VITAMIN D PO Take 5,000 mg by mouth. Take 1 daily mon through fri   No facility-administered encounter medications on file as of 10/26/2021.    10-26-2021: Completed application for Anoro ellipta to mail.  Huey Romans [facility] Clinical Pharmacist Assistant [phone]

## 2021-11-01 DIAGNOSIS — I10 Essential (primary) hypertension: Secondary | ICD-10-CM | POA: Diagnosis not present

## 2021-11-01 DIAGNOSIS — J449 Chronic obstructive pulmonary disease, unspecified: Secondary | ICD-10-CM | POA: Diagnosis not present

## 2021-11-08 ENCOUNTER — Telehealth: Payer: Self-pay

## 2021-11-08 NOTE — Progress Notes (Signed)
error 

## 2021-11-14 ENCOUNTER — Telehealth: Payer: Self-pay

## 2021-11-14 ENCOUNTER — Telehealth: Payer: Self-pay | Admitting: Internal Medicine

## 2021-11-14 NOTE — Telephone Encounter (Signed)
The pt was offered an appt for today she said she will wait for her scheduled appt. The pt was told to take atorvastatin 3 days per week when she mentioned  leg cramps. ?

## 2021-11-14 NOTE — Telephone Encounter (Signed)
I spoke with patient to change appt time on awv  ? ? ?She stated she is having breathing problems she stated she cannot do any work.  Walking to kitchen is hard with out sitting down to rest.  She stated she left a message for the office to call her back 2-3 weeks ago and has not heard back from office. ?Patient didn't want to be transferred to the office   she said this wasn't something new ? ? ?She said she  is going to call pulm to see if she can get an appt with them ?

## 2021-11-21 ENCOUNTER — Other Ambulatory Visit: Payer: Self-pay

## 2021-11-21 ENCOUNTER — Encounter: Payer: Self-pay | Admitting: Internal Medicine

## 2021-11-21 ENCOUNTER — Ambulatory Visit (INDEPENDENT_AMBULATORY_CARE_PROVIDER_SITE_OTHER): Payer: Medicare Other | Admitting: Internal Medicine

## 2021-11-21 VITALS — BP 120/64 | HR 71 | Ht 65.2 in | Wt 174.0 lb

## 2021-11-21 DIAGNOSIS — N3941 Urge incontinence: Secondary | ICD-10-CM | POA: Diagnosis not present

## 2021-11-21 DIAGNOSIS — M791 Myalgia, unspecified site: Secondary | ICD-10-CM

## 2021-11-21 DIAGNOSIS — I10 Essential (primary) hypertension: Secondary | ICD-10-CM | POA: Diagnosis not present

## 2021-11-21 DIAGNOSIS — J984 Other disorders of lung: Secondary | ICD-10-CM

## 2021-11-21 DIAGNOSIS — Z23 Encounter for immunization: Secondary | ICD-10-CM | POA: Diagnosis not present

## 2021-11-21 DIAGNOSIS — E7800 Pure hypercholesterolemia, unspecified: Secondary | ICD-10-CM | POA: Diagnosis not present

## 2021-11-21 DIAGNOSIS — R7303 Prediabetes: Secondary | ICD-10-CM

## 2021-11-21 DIAGNOSIS — Z0000 Encounter for general adult medical examination without abnormal findings: Secondary | ICD-10-CM

## 2021-11-21 DIAGNOSIS — M25511 Pain in right shoulder: Secondary | ICD-10-CM | POA: Diagnosis not present

## 2021-11-21 LAB — POCT URINALYSIS DIPSTICK
Glucose, UA: NEGATIVE
Ketones, UA: NEGATIVE
Leukocytes, UA: NEGATIVE
Nitrite, UA: NEGATIVE
Protein, UA: POSITIVE — AB
Spec Grav, UA: >=1.03 — AB (ref 1.010–1.025)
Urobilinogen, UA: 0.2 E.U./dL
pH, UA: 6.5 (ref 5.0–8.0)

## 2021-11-21 NOTE — Patient Instructions (Signed)

## 2021-11-21 NOTE — Progress Notes (Signed)
?Jeri Cos [place],acting as a Neurosurgeon for Gwynneth Aliment, MD.,have documented all relevant documentation on the behalf of Gwynneth Aliment, MD,as directed by  Gwynneth Aliment, MD while in the presence of Gwynneth Aliment, MD.  ?This visit occurred during the SARS-CoV-2 public health emergency.  Safety protocols were in place, including screening questions prior to the visit, additional usage of staff PPE, and extensive cleaning of exam room while observing appropriate contact time as indicated for disinfecting solutions. ? ?Subjective:  ?  ? Patient ID: Katherine Summers , female    DOB: May 16, 1946 , 76 y.o.   MRN: [id number] ? ? ?Chief Complaint  ?Patient presents with  ? Annual Exam  ? Hypertension  ? ? ?HPI ? ?Patient presents today for a physical. She reports compliance with meds. Patient has no questions or concerns at this time.  We also discussed her recent dissatisfaction with the practice. She advised [facility] representative that she called two weeks ago and did not receive a call back. Pt advised no one received her message and that if this happens in the future, to please call again. She does acknowledge that this has never happened in the past.  ? ?Hypertension ?This is a chronic problem. The current episode started more than 1 year ago. The problem has been gradually improving since onset. The problem is controlled. Pertinent negatives include no blurred vision, chest pain, palpitations or shortness of breath. Risk factors for coronary artery disease include obesity, post-menopausal state, stress and sedentary lifestyle. The current treatment provides moderate improvement. Compliance problems include exercise.    ? ?Past Medical History:  ?Diagnosis Date  ? Allergy   ? CKD (chronic kidney disease) stage 2, GFR 60-89 ml/min   ? COPD (chronic obstructive pulmonary disease) (HCC)   ? Depression 08/01/2016  ? Dizziness and giddiness   ? Essential hypertension 08/01/2016  ? Hyperlipidemia   ? Hypertension   ?  Murmur 08/01/2016  ? Pre-diabetes   ? Shortness of breath 08/01/2016  ? Vitamin D deficiency   ?  ? ?Family History  ?Problem Relation Age of Onset  ? Stroke Mother   ? Colon cancer Father   ? Colon cancer Sister   ? Stroke Maternal Grandmother   ? Colon cancer Paternal Grandfather   ? ? ? ?Current Outpatient Medications:  ?  amLODipine (NORVASC) 5 MG tablet, TAKE 1 TABLET(5 MG) BY MOUTH DAILY, Disp: 90 tablet, Rfl: 2 ?  atorvastatin (LIPITOR) 20 MG tablet, TAKE 1 TABLET BY MOUTH EVERY DAY (Patient taking differently: Take 1 Monday), Disp: 90 tablet, Rfl: 2 ?  escitalopram (LEXAPRO) 10 MG tablet, TAKE 1 TABLET(10 MG) BY MOUTH DAILY, Disp: 90 tablet, Rfl: 1 ?  lisinopril-hydrochlorothiazide (ZESTORETIC) 20-12.5 MG tablet, TAKE 1 TABLET BY MOUTH DAILY, Disp: 90 tablet, Rfl: 2 ?  VITAMIN D PO, Take 5,000 mg by mouth. Take 1 daily mon through fri, Disp: , Rfl:  ?  umeclidinium-vilanterol (ANORO ELLIPTA) 62.5-25 MCG/INH AEPB, Inhale 1 puff into the lungs daily. (Patient not taking: Reported on 11/21/2021), Disp: 2 each, Rfl: 0  ? ?No Known Allergies  ? ?The patient states she uses post menopausal status for birth control. Last LMP was No LMP recorded. Patient is postmenopausal.. Negative for Dysmenorrhea. Negative for: breast discharge, breast lump(s), breast pain and breast self exam. Associated symptoms include abnormal vaginal bleeding. Pertinent negatives include abnormal bleeding (hematology), anxiety, decreased libido, depression, difficulty falling sleep, dyspareunia, history of infertility, nocturia, sexual dysfunction, sleep disturbances, urinary incontinence, urinary  urgency, vaginal discharge and vaginal itching. Diet regular.The patient states her exercise level is  minimal. ? . The patient's tobacco use is:  ?Social History  ? ?Tobacco Use  ?Smoking Status Former  ? Packs/day: 0.25  ? Years: 12.00  ? Pack years: 3.00  ? Types: Cigarettes  ? Quit date: 1998  ? Years since quitting: 25.2  ?Smokeless Tobacco  Never  ?Marland Kitchen. She has been exposed to passive smoke. The patient's alcohol use is:  ?Social History  ? ?Substance and Sexual Activity  ?Alcohol Use Not Currently  ? Alcohol/week: 0.0 standard drinks  ? ? ?Review of Systems  ?Constitutional: Negative.   ?HENT: Negative.    ?Eyes: Negative.  Negative for blurred vision.  ?Respiratory: Negative.  Negative for shortness of breath.   ?Cardiovascular: Negative.  Negative for chest pain and palpitations.  ?Gastrointestinal: Negative.   ?Endocrine: Negative.   ?Genitourinary: Negative.   ?Musculoskeletal:  Positive for arthralgias and myalgias.  ?     She c/o leg pain, usually occurs at night. She has a friend who is a Engineer, civil (consulting)nurse and she told her that it must be her chol meds.  ? ?Additionally, she c/o left shoulder pain. Denies fall/trauma. There is pain with movement. Not sure what triggered her sx.   ?Skin: Negative.   ?Allergic/Immunologic: Negative.   ?Neurological: Negative.   ?Hematological: Negative.   ?Psychiatric/Behavioral: Negative.     ? ?Today's Vitals  ? 11/21/21 0831  ?BP: 120/64  ?Pulse: 71  ?Weight: 174 lb (78.9 kg)  ?Height: 5' 5.2" (1.656 m)  ?PainSc: 5   ?PainLoc: Leg  ? ?Body mass index is 28.78 kg/m?.  ? ?Wt Readings from Last 3 Encounters:  ?11/21/21 174 lb (78.9 kg)  ?06/29/21 176 lb (79.8 kg)  ?04/13/21 168 lb (76.2 kg)  ? ? ? ?Objective:  ?Physical Exam ?Vitals and nursing note reviewed.  ?Constitutional:   ?   General: She is not in acute distress. ?   Appearance: Normal appearance. She is well-developed. She is obese.  ?HENT:  ?   Head: Normocephalic and atraumatic.  ?   Right Ear: Hearing, tympanic membrane, ear canal and external ear normal. There is no impacted cerumen.  ?   Left Ear: Hearing, tympanic membrane, ear canal and external ear normal. There is no impacted cerumen.  ?   Nose:  ?   Comments: Deferred - masked ?   Mouth/Throat:  ?   Comments: Deferred - masked ?Eyes:  ?   General: Lids are normal.  ?   Extraocular Movements: Extraocular  movements intact.  ?   Conjunctiva/sclera: Conjunctivae normal.  ?   Pupils: Pupils are equal, round, and reactive to light.  ?   Funduscopic exam: ?   Right eye: No papilledema.     ?   Left eye: No papilledema.  ?Neck:  ?   Thyroid: No thyroid mass.  ?   Vascular: No carotid bruit.  ?Cardiovascular:  ?   Rate and Rhythm: Normal rate and regular rhythm.  ?   Pulses: Normal pulses.  ?   Heart sounds: Normal heart sounds. No murmur heard. ?Pulmonary:  ?   Effort: Pulmonary effort is normal.  ?Chest:  ?   Chest wall: No mass.  ?Breasts: ?   Tanner Score is 5.  ?   Right: Normal. No mass or tenderness.  ?   Left: Normal. No mass or tenderness.  ?Abdominal:  ?   General: Abdomen is flat. Bowel sounds are normal. There  is no distension.  ?   Palpations: Abdomen is soft.  ?   Tenderness: There is no abdominal tenderness.  ?Genitourinary: ?   Rectum: Guaiac result negative.  ?Musculoskeletal:     ?   General: Tenderness present. No swelling. Normal range of motion.  ?   Cervical back: Full passive range of motion without pain, normal range of motion and neck supple.  ?   Right lower leg: No edema.  ?   Left lower leg: No edema.  ?   Comments: Decreased ROM R shoulder  ?Lymphadenopathy:  ?   Upper Body:  ?   Right upper body: No supraclavicular, axillary or pectoral adenopathy.  ?   Left upper body: No supraclavicular, axillary or pectoral adenopathy.  ?Skin: ?   General: Skin is warm and dry.  ?   Capillary Refill: Capillary refill takes less than 2 seconds.  ?Neurological:  ?   General: No focal deficit present.  ?   Mental Status: She is alert and oriented to person, place, and time.  ?   Cranial Nerves: No cranial nerve deficit.  ?   Sensory: No sensory deficit.  ?Psychiatric:     ?   Mood and Affect: Mood normal.     ?   Behavior: Behavior normal.     ?   Thought Content: Thought content normal.     ?   Judgment: Judgment normal.  ?   ?Assessment And Plan:  ?   ?1. Encounter for general adult medical examination w/o  abnormal findings ?Comments: A full exam was performed. Importance of monthly self breast exams was discussed with the patient. PATIENT IS ADVISED TO GET 30-45 MINUTES REGULAR EXERCISE NO LESS THAN FOUR TO FIVE DAY

## 2021-11-22 LAB — CMP14+EGFR
ALT: 12 IU/L (ref 0–32)
AST: 21 IU/L (ref 0–40)
Albumin/Globulin Ratio: 1.6 (ref 1.2–2.2)
Albumin: 4.3 g/dL (ref 3.7–4.7)
Alkaline Phosphatase: 117 IU/L (ref 44–121)
BUN/Creatinine Ratio: 20 (ref 12–28)
BUN: 16 mg/dL (ref 8–27)
Bilirubin Total: 0.5 mg/dL (ref 0.0–1.2)
CO2: 24 mmol/L (ref 20–29)
Calcium: 10 mg/dL (ref 8.7–10.3)
Chloride: 100 mmol/L (ref 96–106)
Creatinine, Ser: 0.81 mg/dL (ref 0.57–1.00)
Globulin, Total: 2.7 g/dL (ref 1.5–4.5)
Glucose: 101 mg/dL — ABNORMAL HIGH (ref 70–99)
Potassium: 3.4 mmol/L — ABNORMAL LOW (ref 3.5–5.2)
Sodium: 139 mmol/L (ref 134–144)
Total Protein: 7 g/dL (ref 6.0–8.5)
eGFR: 76 mL/min/{1.73_m2} (ref >59)

## 2021-11-22 LAB — MICROALBUMIN / CREATININE URINE RATIO
Creatinine, Urine: 239.1 mg/dL
Microalb/Creat Ratio: 19 mg/g creat (ref 0–29)
Microalbumin, Urine: 45.2 ug/mL

## 2021-11-22 LAB — CBC
Hematocrit: 41.8 % (ref 34.0–46.6)
Hemoglobin: 14.4 g/dL (ref 11.1–15.9)
MCH: 29.2 pg (ref 26.6–33.0)
MCHC: 34.4 g/dL (ref 31.5–35.7)
MCV: 85 fL (ref 79–97)
Platelets: 290 10*3/uL (ref 150–450)
RBC: 4.93 x10E6/uL (ref 3.77–5.28)
RDW: 12.3 % (ref 11.7–15.4)
WBC: 9.3 10*3/uL (ref 3.4–10.8)

## 2021-11-22 LAB — HEMOGLOBIN A1C
Est. average glucose Bld gHb Est-mCnc: 120 mg/dL
Hgb A1c MFr Bld: 5.8 % — ABNORMAL HIGH (ref 4.8–5.6)

## 2021-11-22 LAB — CK: Total CK: 86 U/L (ref 32–182)

## 2021-11-23 ENCOUNTER — Telehealth: Payer: Medicare Other

## 2021-11-23 ENCOUNTER — Ambulatory Visit (INDEPENDENT_AMBULATORY_CARE_PROVIDER_SITE_OTHER): Payer: Medicare Other

## 2021-11-23 DIAGNOSIS — J449 Chronic obstructive pulmonary disease, unspecified: Secondary | ICD-10-CM

## 2021-11-23 DIAGNOSIS — R7303 Prediabetes: Secondary | ICD-10-CM

## 2021-11-23 DIAGNOSIS — M79671 Pain in right foot: Secondary | ICD-10-CM

## 2021-11-23 DIAGNOSIS — N3281 Overactive bladder: Secondary | ICD-10-CM

## 2021-11-23 DIAGNOSIS — I10 Essential (primary) hypertension: Secondary | ICD-10-CM

## 2021-11-24 NOTE — Patient Instructions (Signed)
Visit Information ? ?Thank you for taking time to visit with me today. Please don't hesitate to contact me if I can be of assistance to you before our next scheduled telephone appointment. ? ?Following are the goals we discussed today:  ?(Copy and paste patient goals from clinical care plan here) ? ?Our next appointment is by telephone on 01/06/22 at 9:30 AM ? ?Please call the care guide team at [phone] if you need to cancel or reschedule your appointment.  ? ?If you are experiencing a Mental Health or [place] or need someone to talk to, please call 1-800-273-TALK (toll free, 24 hour hotline)  ? ?The patient verbalized understanding of instructions, educational materials, and care plan provided today and agreed to receive a mailed copy of patient instructions, educational materials, and care plan.  ? ?Barb Merino, RN, BSN, CCM ?Care Management Coordinator ?[place] [facility]  ?Direct Phone: [phone] ? ? ?

## 2021-11-24 NOTE — Chronic Care Management (AMB) (Signed)
?Chronic Care Management  ? ?CCM RN Visit Note ? ?11/23/2021 ?Name: Katherine Summers MRN: [id number] DOB: March 07, 1946 ? ?Subjective: ?Katherine Summers is a 76 y.o. year old female who is a primary care patient of Dorothyann PengSanders, Robyn, MD. The care management team was consulted for assistance with disease management and care coordination needs.   ? ?Engaged with patient by telephone for follow up visit in response to provider referral for case management and/or care coordination services.  ? ?Consent to Services:  ?The patient was given information about Chronic Care Management services, agreed to services, and gave verbal consent prior to initiation of services.  Please see initial visit note for detailed documentation.  ? ?Patient agreed to services and verbal consent obtained.  ? ?Assessment: Review of patient past medical history, allergies, medications, health status, including review of consultants reports, laboratory and other test data, was performed as part of comprehensive evaluation and provision of chronic care management services.  ? ?SDOH (Social Determinants of Health) assessments and interventions performed:  Yes, no acute challenges ? ?CCM Care Plan ? ?No Known Allergies ? ?Outpatient Encounter Medications as of 11/23/2021  ?Medication Sig  ? amLODipine (NORVASC) 5 MG tablet TAKE 1 TABLET(5 MG) BY MOUTH DAILY  ? atorvastatin (LIPITOR) 20 MG tablet TAKE 1 TABLET BY MOUTH EVERY DAY (Patient taking differently: Take 1 Monday)  ? escitalopram (LEXAPRO) 10 MG tablet TAKE 1 TABLET(10 MG) BY MOUTH DAILY  ? lisinopril-hydrochlorothiazide (ZESTORETIC) 20-12.5 MG tablet TAKE 1 TABLET BY MOUTH DAILY  ? umeclidinium-vilanterol (ANORO ELLIPTA) 62.5-25 MCG/INH AEPB Inhale 1 puff into the lungs daily. (Patient not taking: Reported on 11/21/2021)  ? VITAMIN D PO Take 5,000 mg by mouth. Take 1 daily mon through fri  ? ?No facility-administered encounter medications on file as of 11/23/2021.  ? ? ?Patient Active Problem List  ?  Diagnosis Date Noted  ? Unintentional weight loss 12/03/2020  ? Pure hypercholesterolemia 12/03/2020  ? OAB (overactive bladder) 12/03/2020  ? Alkaline phosphatase raised 10/05/2020  ? 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 Family history of malignant neoplasm of gastrointestinal tract 10/05/2020  ? Personal history of colonic polyps 10/05/2020  ? Impairment of balance 12/18/2019  ? Muscle weakness 12/18/2019  ? Urinary frequency 10/31/2018  ? Prediabetes 10/31/2018  ? OSA on CPAP 07/22/2018  ? CPAP use counseling 07/22/2018  ? Abnormal dreams 07/22/2018  ? Chronic obstructive pulmonary disease (HCC) 03/21/2018  ? Other fatigue 03/21/2018  ? Snorings 03/21/2018  ? Nocturia more than twice per night 03/21/2018  ? 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 Pain in right knee 01/04/2018  ? Overweight (BMI 25.0-29.9) 11/01/2016  ? 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 Murmur 08/01/2016  ? Shortness of breath 08/01/2016  ? Abnormal EKG 08/01/2016  ? Essential hypertension 08/01/2016  ? Depression 08/01/2016  ? ? ?Conditions to be addressed/monitored: HTN, COPD, Prediabetes, Overactive bladder, Foot pain, bilateral  ? ?Care Plan : RN Care Manager Plan of Care  ?Updates made by Riley ChurchesLittle, Talia Hoheisel L, RN since 11/23/2021 12:00 AM  ?  ? ?Problem: No plan of care established for management of chronic disease states (HTN, COPD, Prediabetes, Overactive bladder, Foot pain, bilateral)   ?Priority: High  ?  ? ?Long-Range Goal: Establishment of plan of care for management of chronic disease states (HTN, COPD, Prediabetes, Overactive bladder, Foot pain, bilateral)   ?Start Date: 10/20/2021  ?Expected End Date: 10/20/2022  ?Recent Progress: On track  ?Priority: High  ?Note:   ?Current Barriers:  ?Knowledge Deficits related  to plan of care for management of HTN, COPD, Prediabetes, Overactive bladder, Foot pain, bilateral   ?Chronic Disease Management support and education needs related to HTN, COPD, Prediabetes, Overactive bladder, Foot pain, bilateral    ? ?RNCM Clinical Goal(s):  ?Patient will verbalize basic understanding of  HTN, COPD, Prediabetes, Overactive bladder, Foot pain, bilateral  disease process and self health management plan as evidenced by patient will report having no disease exacerbation related to her chronic disease states as listed above  ?take all medications exactly as prescribed and will call provider for medication related questions as evidenced by will demonstrate improved understanding of prescribed medications and rationale for usage as evidenced by patient teach back ?demonstrate Improved health management independence as evidenced by patient will report 100% adherence to her prescribed treatment plan  ?continue to work with RN Care Manager to address care management and care coordination needs related to  HTN, COPD, Prediabetes, Overactive bladder, Foot pain, bilateral  as evidenced by adherence to CM Team Scheduled appointments ?demonstrate ongoing self health care management ability   as evidenced by    through collaboration with RN Care manager, provider, and care team.  ? ?Interventions: ?1:1 collaboration with primary care provider regarding development and update of comprehensive plan of care as evidenced by provider attestation and co-signature ?Inter-disciplinary care team collaboration (see longitudinal plan of care) ?Evaluation of current treatment plan related to  self management and patient's adherence to plan as established by provider ? ?COPD Interventions:  (Status:  Goal on track:  Yes.) Long Term Goal ?Confirmed patient continues to have difficulty with shortness of breath, she feels she is unable to completely exhale ?Educated patient on the basic disease process related to COPD ?Advised patient to track and manage COPD triggers ?Provided instruction about proper use of medications used for management of COPD including inhalers, determined was previously prescribed Trelegy but was unable to afford the medication,  Pulmonology aware ?Reviewed and discussed recommendations for next Pulmonology follow up and determined patient did not follow up after 3 months as recommended, she did not realize and will call today to schedule a follow up with Pulmonology for evaluation and treatment of COPD ?Encouraged patient to contact her health plan and ask for alternative inhalers to Trelegy covered by health plan and discuss with Pulmonologist at next visit, patient verbalizes understanding  ?Advised patient to engage in light exercise as tolerated 3-5 days a week to aid in the the management of COPD ?Discussed the importance of adequate rest and management of fatigue with COPD ?Discussed plans with patient for ongoing care management follow up and provided patient with direct contact information for care management team ? ? ?Prediabetes Interventions:  (Status:  Goal on track:  Yes.) Long Term Goal ?Assessed patient's understanding of A1c goal:  <5.7 % ?Provided education to patient about basic DM disease process ?Review of patient status, including review of consultants reports, relevant laboratory and other test results, and medications completed ?Encouraged patient to continue to adhere to a carb friendly diet and to implement a home exercise regimen shooting for 150 minutes weekly as tolerated ?Lab Results  ?Component Value Date  ? HGBA1C 5.8 (H) 11/21/2021  ?Impaired Urinary Elimination:  (Status:  Goal on track:  Yes.)  Long Term Goal ?Evaluation of current treatment plan related to  Impaired Urinary Elimination , self-management and patient's adherence to plan as established by provider ?Determined patient continues to have her overactive bladder treated by [facility] ?Reviewed medications with patient and discussed  importance of medication adherence ?Educated patient on the importance to stay well hydrated with water, drinking 48-64 oz daily unless otherwise directed ?Encouraged patient to keep all scheduled MD appointments  as directed ?Discussed plans with patient for ongoing care management follow up and provided patient with direct contact information for care management team ? ? ?Hypertension Interventions:  (Status:  Goal

## 2021-11-28 ENCOUNTER — Encounter: Payer: Self-pay | Admitting: Neurology

## 2021-11-28 NOTE — Telephone Encounter (Signed)
error 

## 2021-12-02 DIAGNOSIS — J449 Chronic obstructive pulmonary disease, unspecified: Secondary | ICD-10-CM | POA: Diagnosis not present

## 2021-12-02 DIAGNOSIS — I10 Essential (primary) hypertension: Secondary | ICD-10-CM | POA: Diagnosis not present

## 2021-12-05 ENCOUNTER — Ambulatory Visit (INDEPENDENT_AMBULATORY_CARE_PROVIDER_SITE_OTHER): Payer: Medicare Other | Admitting: Pulmonary Disease

## 2021-12-05 ENCOUNTER — Encounter: Payer: Self-pay | Admitting: Pulmonary Disease

## 2021-12-05 VITALS — BP 124/82 | HR 67 | Ht 65.5 in | Wt 170.0 lb

## 2021-12-05 DIAGNOSIS — J449 Chronic obstructive pulmonary disease, unspecified: Secondary | ICD-10-CM | POA: Diagnosis not present

## 2021-12-05 MED ORDER — ALBUTEROL SULFATE HFA 108 (90 BASE) MCG/ACT IN AERS: 2.0000 | INHALATION_SPRAY | Freq: Four times a day (QID) | RESPIRATORY_TRACT | 6 refills | Status: AC | PRN

## 2021-12-05 MED ORDER — TRELEGY ELLIPTA 100-62.5-25 MCG/ACT IN AEPB
1.0000 | INHALATION_SPRAY | Freq: Every day | RESPIRATORY_TRACT | 0 refills | Status: DC
Start: 2021-12-05 — End: 2022-01-26

## 2021-12-05 MED ORDER — TRELEGY ELLIPTA 100-62.5-25 MCG/ACT IN AEPB: 1.0000 | INHALATION_SPRAY | Freq: Every day | RESPIRATORY_TRACT | 11 refills | Status: DC

## 2021-12-05 NOTE — Progress Notes (Signed)
? ?Synopsis: Referred in August 2022 for COPD by Glendale Chard, MD ? ?Subjective:  ? ?PATIENT ID: Katherine Summers GENDER: female DOB: [birth year], MRN: [id number] ? ?HPI ? ?Chief Complaint  ?Patient presents with  ? Follow-up  ?  F/U for COPD. States she is no longer using the Anoro. Still has some increased SOB.   ? ?Katherine Summers is a 76 year old woman, former smoker with hypertension, hyperlipidemia and obstructive sleep apnea who returns to pulmonary clinic for COPD. ? ?She was trialed on anoro ellipta and did not notice much difference after trying it.  ? ?She has not been using CPAP in months due to issues with tolerating her current mask.  ? ?She has dyspnea when walking from room to room in her home.  ? ?OV 04/13/21 ?She reports having shortness of breath for the last 10 years.  She reports being diagnosed with COPD around 10 to 12 years ago but does not recall having pulmonary function testing done.  She was trialed on Spiriva inhaler therapy and did not report any benefit. ? ?She has history of obstructive sleep apnea and has a CPAP machine at home that she is not currently using.  She reports issues with the nasal mask fitting.  She is followed by Dr. Brett Fairy at [facility]. ? ?Does have shortness of breath at rest and with activity and a nonproductive cough.  She also complains of nasal congestion. ? ?Is a former smoker and quit in the 1980s.  She was more of a social smoker.  She does report significant history of secondhand smoke as her parents smoked around her when she was growing up. ? ?Past Medical History:  ?Diagnosis Date  ? Allergy   ? CKD (chronic kidney disease) stage 2, GFR 60-89 ml/min   ? COPD (chronic obstructive pulmonary disease) ([place])   ? Depression 08/01/2016  ? Dizziness and giddiness   ? Essential hypertension 08/01/2016  ? Hyperlipidemia   ? Hypertension   ? Murmur 08/01/2016  ? Pre-diabetes   ? Shortness of breath 08/01/2016  ? Vitamin D deficiency   ?  ? ?Family  History  ?Problem Relation Age of Onset  ? Stroke Mother   ? Colon cancer Father   ? Colon cancer Sister   ? Stroke Maternal Grandmother   ? Colon cancer Paternal Grandfather   ?  ? ?Social History  ? ?Socioeconomic History  ? Marital status: Single  ?  Spouse name: Not on file  ? Number of children: Not on file  ? Years of education: Not on file  ? Highest education level: Not on file  ?Occupational History  ? Occupation: retired  ?Tobacco Use  ? Smoking status: Former  ?  Packs/day: 0.25  ?  Years: 12.00  ?  Pack years: 3.00  ?  Types: Cigarettes  ?  Quit date: 1998  ?  Years since quitting: 25.2  ? Smokeless tobacco: Never  ?Vaping Use  ? Vaping Use: Never used  ?Substance and Sexual Activity  ? Alcohol use: Not Currently  ?  Alcohol/week: 0.0 standard drinks  ? Drug use: No  ? Sexual activity: Not Currently  ?Other Topics Concern  ? Not on file  ?Social History Narrative  ? Not on file  ? ?Social Determinants of Health  ? ?Financial Resource Strain: Low Risk   ? Difficulty of Paying Living Expenses: Not hard at all  ?Food Insecurity: No Food Insecurity  ? Worried About Charity fundraiser in  the Last Year: Never true  ? Ran Out of Food in the Last Year: Never true  ?Transportation Needs: No Transportation Needs  ? Lack of Transportation (Medical): No  ? Lack of Transportation (Non-Medical): No  ?Physical Activity: Inactive  ? Days of Exercise per Week: 0 days  ? Minutes of Exercise per Session: 0 min  ?Stress: No Stress Concern Present  ? Feeling of Stress : Not at all  ?Social Connections: Not on file  ?Intimate Partner Violence: Not on file  ?  ? ?No Known Allergies  ? ?Outpatient Medications Prior to Visit  ?Medication Sig Dispense Refill  ? amLODipine (NORVASC) 5 MG tablet TAKE 1 TABLET(5 MG) BY MOUTH DAILY 90 tablet 2  ? atorvastatin (LIPITOR) 20 MG tablet TAKE 1 TABLET BY MOUTH EVERY DAY (Patient taking differently: Take 1 Monday) 90 tablet 2  ? escitalopram (LEXAPRO) 10 MG tablet TAKE 1 TABLET(10 MG) BY  MOUTH DAILY 90 tablet 1  ? lisinopril-hydrochlorothiazide (ZESTORETIC) 20-12.5 MG tablet TAKE 1 TABLET BY MOUTH DAILY 90 tablet 2  ? VITAMIN D PO Take 5,000 mg by mouth. Take 1 daily mon through fri    ? umeclidinium-vilanterol (ANORO ELLIPTA) 62.5-25 MCG/INH AEPB Inhale 1 puff into the lungs daily. (Patient not taking: Reported on 11/21/2021) 2 each 0  ? ?No facility-administered medications prior to visit.  ? ? ?Review of Systems  ?Constitutional:  Negative for chills, fever, malaise/fatigue and weight loss.  ?HENT:  Negative for sinus pain and sore throat.   ?Eyes: Negative.   ?Respiratory:  Positive for shortness of breath. Negative for cough, hemoptysis, sputum production and wheezing.   ?Cardiovascular:  Negative for chest pain, palpitations, orthopnea, claudication and leg swelling.  ?Gastrointestinal:  Negative for abdominal pain, heartburn, nausea and vomiting.  ?Genitourinary: Negative.   ?Musculoskeletal:  Negative for joint pain and myalgias.  ?Skin:  Negative for rash.  ?Neurological:  Negative for weakness.  ?Endo/Heme/Allergies: Negative.   ?Psychiatric/Behavioral:  Negative for depression.   ? ?Objective:  ? ?Vitals:  ? 12/05/21 0918  ?BP: 124/82  ?Pulse: 67  ?SpO2: 98%  ?Weight: 170 lb (77.1 kg)  ?Height: 5' 5.5" (1.664 m)  ? ? ?Physical Exam ?Constitutional:   ?   General: She is not in acute distress. ?   Appearance: She is not ill-appearing.  ?HENT:  ?   Head: Normocephalic and atraumatic.  ?Eyes:  ?   General: No scleral icterus. ?   Conjunctiva/sclera: Conjunctivae normal.  ?Cardiovascular:  ?   Rate and Rhythm: Normal rate and regular rhythm.  ?   Pulses: Normal pulses.  ?   Heart sounds: Normal heart sounds. No murmur heard. ?Pulmonary:  ?   Effort: Pulmonary effort is normal.  ?   Breath sounds: Decreased air movement present. No wheezing, rhonchi or rales.  ?Musculoskeletal:  ?   Right lower leg: No edema.  ?   Left lower leg: No edema.  ?Skin: ?   General: Skin is warm and dry.   ?Neurological:  ?   General: No focal deficit present.  ?   Mental Status: She is alert.  ?Psychiatric:     ?   Mood and Affect: Mood normal.     ?   Behavior: Behavior normal.     ?   Thought Content: Thought content normal.     ?   Judgment: Judgment normal.  ? ? ?CBC ?   ?Component Value Date/Time  ? WBC 9.3 11/21/2021 0921  ? WBC 8.2 02/24/2018 1512  ?  RBC 4.93 11/21/2021 0921  ? RBC 4.67 02/24/2018 1512  ? HGB 14.4 11/21/2021 0921  ? HCT 41.8 11/21/2021 0921  ? PLT 290 11/21/2021 0921  ? MCV 85 11/21/2021 0921  ? MCH 29.2 11/21/2021 0921  ? MCH 29.3 02/24/2018 1512  ? MCHC 34.4 11/21/2021 0921  ? MCHC 34.4 02/24/2018 1512  ? RDW 12.3 11/21/2021 0921  ? LYMPHSABS 1.8 02/24/2018 1512  ? MONOABS 0.6 02/24/2018 1512  ? EOSABS 0.1 02/24/2018 1512  ? BASOSABS 0.0 02/24/2018 1512  ? ? ?  Latest Ref Rng & Units 11/21/2021  ?  9:21 AM 06/29/2021  ? 12:46 PM 11/17/2020  ? 10:24 AM  ?BMP  ?Glucose 70 - 99 mg/dL 101   88   106    ?BUN 8 - 27 mg/dL 16   15   17     ?Creatinine 0.57 - 1.00 mg/dL 0.81   0.79   0.76    ?BUN/Creat Ratio 12 - 28 20   19   22     ?Sodium 134 - 144 mmol/L 139   143   137    ?Potassium 3.5 - 5.2 mmol/L 3.4   3.9   3.4    ?Chloride 96 - 106 mmol/L 100   102   97    ?CO2 20 - 29 mmol/L 24   25   23     ?Calcium 8.7 - 10.3 mg/dL 10.0   9.7   9.7    ? ?Chest imaging: ?HRCT Chest 05/05/21 ?1. No findings to suggest interstitial lung disease. ?2. Mild air trapping indicative of mild small airways disease. ?3. Aortic atherosclerosis, in addition to left main and left ?anterior descending coronary artery disease. Assessment for ?potential risk factor modification, dietary therapy or pharmacologic ?therapy may be warranted, if clinically indicated. ?4. Small hiatal hernia. ?5. Colonic diverticulosis. ?Addendum: ?Borderline enlarged and mildly enlarged lymph nodes in the axillary ?regions bilaterally (right greater than left). This is of uncertain ?etiology and significance. However, correlation with  physical ?examination and mammography is recommended in the near future. ? ?CXR 2019 ?Heart size is normal. Atherosclerosis and tortuosity of the aorta. ?Chronic benign appearing pleural and parenchymal scarring at both ?lung a

## 2021-12-05 NOTE — Addendum Note (Signed)
Addended by: Valerie Salts on: 12/05/2021 09:59 AM ? ? Modules accepted: Orders ? ?

## 2021-12-05 NOTE — Patient Instructions (Signed)
Start trelegy ellipta 1 puff daily ?- rinse mouth out after each use ? ?Use albuterol inhaler 1-2 puffs every 4-6 hours as need for shortness of breath, wheezing, or chest tightness ? ?Follow up in 6 months ?

## 2021-12-13 ENCOUNTER — Encounter: Payer: Self-pay | Admitting: Physician Assistant

## 2021-12-13 ENCOUNTER — Ambulatory Visit: Payer: Self-pay

## 2021-12-13 ENCOUNTER — Ambulatory Visit (INDEPENDENT_AMBULATORY_CARE_PROVIDER_SITE_OTHER): Payer: Medicare Other | Admitting: Physician Assistant

## 2021-12-13 ENCOUNTER — Ambulatory Visit (INDEPENDENT_AMBULATORY_CARE_PROVIDER_SITE_OTHER): Payer: Medicare Other

## 2021-12-13 DIAGNOSIS — M25512 Pain in left shoulder: Secondary | ICD-10-CM | POA: Diagnosis not present

## 2021-12-13 DIAGNOSIS — G8929 Other chronic pain: Secondary | ICD-10-CM | POA: Diagnosis not present

## 2021-12-13 DIAGNOSIS — M25562 Pain in left knee: Secondary | ICD-10-CM

## 2021-12-13 DIAGNOSIS — M25561 Pain in right knee: Secondary | ICD-10-CM | POA: Diagnosis not present

## 2021-12-13 DIAGNOSIS — M25511 Pain in right shoulder: Secondary | ICD-10-CM | POA: Diagnosis not present

## 2021-12-13 NOTE — Addendum Note (Signed)
Addended by: Albertina Parr on: 12/13/2021 03:05 PM ? ? Modules accepted: Orders ? ?

## 2021-12-13 NOTE — Progress Notes (Signed)
? ?Office Visit Note ?  ?Patient: Katherine Summers           ?Date of Birth: 24-Mar-1946           ?MRN: [id number] ?Visit Date: 12/13/2021 ?             ?Requested by: Dorothyann Peng, MD ?[address] ?[address] ?[place],  [place] [address] ?PCP: Dorothyann Peng, MD ? ? ?Assessment & Plan: ?Visit Diagnoses:  ?1. Chronic pain of both shoulders   ? ? ?Plan: Impression is right shoulder adhesive capsulitis.  My concern is that she may have had pain following the pneumonia vaccine which caused her to limit her range of motion of the right shoulder and thus developed adhesive capsulitis.  I would like to refer her to Dr. Alvester Morin for diagnostic and therapeutic intra-articular cortisone injection.  I would also like to refer her to outpatient physical therapy.  She is agreeable to this plan.  If her symptoms have not improved over the next 4 to 6 weeks she will follow-up for recheck.  Call with concerns or questions. ? ?Follow-Up Instructions: Return if symptoms worsen or fail to improve.  ? ?Orders:  ?Orders Placed This Encounter  ?Procedures  ? XR Shoulder Left  ? XR Shoulder Right  ? ?No orders of the defined types were placed in this encounter. ? ? ? ? Procedures: ?No procedures performed ? ? ?Clinical Data: ?No additional findings. ? ? ?Subjective: ?Chief Complaint  ?Patient presents with  ? Right Shoulder - Pain  ? ? ?HPI patient is a very pleasant 76 year old female who comes in today with concerns about both shoulders right greater than left.  She received the pneumonia vaccine a few weeks ago in the right shoulder and has had stiffness since.  The began complaining of stiffness to the left shoulder soon after.  She denies any pain.  Symptoms are worse with any movement of the shoulder.  She denies any paresthesias to either upper extremity. ? ?Review of Systems as detailed in HPI.  All others reviewed and are negative. ? ? ?Objective: ?Vital Signs: There were no vitals taken for this visit. ? ?Physical Exam  well-developed well-nourished female no acute distress.  Alert and oriented x3. ? ?Ortho Exam examination of her right shoulder reveals active forward flexion to approximately 45 degrees.  I can passively get her to about 100 degrees.  She has very limited internal and external rotation.  4 out of 5 strength with resisted external rotation.  Left shoulder exam reveals near full active and painless range of motion all planes.  No focal weakness.  She is neurovascular intact distally. ? ?Specialty Comments:  ?No specialty comments available. ? ?Imaging: ?XR Shoulder Right ? ?Result Date: 12/13/2021 ?Moderate djd to the humeral head and AC joint  ? ? ?PMFS History: ?Patient Active Problem List  ? Diagnosis Date Noted  ? Unintentional weight loss 12/03/2020  ? Pure hypercholesterolemia 12/03/2020  ? OAB (overactive bladder) 12/03/2020  ? Alkaline phosphatase raised 10/05/2020  ? 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 Family history of malignant neoplasm of gastrointestinal tract 10/05/2020  ? Personal history of colonic polyps 10/05/2020  ? Impairment of balance 12/18/2019  ? Muscle weakness 12/18/2019  ? Urinary frequency 10/31/2018  ? Prediabetes 10/31/2018  ? OSA on CPAP 07/22/2018  ? CPAP use counseling 07/22/2018  ? Abnormal dreams 07/22/2018  ? Chronic obstructive pulmonary disease (HCC) 03/21/2018  ? Other fatigue 03/21/2018  ? Snorings 03/21/2018  ? Nocturia  more than twice per night 03/21/2018  ? 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 Pain in right knee 01/04/2018  ? Overweight (BMI 25.0-29.9) 11/01/2016  ? 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 Murmur 08/01/2016  ? Shortness of breath 08/01/2016  ? Abnormal EKG 08/01/2016  ? Essential hypertension 08/01/2016  ? Depression 08/01/2016  ? ?Past Medical History:  ?Diagnosis Date  ? Allergy   ? CKD (chronic kidney disease) stage 2, GFR 60-89 ml/min   ? COPD (chronic obstructive pulmonary disease) (HCC)   ? Depression 08/01/2016  ? Dizziness and giddiness   ? Essential  hypertension 08/01/2016  ? Hyperlipidemia   ? Hypertension   ? Murmur 08/01/2016  ? Pre-diabetes   ? Shortness of breath 08/01/2016  ? Vitamin D deficiency   ?  ?Family History  ?Problem Relation Age of Onset  ? Stroke Mother   ? Colon cancer Father   ? Colon cancer Sister   ? Stroke Maternal Grandmother   ? Colon cancer Paternal Grandfather   ?  ?Past Surgical History:  ?Procedure Laterality Date  ? CATARACT EXTRACTION, BILATERAL  2021  ? COLONOSCOPY    ? EYE SURGERY    ? FOOT SURGERY    ? TONSILLECTOMY    ? TOTAL KNEE ARTHROPLASTY Right 10/31/2016  ? Procedure: RIGHT TOTAL KNEE ARTHROPLASTY;  Surgeon: Durene Romans, MD;  Location: [facility];  Service: Orthopedics;  Laterality: Right;  Requesting for 70 mins  ? TUBAL LIGATION    ? ?Social History  ? ?Occupational History  ? Occupation: retired  ?Tobacco Use  ? Smoking status: Former  ?  Packs/day: 0.25  ?  Years: 12.00  ?  Pack years: 3.00  ?  Types: Cigarettes  ?  Quit date: 1998  ?  Years since quitting: 25.2  ? Smokeless tobacco: Never  ?Vaping Use  ? Vaping Use: Never used  ?Substance and Sexual Activity  ? Alcohol use: Not Currently  ?  Alcohol/week: 0.0 standard drinks  ? Drug use: No  ? Sexual activity: Not Currently  ? ? ? ? ? ? ?

## 2021-12-20 ENCOUNTER — Telehealth: Payer: Self-pay | Admitting: Neurology

## 2021-12-20 ENCOUNTER — Telehealth: Payer: Self-pay

## 2021-12-20 NOTE — Chronic Care Management (AMB) (Signed)
? ? ?Chronic Care Management ?Pharmacy Assistant  ? ?Name: Katherine Summers  MRN: [id number] DOB: [birth year] ? ? ?Reason for Encounter: Disease State/ Hypertension ? ?Recent office visits:  ?11-23-2021 Little, Karma Lew, RN (CCM). ? ?11-21-2021 Dorothyann Peng, MD. Glucose= 101, Potassium= 3.4. A1C= 5.8. Abnormal UA. EKG completed. Referral placed to orthopedic surgery. STOP Opticrom drops, melatonin, naproxen, maxitrol, nystatin, oxybutynin chloride, prednisolone drops and Polyethylene Glycol. ? ?10-20-2021 Little, Karma Lew, RN (CCM). ? ?Recent consult visits:  ?12-13-2021 Ivin Poot (Orthopedic surgery). XR of left and right shoulder completed. Referral placed to physical therapy and physical medicine rehab. ? ?12-05-2021 Martina Sinner, MD (Pulmonary). STOP Anoro ellipta. START albuterol inhaler 2 puffs every 6 hours as needed and trelegy ellipta 1 puff daily. ? ?09-06-2021 Charna Elizabeth, MD Laurette Schimke). Fiber tracking sheet provided. ? ?Hospital visits:  ?None in previous 6 months ? ?Medications: ?Outpatient Encounter Medications as of 12/20/2021  ?Medication Sig  ? albuterol (VENTOLIN HFA) 108 (90 Base) MCG/ACT inhaler Inhale 2 puffs into the lungs every 6 (six) hours as needed for wheezing or shortness of breath.  ? amLODipine (NORVASC) 5 MG tablet TAKE 1 TABLET(5 MG) BY MOUTH DAILY  ? atorvastatin (LIPITOR) 20 MG tablet TAKE 1 TABLET BY MOUTH EVERY DAY (Patient taking differently: Take 1 Monday)  ? escitalopram (LEXAPRO) 10 MG tablet TAKE 1 TABLET(10 MG) BY MOUTH DAILY  ? Fluticasone-Umeclidin-Vilant (TRELEGY ELLIPTA) 100-62.5-25 MCG/ACT AEPB Inhale 1 puff into the lungs daily.  ? Fluticasone-Umeclidin-Vilant (TRELEGY ELLIPTA) 100-62.5-25 MCG/ACT AEPB Inhale 1 puff into the lungs daily.  ? lisinopril-hydrochlorothiazide (ZESTORETIC) 20-12.5 MG tablet TAKE 1 TABLET BY MOUTH DAILY  ? VITAMIN D PO Take 5,000 mg by mouth. Take 1 daily mon through fri  ? ?No facility-administered encounter medications on file  as of 12/20/2021.  ? ?Reviewed chart prior to disease state call. Spoke with patient regarding BP ? ?Recent Office Vitals: ?BP Readings from Last 3 Encounters:  ?12/05/21 124/82  ?11/21/21 120/64  ?06/29/21 130/72  ? ?Pulse Readings from Last 3 Encounters:  ?12/05/21 67  ?11/21/21 71  ?06/29/21 70  ?  ?Wt Readings from Last 3 Encounters:  ?12/05/21 170 lb (77.1 kg)  ?11/21/21 174 lb (78.9 kg)  ?06/29/21 176 lb (79.8 kg)  ?  ? ?Kidney Function ?Lab Results  ?Component Value Date/Time  ? CREATININE 0.81 11/21/2021 09:21 AM  ? CREATININE 0.79 06/29/2021 12:46 PM  ? GFRNONAA 68 05/20/2020 09:55 AM  ? GFRAA 79 05/20/2020 09:55 AM  ? ? ? ?  Latest Ref Rng & Units 11/21/2021  ?  9:21 AM 06/29/2021  ? 12:46 PM 11/17/2020  ? 10:24 AM  ?BMP  ?Glucose 70 - 99 mg/dL 301   88   314    ?BUN 8 - 27 mg/dL 16   15   17     ?Creatinine 0.57 - 1.00 mg/dL   3.88   8.75    ?BUN/Creat Ratio 12 - 28 20   19   22     ?Sodium 134 - 144 mmol/L 139   143   137    ?Potassium 3.5 - 5.2 mmol/L 3.4   3.9   3.4    ?Chloride 96 - 106 mmol/L 100   102   97    ?CO2 20 - 29 mmol/L 24   25   23     ?Calcium 8.7 - 10.3 mg/dL 7.97   9.7   9.7    ? ? ?12-20-2021: 1st attempt left VM ?12-21-2021: 2nd  attempt left VM ?12-23-2021: 3rd attempt left VM ? ?Care Gaps: ?Shingrix overdue ?AWV 01-04-2022 ? ?Star Rating Drugs: ?Atorvastatin 20 mg- Last filled 12-01-2021 90 DS [facility] ?Lisinopril/ HCTZ 20- 12.5 mg- Last filled 11-30-2021 90 DS [facility] ? ?Malecca Hicks CMA ?Clinical Pharmacist Assistant ?[phone] ? ?

## 2021-12-20 NOTE — Therapy (Addendum)
OUTPATIENT PHYSICAL THERAPY EVALUATION /DISCHARGE   Patient Name: Katherine Summers MRN: [id number] DOB:Jul 14, 1946, 76 y.o., female Today's Date: 12/21/2021   PT End of Session - 12/21/21 1137     Visit Number 1    Number of Visits 20    Date for PT Re-Evaluation 03/01/22    Authorization Type [employer] Medicare    Progress Note Due on Visit 10    PT Start Time 1140    PT Stop Time 1218    PT Time Calculation (min) 38 min    Activity Tolerance Patient tolerated treatment well    Behavior During Therapy [facility] for tasks assessed/performed             Past Medical History:  Diagnosis Date   Allergy    CKD (chronic kidney disease) stage 2, GFR 60-89 ml/min    COPD (chronic obstructive pulmonary disease) ([place])    Depression 08/01/2016   Dizziness and giddiness    Essential hypertension 08/01/2016   Hyperlipidemia    Hypertension    Murmur 08/01/2016   Pre-diabetes    Shortness of breath 08/01/2016   Vitamin D deficiency    Past Surgical History:  Procedure Laterality Date   CATARACT EXTRACTION, BILATERAL  2021   COLONOSCOPY     EYE SURGERY     FOOT SURGERY     TONSILLECTOMY     TOTAL KNEE ARTHROPLASTY Right 10/31/2016   Procedure: RIGHT TOTAL KNEE ARTHROPLASTY;  Surgeon: Paralee Cancel, MD;  Location: [facility];  Service: Orthopedics;  Laterality: Right;  Requesting for 70 mins   TUBAL LIGATION     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   Family history of malignant neoplasm of gastrointestinal tract 10/05/2020   Personal history of colonic polyps 10/05/2020   Impairment of balance 12/18/2019   Muscle weakness 12/18/2019   Urinary frequency 10/31/2018   Prediabetes 10/31/2018   OSA on CPAP 07/22/2018   CPAP use counseling 07/22/2018   Abnormal dreams 07/22/2018    Chronic obstructive pulmonary disease ([place]) 03/21/2018   Other fatigue 03/21/2018   Snorings 03/21/2018   Nocturia more than twice per night 03/21/2018   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   Pain in right knee 01/04/2018   Overweight (BMI 25.0-29.9) 11/01/2016   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  Pt indicated she cant reach with Rt arm during the day with difficulty reaching out and up.  Pt also indicated Lt shoulder complaints similar but not as bad.  Insidious onset of symptoms.  No pain indicated, just difficulty.     PERTINENT HISTORY: Hyperlipidemia, HTN, COPD, CKD, depression.  History of Rt TKA, walking cane   PAIN:  Are you having pain? Yes:  NPRS scale: 5/10 at worst with movement Pain location: bilateral shoulder Pain description:  Aggravating factors: lifting arm Relieving factors:   PRECAUTIONS: None  WEIGHT BEARING RESTRICTIONS No  FALLS:  Has patient fallen in last 6 months? No  LIVING ENVIRONMENT: Lives with: lives alone Has following equipment at home: Single point cane   PLOF: Independent, ipad and watch tv, house cleaning  PATIENT GOALS  Reach up to cabinets in house  OBJECTIVE:   PATIENT SURVEYS:  12/21/2021:  FOTO intake:  48  predicted: 65   PSFS: 12/21/2021 Patient Specific Functional Scale (0 unable to 10 no difficulty) 1) Reaching to cabinet 0/10 2) Reaching/closing car door 6/10  COGNITION: 4/19/2023Overall cognitive status: Within functional limits for tasks assessed     POSTURE: 12/21/2021, bilateral shoulder protraction, rounded   UPPER EXTREMITY ROM:    12/21/2021 IE: active flexion against gravity in sitting:  Lt: 90 degrees, Rt: 70 degrees  Active (PROM) Measured in supine Right 12/21/2021 Left 12/21/2021  Shoulder flexion 130 (140) 120 (130)  Shoulder extension    Shoulder abduction 90 (95) 115  Shoulder adduction    Shoulder internal rotation 55 in supine in 45 deg abd 70 in supine in 45 deg abd  Shoulder external rotation 55 in supine in 45 deg abd 60 in supine in 45 deg abd  Elbow flexion    Elbow extension    Wrist flexion    Wrist extension    Wrist ulnar deviation    Wrist radial deviation    Wrist pronation    Wrist supination    (Blank rows = not tested)  UPPER EXTREMITY MMT:  MMT Right 12/21/2021 Left 12/21/2021  Shoulder flexion 2/5 5.1, 7.3 lbs in supine 90 deg flexion 2+/5 14.7, 13.7 lbs in supine 90 deg flexion  Shoulder extension    Shoulder abduction 2/5 2/5  Shoulder adduction    Shoulder internal rotation    Shoulder external rotation 3/5  4.2, 5.5 lbs in supine neutral positioning arm at side 4/5  11, 11.2 lbs in supine neutral positioning at side  Middle trapezius    Lower trapezius    Elbow flexion    Elbow extension    Wrist flexion    Wrist extension    Wrist ulnar deviation    Wrist radial deviation    Wrist pronation    Wrist supination    Grip strength (lbs)    (Blank rows = not tested)  SHOULDER SPECIAL TESTS: 12/21/2021  (+) shrug sign, (+) Drop arm Rt  JOINT MOBILITY TESTING:  12/21/2021 - Mild restriction in inferior, posterior glides bilateral shoulder Rt > Lt.   PALPATION:  12/21/2021 : no specific tenderness today  upon arrival  Vestibular 12/21/2021: production of symptoms in supine to sit to supine transfers, Lt and Rt rotation of cervical region   TODAY'S TREATMENT:  12/21/2021 Therex:  HEP instruction/performance c cues for techniques, handout provided.  Trial set performed of each for comprehension and symptom assessment.  See below for exercise list.   PATIENT EDUCATION:  12/21/2021 Education details: HEP, POC Person educated: Patient Education method: Consulting civil engineer, Demonstration, Verbal cues, and Handouts Education comprehension: verbalized understanding, returned demonstration, and verbal cues required   HOME EXERCISE PROGRAM: 12/21/2021 Access Code: [email or web address] URL: [email or web address] Date: 12/21/2021 Prepared by: Scot Jun  Exercises - Supine Shoulder Flexion Extension AAROM with Dowel  - 2 x daily - 7 x weekly - 3 sets - 10 reps - 1-2 hold - Supine Shoulder Protraction with Dowel  - 2 x daily - 7 x weekly - 1 sets - 10 reps - 2-3 hold - Seated Scapular Retraction  - 3-5 x daily - 7 x weekly - 1 sets - 10 reps - 3-5 hold  ASSESSMENT:  CLINICAL IMPRESSION: Patient is a 76 y.o. who comes to clinic with complaints of bilateral shoulder pain with mobility, strength and movement coordination deficits that impair their ability to perform usual daily and recreational functional activities without increase difficulty/symptoms at this time.  Patient to benefit from skilled PT services to address impairments and limitations to improve to previous level of function without restriction secondary to condition.    OBJECTIVE IMPAIRMENTS decreased activity tolerance, decreased coordination, decreased endurance, decreased mobility, decreased ROM, decreased strength, impaired perceived functional ability, increased muscle spasms, impaired flexibility, impaired UE functional use, improper body mechanics, postural dysfunction, and pain.   ACTIVITY LIMITATIONS cleaning, community  activity, driving, meal prep, laundry, and yard work.   PERSONAL FACTORS  Hyperlipidemia, HTN, COPD, CKD, depression, multiple treatment parts  are also affecting patient's functional outcome.    REHAB POTENTIAL: Good  CLINICAL DECISION MAKING: Stable/uncomplicated  EVALUATION COMPLEXITY: Low   GOALS: Goals reviewed with patient? Yes Eval : 12/21/2021 Short term PT Goals (target date for Short term goals are 3 weeks 01/11/2022) Patient will demonstrate independent use of home exercise program to maintain progress from in clinic treatments. Goal status: New   Long term PT goals (target dates for all long term goals are 10 weeks  03/01/2022 )  1. Patient will demonstrate/report pain at worst less than or equal to 2/10 to facilitate minimal limitation in daily activity secondary to pain symptoms. Goal status: New  2. Patient will demonstrate independent use of home exercise program to facilitate ability to maintain/progress functional gains from skilled physical therapy services. Goal status: New  3. Patient will demonstrate FOTO outcome > or = 65 % to indicate reduced disability due to condition. Goal status: New  4.  Patient will demonstrate bilateral [place] joint mobility WFL to facilitate usual self care, dressing, reaching overhead at PLOF s limitation due to symptoms.  Goal status: New  5.  Patient will demonstrate bilateral UE MMT 4/5 throughout to facilitate usual lifting, carrying in functional activity to PLOF s limitation.   Goal status: New  6.  Patient will demonstrate/report PSFS reporting > or = 7/10 to indicated reduced difficulty c activity.   Goal status: New   PLAN: PT FREQUENCY: 1-2x/week  PT DURATION: 10 weeks  PLANNED INTERVENTIONS: Therapeutic exercises, Therapeutic activity, Neuro Muscular re-education, Balance training, Gait training, Patient/Family education, Joint mobilization, Stair training, DME instructions, Dry Needling, Electrical stimulation,  Cryotherapy, Moist heat, Taping, Ultrasound, Ionotophoresis 39m/ml Dexamethasone, and Manual therapy.  All included unless contraindicated  PLAN FOR NEXT SESSION: Gravity reduced PROM/AAROM progression, possible vestibular check.    MScot Jun PT, DPT, OCS, ATC 12/21/21  12:32 PM  PHYSICAL THERAPY DISCHARGE SUMMARY  Visits from Start of Care: 1  Current functional level related to goals / functional outcomes: See note  Remaining deficits: See Note   Education / Equipment: HEP    Patient goals were not met. Patient is being discharged due to not returning since the last visit.  Scot Jun, PT, DPT, OCS, ATC 01/27/22  9:07 AM

## 2021-12-20 NOTE — Telephone Encounter (Signed)
Pt said having trouble using CPAP machine. Would like a call from the nurse to discuss getting a pillow (something that goes in your nose) ?

## 2021-12-20 NOTE — Telephone Encounter (Signed)
Called pt back. She has not used her CPAP machine in about 6 months or longer. Advised that since she was last seen 01/01/2020, she would need to come in for appt first before we can write a prescription.  ?Her pulmonologist is the one who recommend she contact Dr. Vickey Huger to inquire about nasal pillow for CPAP.  ?She reports she has trouble keeping CPAP on at night. Wakes up and knocks it off. Has nightmares.  ?She was set up w/ current machine 05/07/2018. DME: [facility]. ? ?She has appt scheduled right now for 02/27/22. Aware we will place her on cx list and call if anything sooner becomes available. ?

## 2021-12-21 ENCOUNTER — Encounter: Payer: Self-pay | Admitting: Rehabilitative and Restorative Service Providers"

## 2021-12-21 ENCOUNTER — Other Ambulatory Visit: Payer: Self-pay

## 2021-12-21 ENCOUNTER — Ambulatory Visit (INDEPENDENT_AMBULATORY_CARE_PROVIDER_SITE_OTHER): Payer: Medicare Other | Admitting: Rehabilitative and Restorative Service Providers"

## 2021-12-21 DIAGNOSIS — R42 Dizziness and giddiness: Secondary | ICD-10-CM

## 2021-12-21 DIAGNOSIS — M25511 Pain in right shoulder: Secondary | ICD-10-CM

## 2021-12-21 DIAGNOSIS — R293 Abnormal posture: Secondary | ICD-10-CM

## 2021-12-21 DIAGNOSIS — M25512 Pain in left shoulder: Secondary | ICD-10-CM

## 2021-12-21 DIAGNOSIS — M6281 Muscle weakness (generalized): Secondary | ICD-10-CM

## 2021-12-21 DIAGNOSIS — G8929 Other chronic pain: Secondary | ICD-10-CM

## 2021-12-21 LAB — HM MAMMOGRAPHY: HM Mammogram: ABNORMAL — AB (ref 0–4)

## 2021-12-23 ENCOUNTER — Telehealth: Payer: Self-pay | Admitting: Physical Medicine and Rehabilitation

## 2021-12-23 NOTE — Telephone Encounter (Signed)
Patient called. Returning a call to [place]. Her call back number is [phone] ?

## 2021-12-27 ENCOUNTER — Other Ambulatory Visit: Payer: Self-pay

## 2022-01-04 ENCOUNTER — Ambulatory Visit: Payer: Medicare Other

## 2022-01-04 ENCOUNTER — Ambulatory Visit: Payer: Medicare Other | Admitting: Internal Medicine

## 2022-01-06 ENCOUNTER — Telehealth: Payer: Medicare Other

## 2022-01-13 ENCOUNTER — Telehealth: Payer: Medicare Other

## 2022-01-13 ENCOUNTER — Ambulatory Visit (INDEPENDENT_AMBULATORY_CARE_PROVIDER_SITE_OTHER): Payer: Medicare Other

## 2022-01-13 DIAGNOSIS — R7303 Prediabetes: Secondary | ICD-10-CM

## 2022-01-13 DIAGNOSIS — I10 Essential (primary) hypertension: Secondary | ICD-10-CM

## 2022-01-13 DIAGNOSIS — N3281 Overactive bladder: Secondary | ICD-10-CM

## 2022-01-13 DIAGNOSIS — J449 Chronic obstructive pulmonary disease, unspecified: Secondary | ICD-10-CM

## 2022-01-13 DIAGNOSIS — M79671 Pain in right foot: Secondary | ICD-10-CM

## 2022-01-16 NOTE — Chronic Care Management (AMB) (Signed)
?Chronic Care Management  ? ?CCM RN Visit Note ? ?01/13/2022 ?Name: Katherine Summers MRN: [id number] DOB: [birth year] ? ?Subjective: ?Katherine Summers is a 76 y.o. year old female who is a primary care patient of Dorothyann Peng, MD. The care management team was consulted for assistance with disease management and care coordination needs.   ? ?Engaged with patient by telephone for follow up visit in response to provider referral for case management and/or care coordination services.  ? ?Consent to Services:  ?The patient was given information about Chronic Care Management services, agreed to services, and gave verbal consent prior to initiation of services.  Please see initial visit note for detailed documentation.  ? ?Patient agreed to services and verbal consent obtained.  ? ?Assessment: Review of patient past medical history, allergies, medications, health status, including review of consultants reports, laboratory and other test data, was performed as part of comprehensive evaluation and provision of chronic care management services.  ? ?SDOH (Social Determinants of Health) assessments and interventions performed:  Rana Snare D referral for assistance with cost of Trelegy ? ?CCM Care Plan ? ?No Known Allergies ? ?Outpatient Encounter Medications as of 01/13/2022  ?Medication Sig  ? albuterol (VENTOLIN HFA) 108 (90 Base) MCG/ACT inhaler Inhale 2 puffs into the lungs every 6 (six) hours as needed for wheezing or shortness of breath.  ? amLODipine (NORVASC) 5 MG tablet TAKE 1 TABLET(5 MG) BY MOUTH DAILY  ? atorvastatin (LIPITOR) 20 MG tablet TAKE 1 TABLET BY MOUTH EVERY DAY (Patient taking differently: Take 1 Monday)  ? escitalopram (LEXAPRO) 10 MG tablet TAKE 1 TABLET(10 MG) BY MOUTH DAILY  ? Fluticasone-Umeclidin-Vilant (TRELEGY ELLIPTA) 100-62.5-25 MCG/ACT AEPB Inhale 1 puff into the lungs daily.  ? Fluticasone-Umeclidin-Vilant (TRELEGY ELLIPTA) 100-62.5-25 MCG/ACT AEPB Inhale 1 puff into the lungs daily.  ?  lisinopril-hydrochlorothiazide (ZESTORETIC) 20-12.5 MG tablet TAKE 1 TABLET BY MOUTH DAILY  ? VITAMIN D PO Take 5,000 mg by mouth. Take 1 daily mon through fri  ? ?No facility-administered encounter medications on file as of 01/13/2022.  ? ? ?Patient Active Problem List  ? Diagnosis Date Noted  ? Unintentional weight loss 12/03/2020  ? Pure hypercholesterolemia 12/03/2020  ? OAB (overactive bladder) 12/03/2020  ? Alkaline phosphatase raised 10/05/2020  ? 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 Family history of malignant neoplasm of gastrointestinal tract 10/05/2020  ? Personal history of colonic polyps 10/05/2020  ? Impairment of balance 12/18/2019  ? Muscle weakness 12/18/2019  ? Urinary frequency 10/31/2018  ? Prediabetes 10/31/2018  ? OSA on CPAP 07/22/2018  ? CPAP use counseling 07/22/2018  ? Abnormal dreams 07/22/2018  ? Chronic obstructive pulmonary disease (HCC) 03/21/2018  ? Other fatigue 03/21/2018  ? Snorings 03/21/2018  ? Nocturia more than twice per night 03/21/2018  ? 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 Pain in right knee 01/04/2018  ? Overweight (BMI 25.0-29.9) 11/01/2016  ? 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 Murmur 08/01/2016  ? Shortness of breath 08/01/2016  ? Abnormal EKG 08/01/2016  ? Essential hypertension 08/01/2016  ? Depression 08/01/2016  ? ? ?Conditions to be addressed/monitored: HTN, COPD, Prediabetes, Overactive bladder, Foot pain, bilateral  ? ?Care Plan : RN Care Manager Plan of Care  ?Updates made by [name], RN since 01/13/2022 12:00 AM  ?  ? ?Problem: No plan of care established for management of chronic disease states (HTN, COPD, Prediabetes, Overactive bladder, Foot pain, bilateral)   ?Priority: High  ?  ? ?Long-Range Goal: Establishment of plan of  care for management of chronic disease states (HTN, COPD, Prediabetes, Overactive bladder, Foot pain, bilateral)   ?Start Date: 10/20/2021  ?Expected End Date: 10/20/2022  ?Recent Progress: On track  ?Priority: High  ?Note:    ?Current Barriers:  ?Knowledge Deficits related to plan of care for management of HTN, COPD, Prediabetes, Overactive bladder, Foot pain, bilateral   ?Chronic Disease Management support and education needs related to HTN, COPD, Prediabetes, Overactive bladder, Foot pain, bilateral   ? ?RNCM Clinical Goal(s):  ?Patient will verbalize basic understanding of  HTN, COPD, Prediabetes, Overactive bladder, Foot pain, bilateral  disease process and self health management plan as evidenced by patient will report having no disease exacerbation related to her chronic disease states as listed above  ?take all medications exactly as prescribed and will call provider for medication related questions as evidenced by will demonstrate improved understanding of prescribed medications and rationale for usage as evidenced by patient teach back ?demonstrate Improved health management independence as evidenced by patient will report 100% adherence to her prescribed treatment plan  ?continue to work with RN Care Manager to address care management and care coordination needs related to  HTN, COPD, Prediabetes, Overactive bladder, Foot pain, bilateral  as evidenced by adherence to CM Team Scheduled appointments ?demonstrate ongoing self health care management ability   as evidenced by    through collaboration with RN Care manager, provider, and care team.  ? ?Interventions: ?1:1 collaboration with primary care provider regarding development and update of comprehensive plan of care as evidenced by provider attestation and co-signature ?Inter-disciplinary care team collaboration (see longitudinal plan of care) ?Evaluation of current treatment plan related to  self management and patient's adherence to plan as established by provider ? ?COPD Interventions:  (Status:  Goal on track:  Yes.) Long Term Goal ?Evaluation of current treatment plan related to COPD self management and patient's adherence to plan as established by provider ?Determined  patient completed her follow up visit with Pulmonologist with the following Assessment/Plan noted: ?Chronic obstructive pulmonary disease, unspecified COPD type (HCC) - Plan: albuterol (VENTOLIN HFA) 108 (90 Base) MCG/ACT inhaler, Fluticasone-Umeclidin-Vilant (TRELEGY ELLIPTA) 100-62.5-25 MCG/ACT AEPB ?Discussion: ?Ernestine ConradBeverly Baldinger is a 76 year old woman, former smoker with hypertension, hyperlipidemia and obstructive sleep apnea who returns to pulmonary clinic for COPD. ?She is to start trelegy ellipta 1 puff daily for COPD. She can use albuterol inhaler as needed. ?She has follow-up with Dr. Vickey Hugerohmeier regarding her obstructive sleep apnea and resuming her use of the CPAP machine. ?Follow-up in 6 months. ?Educated patient on the basic disease process related to COPD ?Advised patient to track and manage COPD triggers ?Provided instruction about proper use of medications used for management of COPD including inhalers, including Trelegy and Albuterol; Mailed printed instructions ?Determined patient continues to have financial difficulty paying for Trelegy, sent embedded Pharm D referral to assist with cost or alternative treatments, patient is aware  ?Advised patient to engage in light exercise as tolerated 3-5 days a week to aid in the the management of COPD; Mailed printed instructions on how to perform chair exercises ?Discussed the importance of adequate rest and management of fatigue with COPD ?Discussed plans with patient for ongoing care management follow up and provided patient with direct contact information for care management team ? ? ?Prediabetes Interventions:  (Status:  Condition stable.  Not addressed this visit.) Long Term Goal ?Assessed patient's understanding of A1c goal:  <5.7 % ?Provided education to patient about basic DM disease process ?Review of patient status,  including review of consultants reports, relevant laboratory and other test results, and medications completed ?Encouraged patient to  continue to adhere to a carb friendly diet and to implement a home exercise regimen shooting for 150 minutes weekly as tolerated ?Lab Results  ?Component Value Date  ? HGBA1C 5.8 (H) 11/21/2021  ?Impaired Urinary Eliminatio

## 2022-01-16 NOTE — Patient Instructions (Signed)
Visit Information ? ?Thank you for taking time to visit with me today. Please don't hesitate to contact me if I can be of assistance to you before our next scheduled telephone appointment. ? ?Following are the goals we discussed today:  ?(Copy and paste patient goals from clinical care plan here) ? ?Our next appointment is by telephone on 03/27/22 at 11:15 AM  ? ?Please call the care guide team at [phone] if you need to cancel or reschedule your appointment.  ? ?If you are experiencing a Mental Health or Behavioral Health Crisis or need someone to talk to, please call 1-800-273-TALK (toll free, 24 hour hotline)  ? ?The patient verbalized understanding of instructions, educational materials, and care plan provided today and agreed to receive a mailed copy of patient instructions, educational materials, and care plan.  ? ?Delsa Sale, RN, BSN, CCM ?Care Management Coordinator ?[facility] Care Management/[facility]  ?Direct Phone: [phone] ? ? ?

## 2022-01-26 ENCOUNTER — Encounter: Payer: Self-pay | Admitting: Internal Medicine

## 2022-01-26 ENCOUNTER — Ambulatory Visit (INDEPENDENT_AMBULATORY_CARE_PROVIDER_SITE_OTHER): Payer: Medicare Other

## 2022-01-26 ENCOUNTER — Ambulatory Visit (INDEPENDENT_AMBULATORY_CARE_PROVIDER_SITE_OTHER): Payer: Medicare Other | Admitting: Internal Medicine

## 2022-01-26 VITALS — BP 124/80 | HR 88 | Temp 98.5°F | Ht 64.6 in | Wt 174.0 lb

## 2022-01-26 VITALS — BP 124/80 | HR 88 | Temp 98.5°F | Ht 64.6 in | Wt 174.6 lb

## 2022-01-26 DIAGNOSIS — M791 Myalgia, unspecified site: Secondary | ICD-10-CM

## 2022-01-26 DIAGNOSIS — I10 Essential (primary) hypertension: Secondary | ICD-10-CM

## 2022-01-26 DIAGNOSIS — Z0000 Encounter for general adult medical examination without abnormal findings: Secondary | ICD-10-CM | POA: Diagnosis not present

## 2022-01-26 DIAGNOSIS — E876 Hypokalemia: Secondary | ICD-10-CM | POA: Diagnosis not present

## 2022-01-26 DIAGNOSIS — E7800 Pure hypercholesterolemia, unspecified: Secondary | ICD-10-CM

## 2022-01-26 DIAGNOSIS — Z23 Encounter for immunization: Secondary | ICD-10-CM | POA: Diagnosis not present

## 2022-01-26 NOTE — Progress Notes (Signed)
Rich Brave [place]leton,acting as a Education administrator for Maximino Greenland, MD.,have documented all relevant documentation on the behalf of Maximino Greenland, MD,as directed by  Maximino Greenland, MD while in the presence of Maximino Greenland, MD.  This visit occurred during the SARS-CoV-2 public health emergency.  Safety protocols were in place, including screening questions prior to the visit, additional usage of staff PPE, and extensive cleaning of exam room while observing appropriate contact time as indicated for disinfecting solutions.  Subjective:     Patient ID: Katherine Summers , female    DOB: May 11, 1946 , 76 y.o.   MRN: [id number]   Chief Complaint  Patient presents with   Hypertension    HPI  She is here today for a bp and chol check. Patient reports she is having leg cramps after taking the atorvastatin. She has no other concerns at this time.   She is also scheduled for AWV with [facility] advisor today too.   Hypertension This is a chronic problem. The current episode started more than 1 year ago. The problem has been gradually improving since onset. The problem is controlled. Pertinent negatives include no blurred vision, chest pain, palpitations or shortness of breath. The current treatment provides moderate improvement. Compliance problems include exercise.     Past Medical History:  Diagnosis Date   Allergy    CKD (chronic kidney disease) stage 2, GFR 60-89 ml/min    COPD (chronic obstructive pulmonary disease) (HCC)    Depression 08/01/2016   Dizziness and giddiness    Essential hypertension 08/01/2016   Hyperlipidemia    Hypertension    Murmur 08/01/2016   Pre-diabetes    Shortness of breath 08/01/2016   Vitamin D deficiency      Family History  Problem Relation Age of Onset   Stroke Mother    Colon cancer Father    Colon cancer Sister    Stroke Maternal Grandmother    Colon cancer Paternal Grandfather      Current Outpatient Medications:    albuterol (VENTOLIN HFA) 108 (90  Base) MCG/ACT inhaler, Inhale 2 puffs into the lungs every 6 (six) hours as needed for wheezing or shortness of breath., Disp: 8 g, Rfl: 6   amLODipine (NORVASC) 5 MG tablet, TAKE 1 TABLET(5 MG) BY MOUTH DAILY, Disp: 90 tablet, Rfl: 2   atorvastatin (LIPITOR) 20 MG tablet, TAKE 1 TABLET BY MOUTH EVERY DAY (Patient taking differently: Take 1 tab Monday, Wednesday and Friday.), Disp: 90 tablet, Rfl: 2   escitalopram (LEXAPRO) 10 MG tablet, TAKE 1 TABLET(10 MG) BY MOUTH DAILY, Disp: 90 tablet, Rfl: 1   Fluticasone-Umeclidin-Vilant (TRELEGY ELLIPTA) 100-62.5-25 MCG/ACT AEPB, Inhale 1 puff into the lungs daily., Disp: 28 each, Rfl: 11   lisinopril-hydrochlorothiazide (ZESTORETIC) 20-12.5 MG tablet, TAKE 1 TABLET BY MOUTH DAILY, Disp: 90 tablet, Rfl: 2   VITAMIN D PO, Take 5,000 mg by mouth. Take 1 daily mon through fri, Disp: , Rfl:    No Known Allergies   Review of Systems  Constitutional: Negative.   Eyes:  Negative for blurred vision.  Respiratory: Negative.  Negative for shortness of breath.   Cardiovascular: Negative.  Negative for chest pain and palpitations.  Gastrointestinal: Negative.   Musculoskeletal:  Positive for myalgias.  Neurological: Negative.   Psychiatric/Behavioral: Negative.      Today's Vitals   01/26/22 1122  BP: 124/80  Pulse: 88  Temp: 98.5 F (36.9 C)  Weight: 174 lb 9.6 oz (79.2 kg)  Height: 5' 4.6" (1.641 m)  PainSc: 0-No pain   Body mass index is 29.42 kg/m.  Wt Readings from Last 3 Encounters:  01/26/22 174 lb (78.9 kg)  01/26/22 174 lb 9.6 oz (79.2 kg)  12/05/21 170 lb (77.1 kg)    Objective:  Physical Exam Vitals and nursing note reviewed.  Constitutional:      Appearance: Normal appearance.  HENT:     Head: Normocephalic and atraumatic.  Eyes:     Extraocular Movements: Extraocular movements intact.  Cardiovascular:     Rate and Rhythm: Normal rate and regular rhythm.     Heart sounds: Normal heart sounds.  Pulmonary:     Effort: Pulmonary  effort is normal.     Breath sounds: Normal breath sounds.  Musculoskeletal:     Cervical back: Normal range of motion.  Skin:    General: Skin is warm.  Neurological:     General: No focal deficit present.     Mental Status: She is alert.  Psychiatric:        Mood and Affect: Mood normal.        Behavior: Behavior normal.        Assessment And Plan:     1. Pure hypercholesterolemia Comments: Chronic, I will check lipid panel and ALT today. She is encouraged to avoid fried foods, perform chair exercises and increase fish/fiber intake.   2. Essential hypertension Comments: Chronic, well controlled. She is encouraged to follow low sodium diet.   3. Myalgia Comments: I will check PCK today. She is encouraged to stay well hydrated. May benefit from CoQ10 and/or magnesium supplementation.  - CK, total  4. Hypokalemia Comments: Previous labs reviewed. She was given a list of potassium rich foods. I will check repeat potassium level today. - BMP8+EGFR  5. Immunization due Comments: She was given Shingrix IM x 1. This will be billed via [employer].  - Varicella-zoster vaccine IM (Shingrix)   Patient was given opportunity to ask questions. Patient verbalized understanding of the plan and was able to repeat key elements of the plan. All questions were answered to their satisfaction.   I, Maximino Greenland, MD, have reviewed all documentation for this visit. The documentation on 01/26/22 for the exam, diagnosis, procedures, and orders are all accurate and complete.   IF YOU HAVE BEEN REFERRED TO A SPECIALIST, IT MAY TAKE 1-2 WEEKS TO SCHEDULE/PROCESS THE REFERRAL. IF YOU HAVE NOT HEARD FROM US/SPECIALIST IN TWO WEEKS, PLEASE GIVE [place] A CALL AT [phone].   THE PATIENT IS ENCOURAGED TO PRACTICE SOCIAL DISTANCING DUE TO THE COVID-19 PANDEMIC.

## 2022-01-26 NOTE — Patient Instructions (Signed)
Ms. Winterhalter , Thank you for taking time to come for your Medicare Wellness Visit. I appreciate your ongoing commitment to your health goals. Please review the following plan we discussed and let me know if I can assist you in the future.   Screening recommendations/referrals: Colonoscopy: completed 09/03/2019, due 09/02/2022 Mammogram: completed 12/21/2021 Bone Density: completed 10/04/2018 Recommended yearly ophthalmology/optometry visit for glaucoma screening and checkup Recommended yearly dental visit for hygiene and checkup  Vaccinations: Influenza vaccine: due 04/04/2022 Pneumococcal vaccine: completed 11/21/2021 Tdap vaccine: completed 06/30/2013, due 07/01/2023 Shingles vaccine: 1st dose today   Covid-19: 07/05/2020, 11/15/2019, 10/18/2019  Advanced directives: Please bring a copy of your POA (Power of Attorney) and/or Living Will to your next appointment.   Conditions/risks identified: none  Next appointment: Follow up in one year for your annual wellness visit    Preventive Care 65 Years and Older, Female Preventive care refers to lifestyle choices and visits with your health care provider that can promote health and wellness. What does preventive care include? A yearly physical exam. This is also called an annual well check. Dental exams once or twice a year. Routine eye exams. Ask your health care provider how often you should have your eyes checked. Personal lifestyle choices, including: Daily care of your teeth and gums. Regular physical activity. Eating a healthy diet. Avoiding tobacco and drug use. Limiting alcohol use. Practicing safe sex. Taking low-dose aspirin every day. Taking vitamin and mineral supplements as recommended by your health care provider. What happens during an annual well check? The services and screenings done by your health care provider during your annual well check will depend on your age, overall health, lifestyle risk factors, and family history  of disease. Counseling  Your health care provider may ask you questions about your: Alcohol use. Tobacco use. Drug use. Emotional well-being. Home and relationship well-being. Sexual activity. Eating habits. History of falls. Memory and ability to understand (cognition). Work and work Astronomer. Reproductive health. Screening  You may have the following tests or measurements: Height, weight, and BMI. Blood pressure. Lipid and cholesterol levels. These may be checked every 5 years, or more frequently if you are over 23 years old. Skin check. Lung cancer screening. You may have this screening every year starting at age 50 if you have a 30-pack-year history of smoking and currently smoke or have quit within the past 15 years. Fecal occult blood test (FOBT) of the stool. You may have this test every year starting at age 31. Flexible sigmoidoscopy or colonoscopy. You may have a sigmoidoscopy every 5 years or a colonoscopy every 10 years starting at age 47. Hepatitis C blood test. Hepatitis B blood test. Sexually transmitted disease (STD) testing. Diabetes screening. This is done by checking your blood sugar (glucose) after you have not eaten for a while (fasting). You may have this done every 1-3 years. Bone density scan. This is done to screen for osteoporosis. You may have this done starting at age 80. Mammogram. This may be done every 1-2 years. Talk to your health care provider about how often you should have regular mammograms. Talk with your health care provider about your test results, treatment options, and if necessary, the need for more tests. Vaccines  Your health care provider may recommend certain vaccines, such as: Influenza vaccine. This is recommended every year. Tetanus, diphtheria, and acellular pertussis (Tdap, Td) vaccine. You may need a Td booster every 10 years. Zoster vaccine. You may need this after age 63. Pneumococcal 13-valent conjugate (  PCV13) vaccine. One  dose is recommended after age 4. Pneumococcal polysaccharide (PPSV23) vaccine. One dose is recommended after age 90. Talk to your health care provider about which screenings and vaccines you need and how often you need them. This information is not intended to replace advice given to you by your health care provider. Make sure you discuss any questions you have with your health care provider. Document Released: 09/17/2015 Document Revised: 05/10/2016 Document Reviewed: 06/22/2015 Elsevier Interactive Patient Education  2017 [place] Prevention in the Home Falls can cause injuries. They can happen to people of all ages. There are many things you can do to make your home safe and to help prevent falls. What can I do on the outside of my home? Regularly fix the edges of walkways and driveways and fix any cracks. Remove anything that might make you trip as you walk through a door, such as a raised step or threshold. Trim any bushes or trees on the path to your home. Use bright outdoor lighting. Clear any walking paths of anything that might make someone trip, such as rocks or tools. Regularly check to see if handrails are loose or broken. Make sure that both sides of any steps have handrails. Any raised decks and porches should have guardrails on the edges. Have any leaves, snow, or ice cleared regularly. Use sand or salt on walking paths during winter. Clean up any spills in your garage right away. This includes oil or grease spills. What can I do in the bathroom? Use night lights. Install grab bars by the toilet and in the tub and shower. Do not use towel bars as grab bars. Use non-skid mats or decals in the tub or shower. If you need to sit down in the shower, use a plastic, non-slip stool. Keep the floor dry. Clean up any water that spills on the floor as soon as it happens. Remove soap buildup in the tub or shower regularly. Attach bath mats securely with double-sided  non-slip rug tape. Do not have throw rugs and other things on the floor that can make you trip. What can I do in the bedroom? Use night lights. Make sure that you have a light by your bed that is easy to reach. Do not use any sheets or blankets that are too big for your bed. They should not hang down onto the floor. Have a firm chair that has side arms. You can use this for support while you get dressed. Do not have throw rugs and other things on the floor that can make you trip. What can I do in the kitchen? Clean up any spills right away. Avoid walking on wet floors. Keep items that you use a lot in easy-to-reach places. If you need to reach something above you, use a strong step stool that has a grab bar. Keep electrical cords out of the way. Do not use floor polish or wax that makes floors slippery. If you must use wax, use non-skid floor wax. Do not have throw rugs and other things on the floor that can make you trip. What can I do with my stairs? Do not leave any items on the stairs. Make sure that there are handrails on both sides of the stairs and use them. Fix handrails that are broken or loose. Make sure that handrails are as long as the stairways. Check any carpeting to make sure that it is firmly attached to the stairs. Fix any carpet that is  loose or worn. Avoid having throw rugs at the top or bottom of the stairs. If you do have throw rugs, attach them to the floor with carpet tape. Make sure that you have a light switch at the top of the stairs and the bottom of the stairs. If you do not have them, ask someone to add them for you. What else can I do to help prevent falls? Wear shoes that: Do not have high heels. Have rubber bottoms. Are comfortable and fit you well. Are closed at the toe. Do not wear sandals. If you use a stepladder: Make sure that it is fully opened. Do not climb a closed stepladder. Make sure that both sides of the stepladder are locked into place. Ask  someone to hold it for you, if possible. Clearly mark and make sure that you can see: Any grab bars or handrails. First and last steps. Where the edge of each step is. Use tools that help you move around (mobility aids) if they are needed. These include: Canes. Walkers. Scooters. Crutches. Turn on the lights when you go into a dark area. Replace any light bulbs as soon as they burn out. Set up your furniture so you have a clear path. Avoid moving your furniture around. If any of your floors are uneven, fix them. If there are any pets around you, be aware of where they are. Review your medicines with your doctor. Some medicines can make you feel dizzy. This can increase your chance of falling. Ask your doctor what other things that you can do to help prevent falls. This information is not intended to replace advice given to you by your health care provider. Make sure you discuss any questions you have with your health care provider. Document Released: 06/17/2009 Document Revised: 01/27/2016 Document Reviewed: 09/25/2014 Elsevier Interactive Patient Education  2017 [employer].

## 2022-01-26 NOTE — Patient Instructions (Signed)
Hypertension, Adult ?Hypertension is another name for high blood pressure. High blood pressure forces your heart to work harder to pump blood. This can cause problems over time. ?There are two numbers in a blood pressure reading. There is a top number (systolic) over a bottom number (diastolic). It is best to have a blood pressure that is below 120/80. ?What are the causes? ?The cause of this condition is not known. Some other conditions can lead to high blood pressure. ?What increases the risk? ?Some lifestyle factors can make you more likely to develop high blood pressure: ?Smoking. ?Not getting enough exercise or physical activity. ?Being overweight. ?Having too much fat, sugar, calories, or salt (sodium) in your diet. ?Drinking too much alcohol. ?Other risk factors include: ?Having any of these conditions: ?Heart disease. ?Diabetes. ?High cholesterol. ?Kidney disease. ?Obstructive sleep apnea. ?Having a family history of high blood pressure and high cholesterol. ?Age. The risk increases with age. ?Stress. ?What are the signs or symptoms? ?High blood pressure may not cause symptoms. Very high blood pressure (hypertensive crisis) may cause: ?Headache. ?Fast or uneven heartbeats (palpitations). ?Shortness of breath. ?Nosebleed. ?Vomiting or feeling like you may vomit (nauseous). ?Changes in how you see. ?Very bad chest pain. ?Feeling dizzy. ?Seizures. ?How is this treated? ?This condition is treated by making healthy lifestyle changes, such as: ?Eating healthy foods. ?Exercising more. ?Drinking less alcohol. ?Your doctor may prescribe medicine if lifestyle changes do not help enough and if: ?Your top number is above 130. ?Your bottom number is above 80. ?Your personal target blood pressure may vary. ?Follow these instructions at home: ?Eating and drinking ? ?If told, follow the DASH eating plan. To follow this plan: ?Fill one half of your plate at each meal with fruits and vegetables. ?Fill one fourth of your plate  at each meal with whole grains. Whole grains include whole-wheat pasta, brown rice, and whole-grain bread. ?Eat or drink low-fat dairy products, such as skim milk or low-fat yogurt. ?Fill one fourth of your plate at each meal with low-fat (lean) proteins. Low-fat proteins include fish, chicken without skin, eggs, beans, and tofu. ?Avoid fatty meat, cured and processed meat, or chicken with skin. ?Avoid pre-made or processed food. ?Limit the amount of salt in your diet to less than 1,500 mg each day. ?Do not drink alcohol if: ?Your doctor tells you not to drink. ?You are pregnant, may be pregnant, or are planning to become pregnant. ?If you drink alcohol: ?Limit how much you have to: ?0-1 drink a day for women. ?0-2 drinks a day for men. ?Know how much alcohol is in your drink. In the [place] one drink equals one 12 oz bottle of beer (355 mL), one 5 oz glass of wine (148 mL), or one 1? oz glass of hard liquor (44 mL). ?Lifestyle ? ?Work with your doctor to stay at a healthy weight or to lose weight. Ask your doctor what the best weight is for you. ?Get at least 30 minutes of exercise that causes your heart to beat faster (aerobic exercise) most days of the week. This may include walking, swimming, or biking. ?Get at least 30 minutes of exercise that strengthens your muscles (resistance exercise) at least 3 days a week. This may include lifting weights or doing Pilates. ?Do not smoke or use any products that contain nicotine or tobacco. If you need help quitting, ask your doctor. ?Check your blood pressure at home as told by your doctor. ?Keep all follow-up visits. ?Medicines ?Take over-the-counter and prescription medicines   only as told by your doctor. Follow directions carefully. ?Do not skip doses of blood pressure medicine. The medicine does not work as well if you skip doses. Skipping doses also puts you at risk for problems. ?Ask your doctor about side effects or reactions to medicines that you should watch  for. ?Contact a doctor if: ?You think you are having a reaction to the medicine you are taking. ?You have headaches that keep coming back. ?You feel dizzy. ?You have swelling in your ankles. ?You have trouble with your vision. ?Get help right away if: ?You get a very bad headache. ?You start to feel mixed up (confused). ?You feel weak or numb. ?You feel faint. ?You have very bad pain in your: ?Chest. ?Belly (abdomen). ?You vomit more than once. ?You have trouble breathing. ?These symptoms may be an emergency. Get help right away. Call 911. ?Do not wait to see if the symptoms will go away. ?Do not drive yourself to the hospital. ?Summary ?Hypertension is another name for high blood pressure. ?High blood pressure forces your heart to work harder to pump blood. ?For most people, a normal blood pressure is less than 120/80. ?Making healthy choices can help lower blood pressure. If your blood pressure does not get lower with healthy choices, you may need to take medicine. ?This information is not intended to replace advice given to you by your health care provider. Make sure you discuss any questions you have with your health care provider. ?Document Revised: 06/09/2021 Document Reviewed: 06/09/2021 ?Elsevier Patient Education ? 2023 [employer] ? ?

## 2022-01-26 NOTE — Progress Notes (Signed)
Subjective:   Katherine Summers is a 76 y.o. female who presents for Medicare Annual (Subsequent) preventive examination.  Review of Systems     Cardiac Risk Factors include: advanced age (>[id number], 74>65 women);hypertension     Objective:    Today's Vitals   01/26/22 1154  BP: 124/80  Pulse: 88  Temp: 98.5 F (36.9 C)  TempSrc: Oral  Weight: 174 lb (78.9 kg)  Height: 5' 4.6" (1.641 m)   Body mass index is 29.31 kg/m.     01/26/2022   12:02 PM 12/21/2021   11:43 AM 12/22/2020   12:09 PM 11/12/2019    9:28 AM 03/18/2019   10:05 AM 10/31/2018   10:30 AM 02/24/2018    1:54 PM  Advanced Directives  Does Patient Have a Medical Advance Directive? Yes Yes Yes Yes Yes Yes No  Type of Estate agentAdvance Directive Healthcare Power of [place];Living will Living will;Healthcare Power of State Street Corporationttorney Healthcare Power of [place];Living will Healthcare Power of [place];Living will Healthcare Power of [place];Living will Living will;Healthcare Power of Attorney   Does patient want to make changes to medical advance directive?  No - Patient declined   No - Patient declined No - Patient declined   Copy of Healthcare Power of Attorney in Chart? No - copy requested No - copy requested No - copy requested No - copy requested No - copy requested No - copy requested   Would patient like information on creating a medical advance directive?       No - Patient declined    Current Medications (verified) Outpatient Encounter Medications as of 01/26/2022  Medication Sig   albuterol (VENTOLIN HFA) 108 (90 Base) MCG/ACT inhaler Inhale 2 puffs into the lungs every 6 (six) hours as needed for wheezing or shortness of breath.   amLODipine (NORVASC) 5 MG tablet TAKE 1 TABLET(5 MG) BY MOUTH DAILY   atorvastatin (LIPITOR) 20 MG tablet TAKE 1 TABLET BY MOUTH EVERY DAY (Patient taking differently: Take 1 tab Monday, Wednesday and Friday.)   escitalopram (LEXAPRO) 10 MG tablet TAKE 1 TABLET(10 MG) BY MOUTH DAILY    Fluticasone-Umeclidin-Vilant (TRELEGY ELLIPTA) 100-62.5-25 MCG/ACT AEPB Inhale 1 puff into the lungs daily.   lisinopril-hydrochlorothiazide (ZESTORETIC) 20-12.5 MG tablet TAKE 1 TABLET BY MOUTH DAILY   VITAMIN D PO Take 5,000 mg by mouth. Take 1 daily mon through fri   No facility-administered encounter medications on file as of 01/26/2022.    Allergies (verified) Patient has no known allergies.   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   Procedure: RIGHT TOTAL KNEE ARTHROPLASTY;  Surgeon: Durene RomansMatthew Olin, MD;  Location: [facility];  Service: Orthopedics;  Laterality: Right;  Requesting for 70 mins   TUBAL LIGATION     Family History  Problem Relation Age of Onset   Stroke Mother    Colon cancer Father  Colon cancer Sister    Stroke Maternal Grandmother    Colon cancer Paternal Grandfather    Social History   Socioeconomic History   Marital status: Single    Spouse name: Not on file   Number of children: Not on file   Years of education: Not on file   Highest education level: Not on file  Occupational History   Occupation: retired  Tobacco Use   Smoking status: Former    Packs/day: 0.25    Years: 12.00    Pack years: 3.00    Types: Cigarettes    Quit date: 1998    Years since quitting: 25.4   Smokeless tobacco: Never  Vaping Use   Vaping Use: Never used  Substance and Sexual Activity   Alcohol use: Not Currently     Comment: corona ever now and then   Drug use: No   Sexual activity: Not Currently  Other Topics Concern   Not on file  Social History Narrative   Not on file   Social Determinants of Health   Financial Resource Strain: Low Risk    Difficulty of Paying Living Expenses: Not hard at all  Food Insecurity: No Food Insecurity   Worried About Programme researcher, broadcasting/film/video in the Last Year: Never true   Barista in the Last Year: Never true  Transportation Needs: No Transportation Needs   Lack of Transportation (Medical): No   Lack of Transportation (Non-Medical): No  Physical Activity: Inactive   Days of Exercise per Week: 0 days   Minutes of Exercise per Session: 0 min  Stress: No Stress Concern Present   Feeling of Stress : Not at all  Social Connections: Not on file    Tobacco Counseling Counseling given: Not Answered   Clinical Intake:  Pre-visit preparation completed: Yes  Pain : No/denies pain     Nutritional Status: BMI 25 -29 Overweight Nutritional Risks: None Diabetes: No  How often do you need to have someone help you when you read instructions, pamphlets, or other written materials from your doctor or pharmacy?: 1 - Never What is the last grade level you completed in school?: 45yrs college  Diabetic? no  Interpreter Needed?: No  Information entered by :: NAllen LPN   Activities of Daily Living    01/26/2022   12:04 PM  In your present state of health, do you have any difficulty performing the following activities:  Hearing? 0  Comment just a little  Vision? 0  Difficulty concentrating or making decisions? 0  Walking or climbing stairs? 1  Dressing or bathing? 0  Doing errands, shopping? 0  Preparing Food and eating ? N  Using the Toilet? N  In the past six months, have you accidently leaked urine? Y  Do you have problems with loss of bowel control? N  Managing your Medications? N  Managing your Finances? N  Housekeeping or managing your  Housekeeping? N    Patient Care Team: Dorothyann Peng, MD as PCP - General (Internal Medicine) Clarene Duke Karma Lew, RN as Case Manager Harlan Stains, [facility] (Pharmacist)  Indicate any recent Medical Services you may have received from other than [facility] providers in the past year (date may be approximate).     Assessment:   This is a routine wellness examination for [place].  Hearing/Vision screen Vision Screening - Comments:: Regular eye exams, Dr. Conley Rolls  Dietary issues and exercise activities discussed: Current Exercise Habits: The patient does not participate in regular exercise at  present   Goals Addressed             This Visit's Progress    Patient Stated       01/26/2022, wants to walk without cane       Depression Screen    01/26/2022   12:04 PM 11/21/2021    8:25 AM 06/29/2021   10:58 AM 12/22/2020   12:09 PM 10/19/2020    1:03 PM 11/12/2019    9:29 AM 03/18/2019   10:05 AM  PHQ 2/9 Scores  PHQ - 2 Score 0 0 0 0 1 0 0  PHQ- 9 Score  0 0   3     Fall Risk    01/26/2022   12:03 PM 01/26/2022   11:24 AM 12/22/2020   12:09 PM 11/17/2020    9:08 AM 11/12/2019    9:28 AM  Fall Risk   Falls in the past year? 0 0 1 0 1  Comment     tripped  Number falls in past yr: 0 0   1  Injury with Fall? 0 0   0  Risk for fall due to : Medication side effect No Fall Risks Medication side effect  Impaired balance/gait;Medication side effect  Follow up Falls evaluation completed;Education provided;Falls prevention discussed Falls evaluation completed Falls evaluation completed;Education provided;Falls prevention discussed  Falls evaluation completed;Education provided;Falls prevention discussed    FALL RISK PREVENTION PERTAINING TO THE HOME:  Any stairs in or around the home? No  If so, are there any without handrails? N/a Home free of loose throw rugs in walkways, pet beds, electrical cords, etc? Yes  Adequate lighting in your home to reduce risk of falls? Yes   ASSISTIVE DEVICES  UTILIZED TO PREVENT FALLS:  Life alert? No  Use of a cane, walker or w/c? Yes  Grab bars in the bathroom? Yes  Shower chair or bench in shower? Yes  Elevated toilet seat or a handicapped toilet? Yes   TIMED UP AND GO:  Was the test performed? No .     Gait slow and steady with assistive device  Cognitive Function:        01/26/2022   12:05 PM 12/22/2020   12:12 PM 11/12/2019    9:32 AM 10/31/2018   10:34 AM  6CIT Screen  What Year? 0 points 0 points 0 points 0 points  What month? 0 points 0 points 0 points 0 points  What time? 0 points 0 points 0 points 0 points  Count back from 20 0 points 0 points 0 points 0 points  Months in reverse 0 points 0 points 0 points 0 points  Repeat phrase 2 points 0 points 2 points 0 points  Total Score 2 points 0 points 2 points 0 points    Immunizations Immunization History  Administered Date(s) Administered   Moderna SARS-COV2 Booster Vaccination 07/05/2020   Moderna Sars-Covid-2 Vaccination 10/18/2019, 11/15/2019   PPD Test 12/05/2016   Pneumococcal Conjugate-13 10/31/2018   Pneumococcal Polysaccharide-23 11/21/2021   Zoster Recombinat (Shingrix) 01/26/2022    TDAP status: Up to date  Flu Vaccine status: Declined, Education has been provided regarding the importance of this vaccine but patient still declined. Advised may receive this vaccine at local pharmacy or Health Dept. Aware to provide a copy of the vaccination record if obtained from local pharmacy or Health Dept. Verbalized acceptance and understanding.  Pneumococcal vaccine status: Up to date  Covid-19 vaccine status: Completed vaccines  Qualifies for Shingles Vaccine? Yes   Zostavax  completed No   Shingrix Completed?: needs second dose  Screening Tests Health Maintenance  Topic Date Due   COVID-19 Vaccine (3 - Moderna risk series) 08/02/2020   Zoster Vaccines- Shingrix (2 of 2) 03/23/2022   INFLUENZA VACCINE  04/04/2022   TETANUS/TDAP  07/01/2023   MAMMOGRAM   12/22/2023   COLONOSCOPY (Pts 45-45yrs Insurance coverage will need to be confirmed)  09/02/2029   Pneumonia Vaccine 52+ Years old  Completed   DEXA SCAN  Completed   Hepatitis C Screening  Completed   HPV VACCINES  Aged Out    Health Maintenance  Health Maintenance Due  Topic Date Due   COVID-19 Vaccine (3 - Moderna risk series) 08/02/2020    Colorectal cancer screening: Type of screening: Colonoscopy. Completed 09/03/2019. Repeat every 3 years  Mammogram status: Completed 12/21/2021. Repeat every year  Bone Density status: Completed 10/04/2018.   Lung Cancer Screening: (Low Dose CT Chest recommended if Age 110-80 years, 30 pack-year currently smoking OR have quit w/in 15years.) does not qualify.   Lung Cancer Screening Referral: no  Additional Screening:  Hepatitis C Screening: does qualify; Completed 01/02/2014  Vision Screening: Recommended annual ophthalmology exams for early detection of glaucoma and other disorders of the eye. Is the patient up to date with their annual eye exam?  Yes  Who is the provider or what is the name of the office in which the patient attends annual eye exams? Dr. Conley Rolls If pt is not established with a provider, would they like to be referred to a provider to establish care? No .   Dental Screening: Recommended annual dental exams for proper oral hygiene  Community Resource Referral / Chronic Care Management: CRR required this visit?  No   CCM required this visit?  No      Plan:     I have personally reviewed and noted the following in the patient's chart:   Medical and social history Use of alcohol, tobacco or illicit drugs  Current medications and supplements including opioid prescriptions.  Functional ability and status Nutritional status Physical activity Advanced directives List of other physicians Hospitalizations, surgeries, and ER visits in previous 12 months Vitals Screenings to include cognitive, depression, and  falls Referrals and appointments  In addition, I have reviewed and discussed with patient certain preventive protocols, quality metrics, and best practice recommendations. A written personalized care plan for preventive services as well as general preventive health recommendations were provided to patient.     Barb Merino, LPN   1/61/0960   Nurse Notes: none

## 2022-01-27 LAB — BMP8+EGFR
BUN/Creatinine Ratio: 19 (ref 12–28)
BUN: 17 mg/dL (ref 8–27)
CO2: 24 mmol/L (ref 20–29)
Calcium: 9.8 mg/dL (ref 8.7–10.3)
Chloride: 102 mmol/L (ref 96–106)
Creatinine, Ser: 0.91 mg/dL (ref 0.57–1.00)
Glucose: 77 mg/dL (ref 70–99)
Potassium: 3.9 mmol/L (ref 3.5–5.2)
Sodium: 142 mmol/L (ref 134–144)
eGFR: 66 mL/min/{1.73_m2} (ref >59)

## 2022-01-27 LAB — CK: Total CK: 102 U/L (ref 32–182)

## 2022-02-01 ENCOUNTER — Encounter: Payer: Self-pay | Admitting: Internal Medicine

## 2022-02-01 DIAGNOSIS — I10 Essential (primary) hypertension: Secondary | ICD-10-CM

## 2022-02-01 DIAGNOSIS — J449 Chronic obstructive pulmonary disease, unspecified: Secondary | ICD-10-CM

## 2022-02-01 DIAGNOSIS — Z87891 Personal history of nicotine dependence: Secondary | ICD-10-CM

## 2022-02-01 DIAGNOSIS — R7303 Prediabetes: Secondary | ICD-10-CM

## 2022-02-01 DIAGNOSIS — R52 Pain, unspecified: Secondary | ICD-10-CM

## 2022-02-01 DIAGNOSIS — R3981 Functional urinary incontinence: Secondary | ICD-10-CM

## 2022-02-03 ENCOUNTER — Telehealth: Payer: Self-pay

## 2022-02-03 NOTE — Telephone Encounter (Signed)
Patient has been notified that her letter and form has been completed and mailed. YL,RMA

## 2022-02-09 ENCOUNTER — Telehealth: Payer: Self-pay

## 2022-02-09 NOTE — Chronic Care Management (AMB) (Cosign Needed Addendum)
02-09-2022: Left VM to schedule follow up telephone visit with Cherylin Mylar CPP.  02-14-2022: Left VM to schedule follow up telephone visit with Cherylin Mylar CPP.  02-21-2022: Left VM to schedule follow up telephone visit with Cherylin Mylar CPP.  Huey Romans [facility] Clinical Pharmacist Assistant [phone]

## 2022-02-27 ENCOUNTER — Ambulatory Visit: Payer: Medicare Other | Admitting: Neurology

## 2022-02-28 ENCOUNTER — Other Ambulatory Visit: Payer: Self-pay | Admitting: Internal Medicine

## 2022-03-08 ENCOUNTER — Telehealth: Payer: Self-pay

## 2022-03-08 NOTE — Chronic Care Management (AMB) (Signed)
Chronic Care Management Pharmacy Assistant   Name: Katherine Summers  MRN: [id number] DOB: 02/10/1946   Reason for Encounter: Disease State/ Hypertension  Recent office visits:  01-26-2022 Barb Merino, LPN. Medicare annual wellness visit.   01-26-2022 Dorothyann Peng, MD. Follow up visit no changes.  01-13-2022 Little, Karma Lew, RN (CCM).  11-23-2021 Little, Karma Lew, RN (CCM).   11-21-2021 Dorothyann Peng, MD. Glucose= 101, Potassium= 3.4. A1C= 5.8. Abnormal UA. EKG completed. Referral placed to orthopedic surgery. STOP Opticrom drops, melatonin, naproxen, maxitrol, nystatin, oxybutynin chloride, prednisolone drops and Polyethylene Glycol.   10-20-2021 Little, Karma Lew, RN (CCM).  Recent consult visits:  12-21-2021 Barbaraann Rondo, PT (Physical therapy). Initial visit for shoulder pain.  12-13-2021 Cristie Hem, PA-C (Orthopedic surgery). XR of left and right shoulder completed. Referral placed to physical therapy and physical medicine rehab.   12-05-2021 Martina Sinner, MD (Pulmonary). STOP Anoro ellipta. START albuterol inhaler 2 puffs every 6 hours as needed and trelegy ellipta 1 puff daily.  Hospital visits:  None in previous 6 months  Medications: Outpatient Encounter Medications as of 03/08/2022  Medication Sig   albuterol (VENTOLIN HFA) 108 (90 Base) MCG/ACT inhaler Inhale 2 puffs into the lungs every 6 (six) hours as needed for wheezing or shortness of breath.   amLODipine (NORVASC) 5 MG tablet TAKE 1 TABLET(5 MG) BY MOUTH DAILY   atorvastatin (LIPITOR) 20 MG tablet TAKE 1 TABLET BY MOUTH EVERY DAY (Patient taking differently: Take 1 tab Monday, Wednesday and Friday.)   escitalopram (LEXAPRO) 10 MG tablet TAKE 1 TABLET(10 MG) BY MOUTH DAILY   Fluticasone-Umeclidin-Vilant (TRELEGY ELLIPTA) 100-62.5-25 MCG/ACT AEPB Inhale 1 puff into the lungs daily.   lisinopril-hydrochlorothiazide (ZESTORETIC) 20-12.5 MG tablet TAKE 1 TABLET BY MOUTH DAILY   VITAMIN D PO Take  5,000 mg by mouth. Take 1 daily mon through fri   No facility-administered encounter medications on file as of 03/08/2022.  Reviewed chart prior to disease state call.  Recent Office Vitals: BP Readings from Last 3 Encounters:  01/26/22 124/80  01/26/22 124/80  12/05/21 124/82   Pulse Readings from Last 3 Encounters:  01/26/22 88  01/26/22 88  12/05/21 67    Wt Readings from Last 3 Encounters:  01/26/22 174 lb (78.9 kg)  01/26/22 174 lb 9.6 oz (79.2 kg)  12/05/21 170 lb (77.1 kg)     Kidney Function Lab Results  Component Value Date/Time   CREATININE 0.91 01/26/2022 12:18 PM   CREATININE 0.81 11/21/2021 09:21 AM   GFRNONAA 68 05/20/2020 09:55 AM   GFRAA 79 05/20/2020 09:55 AM       Latest Ref Rng & Units 01/26/2022   12:18 PM 11/21/2021    9:21 AM 06/29/2021   12:46 PM  BMP  Glucose 70 - 99 mg/dL 77  573  88   BUN 8 - 27 mg/dL 17  16  15    Creatinine 0.57 - 1.00 mg/dL  2.20  2.54   BUN/Creat Ratio 12 - 28 19  20  19    Sodium 134 - 144 mmol/L 142  139  143   Potassium 3.5 - 5.2 mmol/L 3.9  3.4  3.9   Chloride 96 - 106 mmol/L 102  100  102   CO2 20 - 29 mmol/L 24  24  25    Calcium 8.7 - 10.3 mg/dL 9.8  2.70  9.7     : 1st attempt left VM 03-09-2022: 2nd attempt left VM 03-14-2022: 3rd attempt left VM  Care Gaps: Dexa scan overdue  Star Rating Drugs: Atorvastatin 20 mg- Last filled 02-28-2022 90 DS [facility] Lisinopril/ HCTZ 20- 12.5 mg- Last filled 02-28-2022 90 DS [facility]  Huey Romans CMA Clinical Pharmacist Assistant [phone]

## 2022-03-23 ENCOUNTER — Ambulatory Visit: Payer: Medicare Other | Admitting: Internal Medicine

## 2022-03-27 ENCOUNTER — Ambulatory Visit: Payer: Self-pay

## 2022-03-27 NOTE — Chronic Care Management (AMB) (Deleted)
Care Management    RN Visit Note  03/27/2022 Name: Katherine Summers MRN: [id number] DOB: [birth year]  Subjective: Katherine Summers is a 76 y.o. year old female who is a primary care patient of Katherine Peng, MD. The care management team was consulted for assistance with disease management and care coordination needs.    {CCMTELEPHONEFACETOFACE:21091510} for {CCMINITIALFOLLOWUPCHOICE:21091511} in response to provider referral for case management and/or care coordination services.   Consent to Services:   Katherine Summers was given information about Care Management services today including:  Care Management services includes personalized support from designated clinical staff supervised by her physician, including individualized plan of care and coordination with other care providers 24/7 contact phone numbers for assistance for urgent and routine care needs. The patient may stop case management services at any time by phone call to the office staff.  Patient agreed to services and consent obtained.   Assessment: Review of patient past medical history, allergies, medications, health status, including review of consultants reports, laboratory and other test data, was performed as part of comprehensive evaluation and provision of chronic care management services.   SDOH (Social Determinants of Health) assessments and interventions performed:    Care Plan  No Known Allergies  Outpatient Encounter Medications as of 03/27/2022  Medication Sig   albuterol (VENTOLIN HFA) 108 (90 Base) MCG/ACT inhaler Inhale 2 puffs into the lungs every 6 (six) hours as needed for wheezing or shortness of breath.   amLODipine (NORVASC) 5 MG tablet TAKE 1 TABLET(5 MG) BY MOUTH DAILY   atorvastatin (LIPITOR) 20 MG tablet TAKE 1 TABLET BY MOUTH EVERY DAY (Patient taking differently: Take 1 tab Monday, Wednesday and Friday.)   escitalopram (LEXAPRO) 10 MG tablet TAKE 1 TABLET(10 MG) BY MOUTH DAILY    Fluticasone-Umeclidin-Vilant (TRELEGY ELLIPTA) 100-62.5-25 MCG/ACT AEPB Inhale 1 puff into the lungs daily.   lisinopril-hydrochlorothiazide (ZESTORETIC) 20-12.5 MG tablet TAKE 1 TABLET BY MOUTH DAILY   VITAMIN D PO Take 5,000 mg by mouth. Take 1 daily mon through fri   No facility-administered encounter medications on file as of 03/27/2022.    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   Family history of malignant neoplasm of gastrointestinal tract 10/05/2020   Personal history of colonic polyps 10/05/2020   Impairment of balance 12/18/2019   Muscle weakness 12/18/2019   Urinary frequency 10/31/2018   Prediabetes 10/31/2018   OSA on CPAP 07/22/2018   CPAP use counseling 07/22/2018   Abnormal dreams 07/22/2018   Chronic obstructive pulmonary disease (HCC) 03/21/2018   Other fatigue 03/21/2018   Snorings 03/21/2018   Nocturia more than twice per night 03/21/2018   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   Pain in right knee 01/04/2018   Overweight (BMI 25.0-29.9) 11/01/2016   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   Murmur 08/01/2016   Shortness of breath 08/01/2016   Abnormal EKG 08/01/2016   Essential hypertension 08/01/2016   Depression 08/01/2016    Conditions to be addressed/monitored: {CCM ASSESSMENT DZ OPTIONS:25047}  Care Plan : RN Care Manager Plan of Care  Updates made by Katherine Churches, RN since 03/27/2022 12:00 AM     Problem: No plan of care established for management of chronic disease states (HTN, COPD, Prediabetes, Overactive bladder, Foot pain, bilateral)   Priority: High     Long-Range Goal: Establishment of  plan of care for management of chronic disease states (HTN, COPD, Prediabetes, Overactive bladder, Foot pain, bilateral)   Start Date: 10/20/2021  Expected End  Date: 10/20/2022  Recent Progress: On track  Priority: High  Note:   Current Barriers:  Knowledge Deficits related to plan of care for management of HTN, COPD, Prediabetes, Overactive bladder, Foot pain, bilateral   Chronic Disease Management support and education needs related to HTN, COPD, Prediabetes, Overactive bladder, Foot pain, bilateral    RNCM Clinical Goal(s):  Patient will verbalize basic understanding of  HTN, COPD, Prediabetes, Overactive bladder, Foot pain, bilateral  disease process and self health management plan as evidenced by patient will report having no disease exacerbation related to her chronic disease states as listed above  take all medications exactly as prescribed and will call provider for medication related questions as evidenced by will demonstrate improved understanding of prescribed medications and rationale for usage as evidenced by patient teach back demonstrate Improved health management independence as evidenced by patient will report 100% adherence to her prescribed treatment plan  continue to work with RN Care Manager to address care management and care coordination needs related to  HTN, COPD, Prediabetes, Overactive bladder, Foot pain, bilateral  as evidenced by adherence to CM Team Scheduled appointments demonstrate ongoing self health care management ability   as evidenced by    through collaboration with RN Care manager, provider, and care team.   Interventions: 1:1 collaboration with primary care provider regarding development and update of comprehensive plan of care as evidenced by provider attestation and co-signature Inter-disciplinary care team collaboration (see longitudinal plan of care) Evaluation of current treatment plan related to  self management and patient's adherence to plan as established by provider  COPD Interventions:  (Status:  Goal Met.) Long Term Goal Evaluation of current treatment plan related to COPD self management and patient's  adherence to plan as established by provider Determined patient completed her follow up visit with Pulmonologist with the following Assessment/Plan noted: Chronic obstructive pulmonary disease, unspecified COPD type ([place]) - Plan: albuterol (VENTOLIN HFA) 108 (90 Base) MCG/ACT inhaler, Fluticasone-Umeclidin-Vilant (TRELEGY ELLIPTA) 100-62.5-25 MCG/ACT AEPB Discussion: Aritha Huckeba is a 76 year old woman, former smoker with hypertension, hyperlipidemia and obstructive sleep apnea who returns to pulmonary clinic for COPD. She is to start trelegy ellipta 1 puff daily for COPD. She can use albuterol inhaler as needed. She has follow-up with Dr. Brett Fairy regarding her obstructive sleep apnea and resuming her use of the CPAP machine. Follow-up in 6 months. Educated patient on the basic disease process related to COPD Advised patient to track and manage COPD triggers Provided instruction about proper use of medications used for management of COPD including inhalers, including Trelegy and Albuterol; Mailed printed instructions Determined patient continues to have financial difficulty paying for Trelegy, sent embedded Pharm D referral to assist with cost or alternative treatments, patient is aware  Advised patient to engage in light exercise as tolerated 3-5 days a week to aid in the the management of COPD; Mailed printed instructions on how to perform chair exercises Discussed the importance of adequate rest and management of fatigue with COPD Discussed plans with patient for ongoing care management follow up and provided patient with direct contact information for care management team  Diabetes Interventions:  (Status:  Goal Met.) Long Term Goal Assessed patient's understanding of A1c goal: <6.5% Provided education to patient about basic DM disease process Reviewed medications with patient and discussed importance of medication adherence Review of patient  status, including review of consultants reports,  relevant laboratory and other test results, and medications completed Educated patient on dietary and exercise recommendations Lab Results  Component Value Date   HGBA1C 5.8 (H) 11/21/2021  Impaired Urinary Elimination:  (Status:  Goal Met.)  Long Term Goal Evaluation of current treatment plan related to  Impaired Urinary Elimination , self-management and patient's adherence to plan as established by provider Determined patient continues to have her overactive bladder treated by [facility] Reviewed medications with patient and discussed importance of medication adherence Educated patient on the importance to stay well hydrated with water, drinking 48-64 oz daily unless otherwise directed Encouraged patient to keep all scheduled MD appointments as directed Instructed patient to report her persistent frequency with her Urologist at next scheduled visit  Discussed plans with patient for ongoing care management follow up and provided patient with direct contact information for care management team   Hypertension Interventions:  (Status:  {CPGOALSTATUS:26465}) {SHORTTERMLONGTERM:26645} Last practice recorded BP readings:  BP Readings from Last 3 Encounters:  01/26/22 124/80  01/26/22 124/80  12/05/21 124/82  Most recent eGFR/CrCl:  Lab Results  Component Value Date   EGFR 66 01/26/2022    No components found for: "CRCL" Evaluation of current treatment plan related to hypertension self management and patient's adherence to plan as established by provider Counseled on the importance of exercise goals with target of 150 minutes per week Advised patient, providing education and rationale, to monitor blood pressure daily and record, calling PCP for findings outside established parameters Provided education on prescribed diet low Sodium   Pain Interventions:  (Status:  Goal Met.) Long Term Goal Reviewed provider established plan for pain management Educated patient on the benefits and  effectiveness of adherence to her HEP daily in order to help manage pain and improve endurance and strengthening  Pain assessment performed Medications reviewed Discussed importance of adherence to all scheduled medical appointments Counseled on the importance of reporting any/all new or changed pain symptoms or management strategies to pain management provider Advised patient to report to care team affect of pain on daily activities Reviewed with patient prescribed pharmacological and nonpharmacological pain relief strategies Assessed for SDOH barriers, patient would like assistance with resources to help with light housekeeping, she would like to know if her health plan provides an OTC benefit, SW referral sent   Patient Goals/Self-Care Activities: Take all medications as prescribed Attend all scheduled provider appointments Call pharmacy for medication refills 3-7 days in advance of running out of medications Perform all self care activities independently  Call provider office for new concerns or questions  drink 6 to 8 glasses of water each day manage portion size limit outdoor activity during cold weather do breathing exercises at least 2 times each day check blood pressure 3 times per week call doctor for signs and symptoms of high blood pressure keep all doctor appointments take medications for blood pressure exactly as prescribed report new symptoms to your doctor  Follow Up Plan: No further action required      Plan: {CM FOLLOW UP PLAN:25073}  SIG***

## 2022-03-27 NOTE — Progress Notes (Signed)
This encounter was created in error - please disregard.

## 2022-03-27 NOTE — Patient Outreach (Signed)
Care Coordination   Follow Up Visit Note   03/27/2022 Name: Katherine Summers MRN: [id number] DOB: March 19, 1946  Katherine Summers is a 76 y.o. year old female who sees Katherine Chard, MD for primary care. I spoke with  Katherine Summers by phone today  What matters to the patients health and wellness today?  To get some help with light house keeping.   COPD Interventions:  (Status:  Goal Met.) Long Term Goal Evaluation of current treatment plan related to COPD self management and patient's adherence to plan as established by provider Determined patient completed her follow up visit with Pulmonologist with the following Assessment/Plan noted: Chronic obstructive pulmonary disease, unspecified COPD type ([place]) - Plan: albuterol (VENTOLIN HFA) 108 (90 Base) MCG/ACT inhaler, Fluticasone-Umeclidin-Vilant (TRELEGY ELLIPTA) 100-62.5-25 MCG/ACT AEPB Discussion: Katherine Summers is a 76 year old woman, former smoker with hypertension, hyperlipidemia and obstructive sleep apnea who returns to pulmonary clinic for COPD. She is to start trelegy ellipta 1 puff daily for COPD. She can use albuterol inhaler as needed. She has follow-up with Dr. Brett Summers regarding her obstructive sleep apnea and resuming her use of the CPAP machine. Follow-up in 6 months. Educated patient on the basic disease process related to COPD Advised patient to track and manage COPD triggers Provided instruction about proper use of medications used for management of COPD including inhalers, including Trelegy and Albuterol; Mailed printed instructions Determined patient continues to have financial difficulty paying for Trelegy, sent embedded Pharm D referral to assist with cost or alternative treatments, patient is aware  Advised patient to engage in light exercise as tolerated 3-5 days a week to aid in the the management of COPD; Mailed printed instructions on how to perform chair exercises Discussed the importance of adequate rest and management  of fatigue with COPD Discussed plans with patient for ongoing care management follow up and provided patient with direct contact information for care management team  Diabetes Interventions:  (Status:  Goal Met.) Long Term Goal Assessed patient's understanding of A1c goal: <6.5% Provided education to patient about basic DM disease process Reviewed medications with patient and discussed importance of medication adherence Review of patient status, including review of consultants reports, relevant laboratory and other test results, and medications completed Educated patient on dietary and exercise recommendations Lab Results  Component Value Date   HGBA1C 5.8 (H) 11/21/2021  Impaired Urinary Elimination:  (Status:  Goal Met.)  Long Term Goal Evaluation of current treatment plan related to  Impaired Urinary Elimination , self-management and patient's adherence to plan as established by provider Determined patient continues to have her overactive bladder treated by [facility] Reviewed medications with patient and discussed importance of medication adherence Educated patient on the importance to stay well hydrated with water, drinking 48-64 oz daily unless otherwise directed Encouraged patient to keep all scheduled MD appointments as directed Instructed patient to report her persistent frequency with her Urologist at next scheduled visit  Discussed plans with patient for ongoing care management follow up and provided patient with direct contact information for care management team   Hypertension Interventions:  (Status:  Goal Met.) Long Term Goal Last practice recorded BP readings:  BP Readings from Last 3 Encounters:  01/26/22 124/80  01/26/22 124/80  12/05/21 124/82  Most recent eGFR/CrCl:  Lab Results  Component Value Date   EGFR 66 01/26/2022    No components found for: "CRCL" Evaluation of current treatment plan related to hypertension self management and patient's adherence to  plan as  established by provider Counseled on the importance of exercise goals with target of 150 minutes per week Advised patient, providing education and rationale, to monitor blood pressure daily and record, calling PCP for findings outside established parameters Provided education on prescribed diet low Sodium   Pain Interventions:  (Status:  Goal Met.) Long Term Goal Reviewed provider established plan for pain management Educated patient on the benefits and effectiveness of adherence to her HEP daily in order to help manage pain and improve endurance and strengthening  Pain assessment performed Medications reviewed Discussed importance of adherence to all scheduled medical appointments Counseled on the importance of reporting any/all new or changed pain symptoms or management strategies to pain management provider Advised patient to report to care team affect of pain on daily activities Reviewed with patient prescribed pharmacological and nonpharmacological pain relief strategies Assessed for SDOH barriers, patient would like assistance with resources to help with light housekeeping, she would like to know if her health plan provides an OTC benefit, SW referral sent    SDOH assessments and interventions completed:   Yes  Care Coordination Interventions Activated:  Yes Care Coordination Interventions:  Yes, provided  Follow up plan: Referral made to embedded BSW Katherine Summers   Encounter Outcome:  Pt. Visit Completed

## 2022-03-27 NOTE — Patient Instructions (Signed)
Visit Information  Thank you for taking time to visit with me today. Please don't hesitate to contact me if I can be of assistance to you.   Following are the goals we discussed today:  Take all medications as prescribed Attend all scheduled provider appointments Call pharmacy for medication refills 3-7 days in advance of running out of medications Perform all self care activities independently  Call provider office for new concerns or questions  drink 6 to 8 glasses of water each day manage portion size limit outdoor activity during cold weather do breathing exercises at least 2 times each day check blood pressure 3 times per week call doctor for signs and symptoms of high blood pressure keep all doctor appointments take medications for blood pressure exactly as prescribed report new symptoms to your doctor  If you are experiencing a Mental Health or Behavioral Health Crisis or need someone to talk to, please call 1-800-273-TALK (toll free, 24 hour hotline)  The patient verbalized understanding of instructions, educational materials, and care plan provided today and agreed to receive a mailed copy of patient instructions, educational materials, and care plan.   Delsa Sale, RN, BSN, CCM Care Management Coordinator [facility] Care Management Direct Phone: [phone]

## 2022-03-31 ENCOUNTER — Other Ambulatory Visit: Payer: Self-pay

## 2022-03-31 ENCOUNTER — Telehealth: Payer: Self-pay

## 2022-03-31 DIAGNOSIS — I10 Essential (primary) hypertension: Secondary | ICD-10-CM

## 2022-03-31 NOTE — Progress Notes (Signed)
This encounter was created in error - please disregard.

## 2022-03-31 NOTE — Telephone Encounter (Signed)
   Telephone encounter was:  Successful.  03/31/2022 Name: Katherine Summers MRN: [id number] DOB: 07/31/1946  Katherine Summers is a 76 y.o. year old female who is a primary care patient of Dorothyann Peng, MD . The community resource team was consulted for assistance with  Insurance question and personal care service  Care guide performed the following interventions: Patient provided with information about care guide support team and interviewed to confirm resource needs.  Follow Up Plan:  Care guide will outreach resources to assist patient with above needs  [facility] Guide, Embedded Care Coordination [facility]  [place], [place] [address]  Main Phone: [phone]  E-mail: [email or web address]  Website: [email or web address]

## 2022-04-05 ENCOUNTER — Telehealth: Payer: Self-pay

## 2022-04-05 NOTE — Telephone Encounter (Signed)
   Telephone encounter was:  Unsuccessful.  04/05/2022 Name: Katherine Summers MRN: [id number] DOB: 1946/05/30  Unsuccessful outbound call made today to assist with:   Personal/Home care  Outreach Attempt:  1st Attempt F/U  A HIPAA compliant voice message was left requesting a return call.  Instructed patient to call back at [phone] at their earliest convenience.  [facility] Guide, Embedded Care Coordination [facility]  [place], [place] [address]  Main Phone: [phone]  E-mail: [email or web address]  Website: [email or web address]

## 2022-04-12 NOTE — Progress Notes (Signed)
This encounter was created in error - please disregard.

## 2022-05-11 ENCOUNTER — Ambulatory Visit: Payer: Medicare Other | Admitting: Pulmonary Disease

## 2022-05-25 ENCOUNTER — Ambulatory Visit (INDEPENDENT_AMBULATORY_CARE_PROVIDER_SITE_OTHER): Payer: Medicare Other | Admitting: Internal Medicine

## 2022-05-25 ENCOUNTER — Encounter: Payer: Self-pay | Admitting: Internal Medicine

## 2022-05-25 VITALS — BP 134/66 | HR 84 | Temp 98.8°F | Ht 64.6 in | Wt 178.4 lb

## 2022-05-25 DIAGNOSIS — E7800 Pure hypercholesterolemia, unspecified: Secondary | ICD-10-CM | POA: Diagnosis not present

## 2022-05-25 DIAGNOSIS — R252 Cramp and spasm: Secondary | ICD-10-CM | POA: Diagnosis not present

## 2022-05-25 DIAGNOSIS — I10 Essential (primary) hypertension: Secondary | ICD-10-CM | POA: Diagnosis not present

## 2022-05-25 DIAGNOSIS — R300 Dysuria: Secondary | ICD-10-CM

## 2022-05-25 DIAGNOSIS — F3341 Major depressive disorder, recurrent, in partial remission: Secondary | ICD-10-CM

## 2022-05-25 DIAGNOSIS — E2839 Other primary ovarian failure: Secondary | ICD-10-CM

## 2022-05-25 DIAGNOSIS — Z2821 Immunization not carried out because of patient refusal: Secondary | ICD-10-CM

## 2022-05-25 DIAGNOSIS — Z23 Encounter for immunization: Secondary | ICD-10-CM

## 2022-05-25 NOTE — Progress Notes (Signed)
Jeri Cos Llittleton,acting as a Neurosurgeon for Gwynneth Aliment, MD.,have documented all relevant documentation on the behalf of Gwynneth Aliment, MD,as directed by  Gwynneth Aliment, MD while in the presence of Gwynneth Aliment, MD.    Subjective:     Patient ID: Katherine Summers , female    DOB: 12/28/1945 , 76 y.o.   MRN: [id number]   Chief Complaint  Patient presents with   Hypertension   Hyperlipidemia    HPI  She is here today for a bp and chol check. Patient reports she is having leg cramps after taking the atorvastatin. She has no other concerns at this time.    Hypertension This is a chronic problem. The current episode started more than 1 year ago. The problem has been gradually improving since onset. The problem is controlled. Pertinent negatives include no blurred vision. The current treatment provides moderate improvement. Compliance problems include exercise.   Hyperlipidemia Associated symptoms include myalgias.     Past Medical History:  Diagnosis Date   Allergy    CKD (chronic kidney disease) stage 2, GFR 60-89 ml/min    COPD (chronic obstructive pulmonary disease) (HCC)    Depression 08/01/2016   Dizziness and giddiness    Essential hypertension 08/01/2016   Hyperlipidemia    Hypertension    Murmur 08/01/2016   Pre-diabetes    Shortness of breath 08/01/2016   Vitamin D deficiency      Family History  Problem Relation Age of Onset   Stroke Mother    Colon cancer Father    Colon cancer Sister    Stroke Maternal Grandmother    Colon cancer Paternal Grandfather      Current Outpatient Medications:    albuterol (VENTOLIN HFA) 108 (90 Base) MCG/ACT inhaler, Inhale 2 puffs into the lungs every 6 (six) hours as needed for wheezing or shortness of breath., Disp: 8 g, Rfl: 6   amLODipine (NORVASC) 5 MG tablet, TAKE 1 TABLET(5 MG) BY MOUTH DAILY, Disp: 90 tablet, Rfl: 2   atorvastatin (LIPITOR) 20 MG tablet, TAKE 1 TABLET BY MOUTH EVERY DAY (Patient taking  differently: Take 1 tab Monday, Wednesday and Friday.), Disp: 90 tablet, Rfl: 2   escitalopram (LEXAPRO) 10 MG tablet, TAKE 1 TABLET(10 MG) BY MOUTH DAILY, Disp: 90 tablet, Rfl: 1   Fluticasone-Umeclidin-Vilant (TRELEGY ELLIPTA) 100-62.5-25 MCG/ACT AEPB, Inhale 1 puff into the lungs daily., Disp: 28 each, Rfl: 11   lisinopril-hydrochlorothiazide (ZESTORETIC) 20-12.5 MG tablet, TAKE 1 TABLET BY MOUTH DAILY, Disp: 90 tablet, Rfl: 2   VITAMIN D PO, Take 5,000 mg by mouth. Take 1 daily mon through fri, Disp: , Rfl:    No Known Allergies   Review of Systems  Constitutional: Negative.   Eyes: Negative.  Negative for blurred vision.  Respiratory: Negative.    Cardiovascular: Negative.   Musculoskeletal:  Positive for myalgias.  Skin: Negative.   Neurological: Negative.   Psychiatric/Behavioral: Negative.         She states she is ready to come off of her depression medication. She reports her mood is fine.   All other systems reviewed and are negative.    Today's Vitals   05/25/22 0952  BP: 134/66  Pulse: 84  Temp: 98.8 F (37.1 C)  Weight: 178 lb 6.4 oz (80.9 kg)  Height: 5' 4.6" (1.641 m)  PainSc: 0-No pain   Body mass index is 30.06 kg/m.  Wt Readings from Last 3 Encounters:  05/25/22 178 lb 6.4 oz (80.9 kg)  01/26/22 174 lb (78.9 kg)  01/26/22 174 lb 9.6 oz (79.2 kg)     Objective:  Physical Exam Vitals and nursing note reviewed.  Constitutional:      Appearance: Normal appearance.  HENT:     Head: Normocephalic and atraumatic.  Eyes:     Extraocular Movements: Extraocular movements intact.  Cardiovascular:     Rate and Rhythm: Normal rate and regular rhythm.     Heart sounds: Normal heart sounds.  Pulmonary:     Effort: Pulmonary effort is normal.     Breath sounds: Normal breath sounds.  Musculoskeletal:     Cervical back: Normal range of motion.     Comments: Ambulatory with cane  Skin:    General: Skin is warm.  Neurological:     General: No focal deficit  present.     Mental Status: She is alert.  Psychiatric:        Mood and Affect: Mood normal.        Behavior: Behavior normal.       Assessment And Plan:     1. Pure hypercholesterolemia Comments: Chronic, she is on atorvastatin 20mg  MWF. I will check lipid panel today.  - CMP14+EGFR - Lipid panel  2. Essential hypertension Comments: Chronic, fair control. Goal BP<130/80. She will c/w amlodipine 5mg  and lisinopril/hctz 20/12.5mg  daily.  - CMP14+EGFR  3. Recurrent major depressive disorder, in partial remission ([place]) Comments: Decrease escitalopram to 1/2 tab daily. F/u six weeks  4. Muscle cramps Comments: S'he is encouraged to stay well hydrated. She may also benefit from Mg supplementation.  - CK, total  5. Decreased estrogen level Comments: She agrees to referral for bone density. [facility] - DG Bone Density; Future  6. Dysuria Comments: I will check u/a to r/o UTI. 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  7. Immunization due Comments: She will receive second Shingrix IM x 1. This will be billed via [employer].  - Varicella-zoster vaccine IM   Patient was given opportunity to ask questions. Patient verbalized understanding of the plan and was able to repeat key elements of the plan. All questions were answered to their satisfaction.   I, Maximino Greenland, MD, have reviewed all documentation for this visit. The documentation on 05/25/22 for the exam, diagnosis, procedures, and orders are all accurate and complete.   IF YOU HAVE BEEN REFERRED TO A SPECIALIST, IT MAY TAKE 1-2 WEEKS TO SCHEDULE/PROCESS THE REFERRAL. IF YOU HAVE NOT HEARD FROM US/SPECIALIST IN TWO WEEKS, PLEASE GIVE [place] A CALL AT [phone].   THE PATIENT IS ENCOURAGED TO PRACTICE SOCIAL DISTANCING DUE TO THE COVID-19 PANDEMIC.

## 2022-05-25 NOTE — Patient Instructions (Signed)
Hypertension, Adult ?Hypertension is another name for high blood pressure. High blood pressure forces your heart to work harder to pump blood. This can cause problems over time. ?There are two numbers in a blood pressure reading. There is a top number (systolic) over a bottom number (diastolic). It is best to have a blood pressure that is below 120/80. ?What are the causes? ?The cause of this condition is not known. Some other conditions can lead to high blood pressure. ?What increases the risk? ?Some lifestyle factors can make you more likely to develop high blood pressure: ?Smoking. ?Not getting enough exercise or physical activity. ?Being overweight. ?Having too much fat, sugar, calories, or salt (sodium) in your diet. ?Drinking too much alcohol. ?Other risk factors include: ?Having any of these conditions: ?Heart disease. ?Diabetes. ?High cholesterol. ?Kidney disease. ?Obstructive sleep apnea. ?Having a family history of high blood pressure and high cholesterol. ?Age. The risk increases with age. ?Stress. ?What are the signs or symptoms? ?High blood pressure may not cause symptoms. Very high blood pressure (hypertensive crisis) may cause: ?Headache. ?Fast or uneven heartbeats (palpitations). ?Shortness of breath. ?Nosebleed. ?Vomiting or feeling like you may vomit (nauseous). ?Changes in how you see. ?Very bad chest pain. ?Feeling dizzy. ?Seizures. ?How is this treated? ?This condition is treated by making healthy lifestyle changes, such as: ?Eating healthy foods. ?Exercising more. ?Drinking less alcohol. ?Your doctor may prescribe medicine if lifestyle changes do not help enough and if: ?Your top number is above 130. ?Your bottom number is above 80. ?Your personal target blood pressure may vary. ?Follow these instructions at home: ?Eating and drinking ? ?If told, follow the DASH eating plan. To follow this plan: ?Fill one half of your plate at each meal with fruits and vegetables. ?Fill one fourth of your plate  at each meal with whole grains. Whole grains include whole-wheat pasta, brown rice, and whole-grain bread. ?Eat or drink low-fat dairy products, such as skim milk or low-fat yogurt. ?Fill one fourth of your plate at each meal with low-fat (lean) proteins. Low-fat proteins include fish, chicken without skin, eggs, beans, and tofu. ?Avoid fatty meat, cured and processed meat, or chicken with skin. ?Avoid pre-made or processed food. ?Limit the amount of salt in your diet to less than 1,500 mg each day. ?Do not drink alcohol if: ?Your doctor tells you not to drink. ?You are pregnant, may be pregnant, or are planning to become pregnant. ?If you drink alcohol: ?Limit how much you have to: ?0-1 drink a day for women. ?0-2 drinks a day for men. ?Know how much alcohol is in your drink. In the [place] one drink equals one 12 oz bottle of beer (355 mL), one 5 oz glass of wine (148 mL), or one 1? oz glass of hard liquor (44 mL). ?Lifestyle ? ?Work with your doctor to stay at a healthy weight or to lose weight. Ask your doctor what the best weight is for you. ?Get at least 30 minutes of exercise that causes your heart to beat faster (aerobic exercise) most days of the week. This may include walking, swimming, or biking. ?Get at least 30 minutes of exercise that strengthens your muscles (resistance exercise) at least 3 days a week. This may include lifting weights or doing Pilates. ?Do not smoke or use any products that contain nicotine or tobacco. If you need help quitting, ask your doctor. ?Check your blood pressure at home as told by your doctor. ?Keep all follow-up visits. ?Medicines ?Take over-the-counter and prescription medicines   only as told by your doctor. Follow directions carefully. ?Do not skip doses of blood pressure medicine. The medicine does not work as well if you skip doses. Skipping doses also puts you at risk for problems. ?Ask your doctor about side effects or reactions to medicines that you should watch  for. ?Contact a doctor if: ?You think you are having a reaction to the medicine you are taking. ?You have headaches that keep coming back. ?You feel dizzy. ?You have swelling in your ankles. ?You have trouble with your vision. ?Get help right away if: ?You get a very bad headache. ?You start to feel mixed up (confused). ?You feel weak or numb. ?You feel faint. ?You have very bad pain in your: ?Chest. ?Belly (abdomen). ?You vomit more than once. ?You have trouble breathing. ?These symptoms may be an emergency. Get help right away. Call 911. ?Do not wait to see if the symptoms will go away. ?Do not drive yourself to the hospital. ?Summary ?Hypertension is another name for high blood pressure. ?High blood pressure forces your heart to work harder to pump blood. ?For most people, a normal blood pressure is less than 120/80. ?Making healthy choices can help lower blood pressure. If your blood pressure does not get lower with healthy choices, you may need to take medicine. ?This information is not intended to replace advice given to you by your health care provider. Make sure you discuss any questions you have with your health care provider. ?Document Revised: 06/09/2021 Document Reviewed: 06/09/2021 ?Elsevier Patient Education ? 2023 [employer] ? ?

## 2022-05-26 LAB — CMP14+EGFR
ALT: 12 IU/L (ref 0–32)
AST: 18 IU/L (ref 0–40)
Albumin/Globulin Ratio: 1.6 (ref 1.2–2.2)
Albumin: 4.4 g/dL (ref 3.8–4.8)
Alkaline Phosphatase: 113 IU/L (ref 44–121)
BUN/Creatinine Ratio: 18 (ref 12–28)
BUN: 16 mg/dL (ref 8–27)
Bilirubin Total: 0.4 mg/dL (ref 0.0–1.2)
CO2: 23 mmol/L (ref 20–29)
Calcium: 10.2 mg/dL (ref 8.7–10.3)
Chloride: 104 mmol/L (ref 96–106)
Creatinine, Ser: 0.87 mg/dL (ref 0.57–1.00)
Globulin, Total: 2.7 g/dL (ref 1.5–4.5)
Glucose: 102 mg/dL — ABNORMAL HIGH (ref 70–99)
Potassium: 3.6 mmol/L (ref 3.5–5.2)
Sodium: 144 mmol/L (ref 134–144)
Total Protein: 7.1 g/dL (ref 6.0–8.5)
eGFR: 69 mL/min/{1.73_m2} (ref >59)

## 2022-05-26 LAB — CK: Total CK: 96 U/L (ref 32–182)

## 2022-05-26 LAB — LIPID PANEL
Chol/HDL Ratio: 3 ratio (ref 0.0–4.4)
Cholesterol, Total: 170 mg/dL (ref 100–199)
HDL: 57 mg/dL (ref >39)
LDL Chol Calc (NIH): 97 mg/dL (ref 0–99)
Triglycerides: 85 mg/dL (ref 0–149)
VLDL Cholesterol Cal: 16 mg/dL (ref 5–40)

## 2022-05-29 ENCOUNTER — Ambulatory Visit: Payer: Self-pay

## 2022-05-29 ENCOUNTER — Other Ambulatory Visit: Payer: Self-pay | Admitting: Internal Medicine

## 2022-05-29 NOTE — Patient Outreach (Signed)
  Care Coordination   05/29/2022 Name: Katherine Summers MRN: [id number] DOB: November 19, 1945   Care Coordination Outreach Attempts:  An unsuccessful telephone outreach was attempted for a scheduled appointment today.  Follow Up Plan:  Additional outreach attempts will be made to offer the patient care coordination information and services.   Encounter Outcome:  Pt. Request to Call Back  Care Coordination Interventions Activated:  No   Care Coordination Interventions:  No, not indicated    Barb Merino, RN, BSN, CCM Care Management Coordinator [facility]  Direct Phone: [phone]

## 2022-05-30 ENCOUNTER — Other Ambulatory Visit (HOSPITAL_COMMUNITY)
Admission: RE | Admit: 2022-05-30 | Discharge: 2022-05-30 | Disposition: A | Discharge status: Home / Self Care | Payer: Medicare Other | Source: Ambulatory Visit | Attending: Internal Medicine | Admitting: Internal Medicine

## 2022-05-30 ENCOUNTER — Other Ambulatory Visit: Payer: Self-pay

## 2022-05-30 DIAGNOSIS — I10 Essential (primary) hypertension: Secondary | ICD-10-CM

## 2022-05-30 DIAGNOSIS — R8290 Unspecified abnormal findings in urine: Secondary | ICD-10-CM | POA: Diagnosis present

## 2022-05-31 LAB — MICROALBUMIN / CREATININE URINE RATIO
Creatinine, Urine: 79.2 mg/dL
Microalb/Creat Ratio: 14 mg/g creat (ref 0–29)
Microalbumin, Urine: 11.4 ug/mL

## 2022-06-01 LAB — URINE CYTOLOGY ANCILLARY ONLY
Bacterial Vaginitis-Urine: NEGATIVE
Candida Urine: NEGATIVE

## 2022-06-28 ENCOUNTER — Ambulatory Visit: Payer: Medicare Other | Admitting: Pulmonary Disease

## 2022-06-29 ENCOUNTER — Ambulatory Visit: Payer: Self-pay

## 2022-06-29 NOTE — Patient Outreach (Signed)
  Care Coordination   06/29/2022 Name: Katherine Summers MRN: [id number] DOB: [birth year]   Care Coordination Outreach Attempts:  An unsuccessful telephone outreach was attempted for a scheduled appointment today.  Follow Up Plan:  Additional outreach attempts will be made to offer the patient care coordination information and services.   Encounter Outcome:  No Answer  Care Coordination Interventions Activated:  No   Care Coordination Interventions:  No, not indicated    Barb Merino, RN, BSN, CCM Care Management Coordinator [facility] Direct Phone: [phone]

## 2022-07-10 ENCOUNTER — Ambulatory Visit (INDEPENDENT_AMBULATORY_CARE_PROVIDER_SITE_OTHER): Payer: Medicare Other | Admitting: Internal Medicine

## 2022-07-10 ENCOUNTER — Encounter: Payer: Self-pay | Admitting: Internal Medicine

## 2022-07-10 VITALS — BP 130/62 | HR 79 | Temp 98.1°F | Ht 64.0 in | Wt 172.4 lb

## 2022-07-10 DIAGNOSIS — E7800 Pure hypercholesterolemia, unspecified: Secondary | ICD-10-CM

## 2022-07-10 DIAGNOSIS — I10 Essential (primary) hypertension: Secondary | ICD-10-CM | POA: Diagnosis not present

## 2022-07-10 DIAGNOSIS — F3341 Major depressive disorder, recurrent, in partial remission: Secondary | ICD-10-CM | POA: Diagnosis not present

## 2022-07-10 DIAGNOSIS — R7309 Other abnormal glucose: Secondary | ICD-10-CM

## 2022-07-10 DIAGNOSIS — Z6829 Body mass index (BMI) 29.0-29.9, adult: Secondary | ICD-10-CM

## 2022-07-10 DIAGNOSIS — Z789 Other specified health status: Secondary | ICD-10-CM

## 2022-07-10 DIAGNOSIS — E663 Overweight: Secondary | ICD-10-CM

## 2022-07-10 NOTE — Progress Notes (Signed)
Hershal Coria Martin,acting as a Neurosurgeon for Gwynneth Aliment, MD.,have documented all relevant documentation on the behalf of Gwynneth Aliment, MD,as directed by  Gwynneth Aliment, MD while in the presence of Gwynneth Aliment, MD.    Subjective:     Patient ID: Katherine Summers , female    DOB: Apr 26, 1946 , 76 y.o.   MRN: [id number]   Chief Complaint  Patient presents with   Depression    HPI  Patient presents today for a depression follow up, patient reports compliance with medications. Patient states she wants to stop the depression medicine because she doesn't feel depressed anymore. At her last visit, we discussed  her decreasing the dose; however, she did not do so. She is not having any issues with the medication. Patient also has concerns about her Atorvastatin, she has muscle cramps that are getting worse.   BP Readings from Last 3 Encounters: 07/10/22 : 130/62 05/25/22 : 134/66 01/26/22 : 124/80    Depression        This is a chronic problem.  The current episode started more than 1 year ago.   The onset quality is gradual.   Associated symptoms include irritable, body aches, myalgias and indigestion.  Associated symptoms include no suicidal ideas.  Past treatments include SSRIs - Selective serotonin reuptake inhibitors.  Compliance with treatment is good.  Previous treatment provided moderate relief.    Past Medical History:  Diagnosis Date   Allergy    CKD (chronic kidney disease) stage 2, GFR 60-89 ml/min    COPD (chronic obstructive pulmonary disease) (HCC)    Depression 08/01/2016   Dizziness and giddiness    Essential hypertension 08/01/2016   Hyperlipidemia    Hypertension    Murmur 08/01/2016   Pre-diabetes    Shortness of breath 08/01/2016   Vitamin D deficiency      Family History  Problem Relation Age of Onset   Stroke Mother    Colon cancer Father    Colon cancer Sister    Stroke Maternal Grandmother    Colon cancer Paternal Grandfather      Current  Outpatient Medications:    albuterol (VENTOLIN HFA) 108 (90 Base) MCG/ACT inhaler, Inhale 2 puffs into the lungs every 6 (six) hours as needed for wheezing or shortness of breath., Disp: 8 g, Rfl: 6   amLODipine (NORVASC) 5 MG tablet, TAKE 1 TABLET(5 MG) BY MOUTH DAILY, Disp: 90 tablet, Rfl: 2   atorvastatin (LIPITOR) 20 MG tablet, Take 1 tab Monday, Wednesday and Friday., Disp: 90 tablet, Rfl: 2   escitalopram (LEXAPRO) 10 MG tablet, TAKE 1 TABLET(10 MG) BY MOUTH DAILY, Disp: 90 tablet, Rfl: 1   Fluticasone-Umeclidin-Vilant (TRELEGY ELLIPTA) 100-62.5-25 MCG/ACT AEPB, Inhale 1 puff into the lungs daily., Disp: 28 each, Rfl: 11   lisinopril-hydrochlorothiazide (ZESTORETIC) 20-12.5 MG tablet, TAKE 1 TABLET BY MOUTH DAILY, Disp: 90 tablet, Rfl: 2   VITAMIN D PO, Take 5,000 mg by mouth. Take 1 daily mon through fri, Disp: , Rfl:    No Known Allergies   Review of Systems  Constitutional: Negative.   HENT: Negative.    Eyes: Negative.   Respiratory: Negative.    Cardiovascular: Negative.   Gastrointestinal: Negative.   Musculoskeletal:  Positive for myalgias.  Psychiatric/Behavioral:  Positive for depression. Negative for suicidal ideas.      Today's Vitals   07/10/22 1616  BP: 130/62  Pulse: 79  Temp: 98.1 F (36.7 C)  TempSrc: Oral  Weight: 172 lb 6.4  oz (78.2 kg)  Height: 5\' 4"  (1.626 m)  PainSc: 6   PainLoc: Leg   Body mass index is 29.59 kg/m.  Wt Readings from Last 3 Encounters:  07/10/22 172 lb 6.4 oz (78.2 kg)  05/25/22 178 lb 6.4 oz (80.9 kg)  01/26/22 174 lb (78.9 kg)    Objective:  Physical Exam Vitals and nursing note reviewed.  Constitutional:      General: She is irritable.     Appearance: Normal appearance.  HENT:     Head: Normocephalic and atraumatic.     Nose:     Comments: Masked     Mouth/Throat:     Comments: Masked  Eyes:     Extraocular Movements: Extraocular movements intact.  Cardiovascular:     Rate and Rhythm: Normal rate and regular  rhythm.     Heart sounds: Normal heart sounds.  Pulmonary:     Effort: Pulmonary effort is normal.     Breath sounds: Normal breath sounds.  Musculoskeletal:     Cervical back: Normal range of motion.  Skin:    General: Skin is warm.  Neurological:     General: No focal deficit present.     Mental Status: She is alert.  Psychiatric:        Mood and Affect: Mood normal.        Behavior: Behavior normal.       Assessment And Plan:     1. Recurrent major depressive disorder, in partial remission ([place]) Comments: Chronic, she has now decided to continue with current dosage, no longer wants to decrease the medication. She will f/u 4-6 months. - TSH  2. Essential hypertension Comments: Chronic, fair control. Optimal goal <120/80. I will not make any changes to her medication regimen. She is encouraged to decrease her intake of processed meats. - Amb Referral To Provider Referral Exercise Program (P.R.E.P)  3. Pure hypercholesterolemia Comments: Chronic, she is currently on atorvastatin 20mg  MWF. This has caused her leg pain, wants to switch to a different medication. - [facility]B [facility] to [facility] - Amb Referral To Provider Referral Exercise Program (P.R.E.P)  4. Statin intolerance - AMB Referral to [facility]  5. Other abnormal glucose Comments: Her a1c has been elevated in the past. I will recheck this today. She is encouraged to decrease her intake of sugary beverages and foods. - Hemoglobin A1c  6. Overweight with body mass index (BMI) of 29 to 29.9 in adult Comments: She is encouraged to incorporate more activity into her daily routine. She reluctantly agrees to referral to the PREP program.   Patient was given opportunity to ask questions. Patient verbalized understanding of the plan and was able to repeat key elements of the plan. All questions were answered to their satisfaction.   I, Maximino Greenland, MD, have reviewed all  documentation for this visit. The documentation on 07/10/22 for the exam, diagnosis, procedures, and orders are all accurate and complete.   IF YOU HAVE BEEN REFERRED TO A SPECIALIST, IT MAY TAKE 1-2 WEEKS TO SCHEDULE/PROCESS THE REFERRAL. IF YOU HAVE NOT HEARD FROM US/SPECIALIST IN TWO WEEKS, PLEASE GIVE [place] A CALL AT [phone].   THE PATIENT IS ENCOURAGED TO PRACTICE SOCIAL DISTANCING DUE TO THE COVID-19 PANDEMIC.

## 2022-07-10 NOTE — Patient Instructions (Signed)

## 2022-07-11 LAB — HEMOGLOBIN A1C
Est. average glucose Bld gHb Est-mCnc: 123 mg/dL
Hgb A1c MFr Bld: 5.9 % — ABNORMAL HIGH (ref 4.8–5.6)

## 2022-07-11 LAB — TSH: TSH: 1.4 u[IU]/mL (ref 0.450–4.500)

## 2022-07-14 ENCOUNTER — Telehealth: Payer: Self-pay

## 2022-07-14 NOTE — Telephone Encounter (Signed)
Call to pt reference PREP referral Talked about program.  Offered [facility] 1030a class starting in January.  Will consider starting then. Will call her in January to see if that schedule works for her.

## 2022-07-19 ENCOUNTER — Encounter: Payer: Self-pay | Admitting: Pulmonary Disease

## 2022-07-19 ENCOUNTER — Ambulatory Visit (INDEPENDENT_AMBULATORY_CARE_PROVIDER_SITE_OTHER): Payer: Medicare Other | Admitting: Pulmonary Disease

## 2022-07-19 VITALS — BP 136/64 | HR 78 | Ht 64.0 in | Wt 175.0 lb

## 2022-07-19 DIAGNOSIS — J449 Chronic obstructive pulmonary disease, unspecified: Secondary | ICD-10-CM | POA: Diagnosis not present

## 2022-07-19 MED ORDER — IPRATROPIUM-ALBUTEROL 0.5-2.5 (3) MG/3ML IN SOLN: 3.0000 mL | Freq: Four times a day (QID) | RESPIRATORY_TRACT | 6 refills | Status: AC | PRN

## 2022-07-19 NOTE — Progress Notes (Signed)
Synopsis: Referred in August 2022 for COPD by Dorothyann Pengobyn Sanders, MD  Subjective:   PATIENT ID: Katherine Summers GENDER: female DOB: 10-09-1945, MRN: [id number]  HPI  Chief Complaint  Patient presents with   Follow-up    6 mo f/u. States she has been unable to use the Trelegy or albuterol inhalers. Increased wheezing and fatigue with exertion.    Katherine Summers is a 76 year old woman, former smoker with hypertension, hyperlipidemia and obstructive sleep apnea who returns to pulmonary clinic for COPD.  She complains of wheezing and fatigue with exertion. She has not been able to use the trelegy inhaler due to dyspnea. She denies increased sputum production, fevers, or chills. She does not have a nebulizer machine at home.  OV 12/05/21 She was trialed on anoro ellipta and did not notice much difference after trying it.   She has not been using CPAP in months due to issues with tolerating her current mask.   She has dyspnea when walking from room to room in her home.   OV 04/13/21 She reports having shortness of breath for the last 10 years.  She reports being diagnosed with COPD around 10 to 12 years ago but does not recall having pulmonary function testing done.  She was trialed on Spiriva inhaler therapy and did not report any benefit.  She has history of obstructive sleep apnea and has a CPAP machine at home that she is not currently using.  She reports issues with the nasal mask fitting.  She is followed by Dr. Vickey [facility]ier [facility].  Does have shortness of breath at rest and with activity and a nonproductive cough.  She also complains of nasal congestion.  Is a former smoker and quit in the 1980s.  She was more of a social smoker.  She does report significant history of secondhand smoke as her parents smoked around her when she was growing up.  Past Medical History:  Diagnosis Date   Allergy    CKD (chronic kidney disease) stage 2, GFR 60-89 ml/min    COPD  (chronic obstructive pulmonary disease) (HCC)    Depression 08/01/2016   Dizziness and giddiness    Essential hypertension 08/01/2016   Hyperlipidemia    Hypertension    Murmur 08/01/2016   Pre-diabetes    Shortness of breath 08/01/2016   Vitamin D deficiency      Family History  Problem Relation Age of Onset   Stroke Mother    Colon cancer Father    Colon cancer Sister    Stroke Maternal Grandmother    Colon cancer Paternal Grandfather      Social History   Socioeconomic History   Marital status: Single    Spouse name: Not on file   Number of children: Not on file   Years of education: Not on file   Highest education level: Not on file  Occupational History   Occupation: retired  Tobacco Use   Smoking status: Former    Packs/day: 0.25    Years: 12.00    Total pack years: 3.00    Types: Cigarettes    Quit date: 1998    Years since quitting: 25.8   Smokeless tobacco: Never  Vaping Use   Vaping Use: Never used  Substance and Sexual Activity   Alcohol use: Not Currently    Comment: corona ever now and then   Drug use: No   Sexual activity: Not Currently  Other Topics Concern   Not on file  Social History Narrative   Not on file   Social Determinants of Health   Financial Resource Strain: Low Risk  (01/26/2022)   Overall Financial Resource Strain (CARDIA)    Difficulty of Paying Living Expenses: Not hard at all  Food Insecurity: No Food Insecurity (01/26/2022)   Hunger Vital Sign    Worried About Running Out of Food in the Last Year: Never true    Ran Out of Food in the Last Year: Never true  Transportation Needs: No Transportation Needs (01/26/2022)   PRAPARE - Administrator, Civil Service (Medical): No    Lack of Transportation (Non-Medical): No  Physical Activity: Inactive (01/26/2022)   Exercise Vital Sign    Days of Exercise per Week: 0 days    Minutes of Exercise per Session: 0 min  Stress: No Stress Concern Present (01/26/2022)   [employer] of Occupational [employer] - Occupational Stress Questionnaire    Feeling of Stress : Not at all  Social Connections: Not on file  Intimate Partner Violence: Not At Risk (10/31/2018)   Humiliation, Afraid, Rape, and Kick questionnaire    Fear of Current or Ex-Partner: No    Emotionally Abused: No    Physically Abused: No    Sexually Abused: No     No Known Allergies   Outpatient Medications Prior to Visit  Medication Sig Dispense Refill   albuterol (VENTOLIN HFA) 108 (90 Base) MCG/ACT inhaler Inhale 2 puffs into the lungs every 6 (six) hours as needed for wheezing or shortness of breath. 8 g 6   amLODipine (NORVASC) 5 MG tablet TAKE 1 TABLET(5 MG) BY MOUTH DAILY 90 tablet 2   atorvastatin (LIPITOR) 20 MG tablet Take 1 tab Monday, Wednesday and Friday. 90 tablet 2   escitalopram (LEXAPRO) 10 MG tablet TAKE 1 TABLET(10 MG) BY MOUTH DAILY 90 tablet 1   Fluticasone-Umeclidin-Vilant (TRELEGY ELLIPTA) 100-62.5-25 MCG/ACT AEPB Inhale 1 puff into the lungs daily. 28 each 11   lisinopril-hydrochlorothiazide (ZESTORETIC) 20-12.5 MG tablet TAKE 1 TABLET BY MOUTH DAILY 90 tablet 2   VITAMIN D PO Take 5,000 mg by mouth. Take 1 daily mon through fri     No facility-administered medications prior to visit.    Review of Systems  Constitutional:  Positive for malaise/fatigue. Negative for chills, fever and weight loss.  HENT:  Negative for sinus pain and sore throat.   Eyes: Negative.   Respiratory:  Positive for shortness of breath and wheezing. Negative for cough, hemoptysis and sputum production.   Cardiovascular:  Negative for chest pain, palpitations, orthopnea, claudication and leg swelling.  Gastrointestinal:  Negative for abdominal pain, heartburn, nausea and vomiting.  Genitourinary: Negative.   Musculoskeletal:  Negative for joint pain and myalgias.  Skin:  Negative for rash.  Neurological:  Negative for weakness.  Endo/Heme/Allergies: Negative.   Psychiatric/Behavioral:  Negative  for depression.     Objective:   Vitals:   07/19/22 0933  BP: 136/64  Pulse: 78  SpO2: 99%  Weight: 175 lb (79.4 kg)  Height: 5\' 4"  (1.626 m)    Physical Exam Constitutional:      General: She is not in acute distress.    Appearance: She is not ill-appearing.  HENT:     Head: Normocephalic and atraumatic.  Eyes:     General: No scleral icterus.    Conjunctiva/sclera: Conjunctivae normal.  Cardiovascular:     Rate and Rhythm: Normal rate and regular rhythm.     Pulses: Normal pulses.  Heart sounds: Normal heart sounds. No murmur heard. Pulmonary:     Effort: Pulmonary effort is normal.     Breath sounds: Decreased air movement present. No wheezing, rhonchi or rales.  Musculoskeletal:     Right lower leg: No edema.     Left lower leg: No edema.  Skin:    General: Skin is warm and dry.  Neurological:     General: No focal deficit present.     Mental Status: She is alert.  Psychiatric:        Mood and Affect: Mood normal.        Behavior: Behavior normal.        Thought Content: Thought content normal.        Judgment: Judgment normal.     CBC    Component Value Date/Time   WBC 9.3 11/21/2021 0921   WBC 8.2 02/24/2018 1512   RBC 4.93 11/21/2021 0921   RBC 4.67 02/24/2018 1512   HGB 14.4 11/21/2021 0921   HCT 41.8 11/21/2021 0921   PLT 290 11/21/2021 0921   MCV 85 11/21/2021 0921   MCH 29.2 11/21/2021 0921   MCH 29.3 02/24/2018 1512   MCHC 34.4 11/21/2021 0921   MCHC 34.4 02/24/2018 1512   RDW 12.3 11/21/2021 0921   LYMPHSABS 1.8 02/24/2018 1512   MONOABS 0.6 02/24/2018 1512   EOSABS 0.1 02/24/2018 1512   BASOSABS 0.0 02/24/2018 1512      Latest Ref Rng & Units 05/25/2022   11:11 AM 01/26/2022   12:18 PM 11/21/2021    9:21 AM  BMP  Glucose 70 - 99 mg/dL 604  77  540   BUN 8 - 27 mg/dL 16  17  16    Creatinine 0.57 - 1.00 mg/dL  9.81  1.91   BUN/Creat Ratio 12 - 28 18  19  20    Sodium 134 - 144 mmol/L 144  142  139   Potassium 3.5 - 5.2  mmol/L 3.6  3.9  3.4   Chloride 96 - 106 mmol/L 104  102  100   CO2 20 - 29 mmol/L 23  24  24    Calcium 8.7 - 10.3 mg/dL 4.78  9.8     Chest imaging: HRCT Chest 05/05/21 1. No findings to suggest interstitial lung disease. 2. Mild air trapping indicative of mild small airways disease. 3. Aortic atherosclerosis, in addition to left main and left anterior descending coronary artery disease. Assessment for potential risk factor modification, dietary therapy or pharmacologic therapy may be warranted, if clinically indicated. 4. Small hiatal hernia. 5. Colonic diverticulosis. Addendum: Borderline enlarged and mildly enlarged lymph nodes in the axillary regions bilaterally (right greater than left). This is of uncertain etiology and significance. However, correlation with physical examination and mammography is recommended in the near future.  CXR 2019 Heart size is normal. Atherosclerosis and tortuosity of the aorta. 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 No sign of active infiltrate, mass, effusion or collapse. Ordinary degenerative changes affect the spine.  PFT:    Latest Ref Rng & Units 04/13/2021   10:10 AM  PFT Results  DLCO uncorrected ml/min/mmHg 21.56   DLCO UNC% % 106   DLCO corrected ml/min/mmHg 21.56   DLCO COR %Predicted % 106   DLVA Predicted % 144   TLC L 5.23   TLC % Predicted % 99   RV % Predicted % 154    Echo 2017: - LVEF 65-70%, moderate LVH,  normal wall motion, diastolic    dysfunction, indeterminate LV filling pressure, trivial MR,    normal LA size, mild TR, rVSP 38 mmHg, normal IVC.    Assessment & Plan:   Chronic obstructive pulmonary disease, unspecified COPD type (HCC) - Plan: ipratropium-albuterol (DUONEB) 0.5-2.5 (3) MG/3ML SOLN, Ambulatory Referral for DME, AMB referral to pulmonary rehabilitation  Discussion: Katherine Summers is a 76 year old woman, former smoker with  hypertension, hyperlipidemia and obstructive sleep apnea who returns to pulmonary clinic for COPD.  She is to use duoneb nebulizer treatments every 6 hours. If she feels the nebulizer treatment is helping, she can resume her trelegy ellipta 1 puff daily after the morning time nebulizer treatment.   We will order her nebulizer machine and supplies.   We will refer her to pulmonary rehab.   Follow-up in 3 months.  Melody Comas, MD [facility] Office: [phone]   Current Outpatient Medications:    albuterol (VENTOLIN HFA) 108 (90 Base) MCG/ACT inhaler, Inhale 2 puffs into the lungs every 6 (six) hours as needed for wheezing or shortness of breath., Disp: 8 g, Rfl: 6   amLODipine (NORVASC) 5 MG tablet, TAKE 1 TABLET(5 MG) BY MOUTH DAILY, Disp: 90 tablet, Rfl: 2   atorvastatin (LIPITOR) 20 MG tablet, Take 1 tab Monday, Wednesday and Friday., Disp: 90 tablet, Rfl: 2   escitalopram (LEXAPRO) 10 MG tablet, TAKE 1 TABLET(10 MG) BY MOUTH DAILY, Disp: 90 tablet, Rfl: 1   Fluticasone-Umeclidin-Vilant (TRELEGY ELLIPTA) 100-62.5-25 MCG/ACT AEPB, Inhale 1 puff into the lungs daily., Disp: 28 each, Rfl: 11   ipratropium-albuterol (DUONEB) 0.5-2.5 (3) MG/3ML SOLN, Take 3 mLs by nebulization every 6 (six) hours as needed (shortness of breath, cough or wheezing)., Disp: 360 mL, Rfl: 6   lisinopril-hydrochlorothiazide (ZESTORETIC) 20-12.5 MG tablet, TAKE 1 TABLET BY MOUTH DAILY, Disp: 90 tablet, Rfl: 2   VITAMIN D PO, Take 5,000 mg by mouth. Take 1 daily mon through fri, Disp: , Rfl:

## 2022-07-19 NOTE — Patient Instructions (Signed)
We will start you on nebulizer treatments to see if this helps compared to using the inhalers.  Start duoneb nebulizer treatments every 6 hours as needed for shortness of breath  We will order a nebulizer machine and supplies  If you do notice that the nebulizer machine is helping, then you can try taking 1 puff of trelegy ellipta daily about 30 minutes after your nebulizer treatment  We will refer you to pulmonary rehab  Follow up in 3 months

## 2022-07-21 ENCOUNTER — Encounter: Payer: Self-pay | Admitting: Pulmonary Disease

## 2022-07-31 ENCOUNTER — Telehealth (HOSPITAL_COMMUNITY): Payer: Self-pay

## 2022-07-31 NOTE — Telephone Encounter (Addendum)
Called Katherine Summers to see if she was interested in Pulmonary Rehab or [facility] PREP since she has a referral for both. Katherine Summers does not want to travel during the colder months. She stated that she wanted to be held off and scheduled for Pulmonary Rehab in the spring time. Will notify our nurse navigator of this.

## 2022-08-02 NOTE — Progress Notes (Signed)
Rescheduled 12/12  [facility] Coordination Care Guide  Direct Dial: [phone]

## 2022-08-15 ENCOUNTER — Ambulatory Visit: Payer: Self-pay

## 2022-08-15 NOTE — Patient Outreach (Signed)
  Care Coordination   Follow Up Visit Note   08/15/2022 Name: Katherine Summers MRN: [id number] DOB: 1946-04-01  Katherine Summers is a 76 y.o. year old female who sees Dorothyann Peng, MD for primary care. I spoke with  Hershal Coria by phone today.  What matters to the patients health and wellness today?  Patient will fill her medication for her nebulizer and start using it as directed. Patient will f/u with the lipid clinic as directed.     Goals Addressed               This Visit's Progress     Patient Stated     COMPLETED: I still have shortness of breath with activity (pt-stated)        Care Coordination Interventions: Provided patient with basic written and verbal COPD education on self care/management/and exacerbation prevention Review of patient status, including review of consultant's reports, relevant laboratory and other test results, and medications completed Provided instruction about proper use of medications used for management of COPD including inhalers         COMPLETED: My doctor referred me to the lipid clinic (pt-stated)        Care Coordination Interventions: Provider established cholesterol goals reviewed Reviewed importance of limiting foods high in cholesterol Reviewed exercise goals and target of 150 minutes per week Reviewed upcoming appointment with Dr. Rennis Golden for evaluation of Hyperlipidemia Educated patient regarding the PREP program, sent in basket message to nurse Viann Fish regarding recent PREP referral            SDOH assessments and interventions completed:  No     Care Coordination Interventions:  Yes, provided   Follow up plan: No further intervention required.   Encounter Outcome:  Pt. Visit Completed

## 2022-08-15 NOTE — Patient Instructions (Signed)
Visit Information  Thank you for taking time to visit with me today. Please don't hesitate to contact me if I can be of assistance to you.   Following are the goals we discussed today:   Goals Addressed               This Visit's Progress     Patient Stated     COMPLETED: I still have shortness of breath with activity (pt-stated)        Care Coordination Interventions: Provided patient with basic written and verbal COPD education on self care/management/and exacerbation prevention Review of patient status, including review of consultant's reports, relevant laboratory and other test results, and medications completed Provided instruction about proper use of medications used for management of COPD including inhalers         COMPLETED: My doctor referred me to the lipid clinic (pt-stated)        Care Coordination Interventions: Provider established cholesterol goals reviewed Reviewed importance of limiting foods high in cholesterol Reviewed exercise goals and target of 150 minutes per week Reviewed upcoming appointment with Dr. Rennis Golden for evaluation of Hyperlipidemia Educated patient regarding the PREP program, sent in basket message to nurse Viann Fish regarding recent PREP referral            If you are experiencing a Mental Health or Behavioral Health Crisis or need someone to talk to, please call 1-800-273-TALK (toll free, 24 hour hotline)  The patient verbalized understanding of instructions, educational materials, and care plan provided today and agreed to receive a mailed copy of patient instructions, educational materials, and care plan.   Delsa Sale, RN, BSN, CCM Care Management Coordinator [facility] Care Management  Direct Phone: [phone]

## 2022-08-18 NOTE — Progress Notes (Signed)
Per review of chart, pt was referred to PREP and Cardiac Rehab and has decided to do pulm rehab in the Spring.  If pt patient, needs PREP in the future, pls re-fer.

## 2022-11-11 ENCOUNTER — Other Ambulatory Visit: Payer: Self-pay | Admitting: Internal Medicine

## 2022-11-20 ENCOUNTER — Ambulatory Visit: Payer: Medicare Other | Attending: Internal Medicine | Admitting: Internal Medicine

## 2022-11-20 ENCOUNTER — Encounter: Payer: Self-pay | Admitting: Internal Medicine

## 2022-11-20 VITALS — BP 122/73 | HR 80 | Ht 65.5 in | Wt 178.0 lb

## 2022-11-20 DIAGNOSIS — I10 Essential (primary) hypertension: Secondary | ICD-10-CM | POA: Diagnosis not present

## 2022-11-20 DIAGNOSIS — E785 Hyperlipidemia, unspecified: Secondary | ICD-10-CM | POA: Diagnosis not present

## 2022-11-20 NOTE — Patient Instructions (Signed)
Medication Instructions:  HOLD atorvastatin for 2-3 weeks. Please call with an update or send a message via MyChart  *If you need a refill on your cardiac medications before your next appointment, please call your pharmacy*    Follow-Up: At [facility], you and your health needs are our priority.  As part of our continuing mission to provide you with exceptional heart care, we have created designated Provider Care Teams.  These Care Teams include your primary Cardiologist (physician) and Advanced Practice Providers (APPs -  Physician Assistants and Nurse Practitioners) who all work together to provide you with the care you need, when you need it.  We recommend signing up for the patient portal called "MyChart".  Sign up information is provided on this After Visit Summary.  MyChart is used to connect with patients for Virtual Visits (Telemedicine).  Patients are able to view lab/test results, encounter notes, upcoming appointments, etc.  Non-urgent messages can be sent to your provider as well.   To learn more about what you can do with MyChart, go to [email or web address].    Your next appointment:    4-5 months with Dr. Debara Pickett

## 2022-11-20 NOTE — Progress Notes (Signed)
LIPID CLINIC CONSULT NOTE  Chief Complaint:  Leg pain  Primary Care Physician: Glendale Chard, MD  Primary Cardiologist:  None  HPI:  Katherine Summers is a 77 y.o. female who is being seen today for the evaluation of leg pain at the request of Glendale Chard, MD. This is a pleasant 77 year old female kindly referred for evaluation management of leg pain.  She recently has been reporting worsening leg pain and noted that her brother who was also on atorvastatin had had leg pain that improved with coenzyme Q 10.  She is concerned about statin side effects.  She was advised by her PCP to decrease her statin, which she has been on for years without any significant side effects to every other day dosing.  This did not seem to provide any improvement and therefore she went back to daily dosing.  She last took the dose this morning.  Her most recent lipid profile 5 months ago showed total cholesterol 170, triglycerides 85, LDL 97 and HDL 57.  She has no known cardiovascular disease but does have some aortic atherosclerosis.  1 main issue is COPD followed by Dr. Erin Fulling.  She also has a history of hypertension.  She does not have diabetes.  PMHx:  Past Medical History:  Diagnosis Date   Allergy    CKD (chronic kidney disease) stage 2, GFR 60-89 ml/min    COPD (chronic obstructive pulmonary disease) ([place])    Depression 08/01/2016   Dizziness and giddiness    Essential hypertension 08/01/2016   Hyperlipidemia    Hypertension    Murmur 08/01/2016   Pre-diabetes    Shortness of breath 08/01/2016   Vitamin D deficiency     Past Surgical History:  Procedure Laterality Date   CATARACT EXTRACTION, BILATERAL  2021   COLONOSCOPY     EYE SURGERY     FOOT SURGERY     TONSILLECTOMY     TOTAL KNEE ARTHROPLASTY Right 10/31/2016   Procedure: RIGHT TOTAL KNEE ARTHROPLASTY;  Surgeon: Paralee Cancel, MD;  Location: [facility];  Service: Orthopedics;  Laterality: Right;  Requesting for 70 mins   TUBAL LIGATION       FAMHx:  Family History  Problem Relation Age of Onset   Stroke Mother    Colon cancer Father    Colon cancer Sister    Stroke Maternal Grandmother    Colon cancer Paternal Grandfather     SOCHx:   reports that she quit smoking about 26 years ago. Her smoking use included cigarettes. She has a 3.00 pack-year smoking history. She has never used smokeless tobacco. She reports that she does not currently use alcohol. She reports that she does not use drugs.  ALLERGIES:  No Known Allergies  ROS: Pertinent items noted in HPI and remainder of comprehensive ROS otherwise negative.  HOME MEDS: Current Outpatient Medications on File Prior to Visit  Medication Sig Dispense Refill   albuterol (VENTOLIN HFA) 108 (90 Base) MCG/ACT inhaler Inhale 2 puffs into the lungs every 6 (six) hours as needed for wheezing or shortness of breath. 8 g 6   amLODipine (NORVASC) 5 MG tablet TAKE 1 TABLET(5 MG) BY MOUTH DAILY 90 tablet 2   atorvastatin (LIPITOR) 20 MG tablet Take 1 tab Monday, Wednesday and Friday. 90 tablet 2   escitalopram (LEXAPRO) 10 MG tablet TAKE 1 TABLET(10 MG) BY MOUTH DAILY 90 tablet 1   Fluticasone-Umeclidin-Vilant (TRELEGY ELLIPTA) 100-62.5-25 MCG/ACT AEPB Inhale 1 puff into the lungs daily. 28 each 11  ipratropium-albuterol (DUONEB) 0.5-2.5 (3) MG/3ML SOLN Take 3 mLs by nebulization every 6 (six) hours as needed (shortness of breath, cough or wheezing). 360 mL 6   lisinopril-hydrochlorothiazide (ZESTORETIC) 20-12.5 MG tablet TAKE 1 TABLET BY MOUTH DAILY 90 tablet 2   VITAMIN D PO Take 5,000 mg by mouth. Take 1 daily mon through fri     No current facility-administered medications on file prior to visit.    LABS/IMAGING: No results found for this or any previous visit (from the past 48 hour(s)). No results found.  LIPID PANEL:    Component Value Date/Time   CHOL 170 05/25/2022 1109   TRIG 85 05/25/2022 1109   HDL 57 05/25/2022 1109   CHOLHDL 3.0 05/25/2022 1109    LDLCALC 97 05/25/2022 1109    WEIGHTS: Wt Readings from Last 3 Encounters:  11/20/22 178 lb (80.7 kg)  07/19/22 175 lb (79.4 kg)  07/10/22 172 lb 6.4 oz (78.2 kg)    VITALS: BP 122/73   Pulse 80   Ht 5' 5.5" (1.664 m)   Wt 178 lb (80.7 kg)   SpO2 99%   BMI 29.17 kg/m   EXAM: Deferred  EKG: Deferred  ASSESSMENT: Possible statin intolerance-myalgias Mixed dyslipidemia, goal LDL less than 70 Hypertension COPD  PLAN: 1.   Katherine Summers has possible statin intolerance to atorvastatin.  She has been on this for years but more recently has had pain in her legs and is using a walker to get around.  She has been told this mostly arthritis but her brother had side effects on statins that improved with coenzyme Q 10.  I shared with her that the results of studies on coenzyme Q10 are mixed including a meta-analysis which was negative as far as benefit.  She is more likely to derive benefit from switching statins or to a nonstatin alternative.  She has not really taken a statin holiday.  I would advise that today.  She should contact me in 2 to 4 weeks to see if her symptoms have improved and we can consider alternative statin or nonstatin therapies.  Thanks again for the kind referral.  Pixie Casino, MD, FACC, [place] Director of the Advanced Lipid Disorders &  Cardiovascular Risk Reduction Clinic Diplomate of the American Board of Clinical Lipidology Attending Cardiologist  Direct Dial: [phone]  Fax: [phone]  Website:  [email or web address] Rosa 11/20/2022, 9:21 AM

## 2022-12-06 ENCOUNTER — Encounter: Payer: Self-pay | Admitting: Internal Medicine

## 2022-12-06 ENCOUNTER — Ambulatory Visit (INDEPENDENT_AMBULATORY_CARE_PROVIDER_SITE_OTHER): Payer: Medicare Other | Admitting: Internal Medicine

## 2022-12-06 VITALS — BP 136/70 | HR 87 | Temp 98.3°F | Ht 65.0 in | Wt 178.2 lb

## 2022-12-06 DIAGNOSIS — I10 Essential (primary) hypertension: Secondary | ICD-10-CM

## 2022-12-06 DIAGNOSIS — R7309 Other abnormal glucose: Secondary | ICD-10-CM

## 2022-12-06 DIAGNOSIS — F3341 Major depressive disorder, recurrent, in partial remission: Secondary | ICD-10-CM

## 2022-12-06 DIAGNOSIS — E7800 Pure hypercholesterolemia, unspecified: Secondary | ICD-10-CM | POA: Diagnosis not present

## 2022-12-06 DIAGNOSIS — E2839 Other primary ovarian failure: Secondary | ICD-10-CM

## 2022-12-06 DIAGNOSIS — J449 Chronic obstructive pulmonary disease, unspecified: Secondary | ICD-10-CM

## 2022-12-06 DIAGNOSIS — Z23 Encounter for immunization: Secondary | ICD-10-CM

## 2022-12-06 DIAGNOSIS — Z6829 Body mass index (BMI) 29.0-29.9, adult: Secondary | ICD-10-CM

## 2022-12-06 DIAGNOSIS — Z0000 Encounter for general adult medical examination without abnormal findings: Secondary | ICD-10-CM

## 2022-12-06 DIAGNOSIS — E663 Overweight: Secondary | ICD-10-CM

## 2022-12-06 DIAGNOSIS — R0602 Shortness of breath: Secondary | ICD-10-CM

## 2022-12-06 LAB — POCT URINALYSIS DIPSTICK
Bilirubin, UA: NEGATIVE
Glucose, UA: NEGATIVE
Ketones, UA: NEGATIVE
Nitrite, UA: NEGATIVE
Protein, UA: NEGATIVE
Spec Grav, UA: 1.025 (ref 1.010–1.025)
Urobilinogen, UA: 0.2 E.U./dL
pH, UA: 6 (ref 5.0–8.0)

## 2022-12-06 NOTE — Patient Instructions (Signed)

## 2022-12-06 NOTE — Progress Notes (Signed)
I,Katherine Summers,acting as a scribe for Katherine Aliment, MD.,have documented all relevant documentation on the behalf of Katherine Aliment, MD,as directed by  Katherine Aliment, MD while in the presence of Katherine Aliment, MD.   Subjective:     Patient ID: Katherine Summers , female    DOB: [birth year] , 77 y.o.   MRN: [id number]   Chief Complaint  Patient presents with   Annual Exam   Hypertension    HPI  Patient presents today for a physical exam.  She reports compliance with meds. Patient has no questions or concerns at this time.  She denies headache, chest pain, and SOB.   She reports having a recent fall, occurred 12/04/22. She states her nephew and grandson were trying to pull her up out the chair, they let go and she fell backwards. She states she fell to the ground. Her cousin then helped her off of the ground. She says she did not suffer any injuries.   Patient also recently visited cardiologist. She went in for cholesterol follow up. Specialist took her off of atorvastatin, she states the medication caused leg pain. She has no other concerns at this time.   Hypertension This is a chronic problem. The current episode started more than 1 year ago. The problem has been gradually improving since onset. The problem is controlled. Associated symptoms include shortness of breath. Pertinent negatives include no blurred vision, chest pain or palpitations. Risk factors for coronary artery disease include obesity, post-menopausal state, stress and sedentary lifestyle. The current treatment provides moderate improvement. Compliance problems include exercise.      Past Medical History:  Diagnosis Date   Allergy    CKD (chronic kidney disease) stage 2, GFR 60-89 ml/min    COPD (chronic obstructive pulmonary disease)    Depression 08/01/2016   Dizziness and giddiness    Essential hypertension 08/01/2016   Hyperlipidemia    Hypertension    Murmur 08/01/2016   Pre-diabetes    Shortness of  breath 08/01/2016   Vitamin D deficiency      Family History  Problem Relation Age of Onset   Stroke Mother    Colon cancer Father    Colon cancer Sister    Stroke Maternal Grandmother    Colon cancer Paternal Grandfather      Current Outpatient Medications:    albuterol (VENTOLIN HFA) 108 (90 Base) MCG/ACT inhaler, Inhale 2 puffs into the lungs every 6 (six) hours as needed for wheezing or shortness of breath., Disp: 8 g, Rfl: 6   amLODipine (NORVASC) 5 MG tablet, TAKE 1 TABLET(5 MG) BY MOUTH DAILY, Disp: 90 tablet, Rfl: 2   escitalopram (LEXAPRO) 10 MG tablet, TAKE 1 TABLET(10 MG) BY MOUTH DAILY, Disp: 90 tablet, Rfl: 1   Fluticasone-Umeclidin-Vilant (TRELEGY ELLIPTA) 100-62.5-25 MCG/ACT AEPB, Inhale 1 puff into the lungs daily., Disp: 28 each, Rfl: 11   ipratropium-albuterol (DUONEB) 0.5-2.5 (3) MG/3ML SOLN, Take 3 mLs by nebulization every 6 (six) hours as needed (shortness of breath, cough or wheezing)., Disp: 360 mL, Rfl: 6   lisinopril-hydrochlorothiazide (ZESTORETIC) 20-12.5 MG tablet, TAKE 1 TABLET BY MOUTH DAILY, Disp: 90 tablet, Rfl: 2   VITAMIN D PO, Take 5,000 mg by mouth. Take 1 daily mon through fri, Disp: , Rfl:    atorvastatin (LIPITOR) 20 MG tablet, Take 1 tab Monday, Wednesday and Friday. (Patient not taking: Reported on 12/06/2022), Disp: 90 tablet, Rfl: 2   No Known Allergies    The patient states she  uses post menopausal status for birth control. Last LMP was No LMP recorded. Patient is postmenopausal.. Negative for Dysmenorrhea. Negative for: breast discharge, breast lump(s), breast pain and breast self exam. Associated symptoms include abnormal vaginal bleeding. Pertinent negatives include abnormal bleeding (hematology), anxiety, decreased libido, depression, difficulty falling sleep, dyspareunia, history of infertility, nocturia, sexual dysfunction, sleep disturbances, urinary incontinence, urinary urgency, vaginal discharge and vaginal itching. Diet regular.The  patient states her exercise level is    . The patient's tobacco use is:  Social History   Tobacco Use  Smoking Status Former   Packs/day: 0.25   Years: 12.00   Additional pack years: 0.00   Total pack years: 3.00   Types: Cigarettes   Quit date: 1998   Years since quitting: 26.2  Smokeless Tobacco Never  . She has been exposed to passive smoke. The patient's alcohol use is:  Social History   Substance and Sexual Activity  Alcohol Use Not Currently   Comment: corona ever now and then   Review of Systems  Constitutional: Negative.   HENT: Negative.    Eyes: Negative.  Negative for blurred vision.  Respiratory:  Positive for shortness of breath.        She c/o occasional sob. Denies orthopnea. Usually occurs w/ exertion. No associated cp, palpitations, new LE edema.   Cardiovascular: Negative.  Negative for chest pain and palpitations.  Gastrointestinal: Negative.   Endocrine: Negative.   Genitourinary: Negative.   Musculoskeletal: Negative.   Skin: Negative.   Allergic/Immunologic: Negative.   Neurological: Negative.   Hematological: Negative.   Psychiatric/Behavioral: Negative.       Today's Vitals   12/06/22 0902  BP: 136/70  Pulse: 87  Temp: 98.3 F (36.8 C)  SpO2: 98%  Weight: 178 lb 3.2 oz (80.8 kg)  Height: 5\' 5"  (1.651 m)   Body mass index is 29.65 kg/m.  Wt Readings from Last 3 Encounters:  12/06/22 178 lb 3.2 oz (80.8 kg)  11/20/22 178 lb (80.7 kg)  07/19/22 175 lb (79.4 kg)    Objective:  Physical Exam Vitals and nursing note reviewed.  Constitutional:      Appearance: Normal appearance.     Comments: Pt examined while in chair  HENT:     Head: Normocephalic and atraumatic.     Right Ear: Tympanic membrane, ear canal and external ear normal.     Left Ear: Tympanic membrane, ear canal and external ear normal.     Nose:     Comments: Masked     Mouth/Throat:     Comments: Masked  Eyes:     Extraocular Movements: Extraocular movements  intact.     Conjunctiva/sclera: Conjunctivae normal.     Pupils: Pupils are equal, round, and reactive to light.  Cardiovascular:     Rate and Rhythm: Normal rate and regular rhythm.     Pulses: Normal pulses.     Heart sounds: Normal heart sounds.  Pulmonary:     Effort: Pulmonary effort is normal.     Breath sounds: Normal breath sounds.  Chest:  Breasts:    Tanner Score is 5.     Right: Normal.     Left: Normal.  Abdominal:     General: Abdomen is flat. Bowel sounds are normal.     Palpations: Abdomen is soft.  Genitourinary:    Comments: deferred Musculoskeletal:        General: Normal range of motion.     Cervical back: Normal range of motion and neck supple.  Skin:    General: Skin is warm and dry.  Neurological:     General: No focal deficit present.     Mental Status: She is alert and oriented to person, place, and time.  Psychiatric:        Mood and Affect: Mood normal.        Behavior: Behavior normal.         Assessment And Plan:     1. Encounter for general adult medical examination w/o abnormal findings Comments: A full exam was performed. Importance of monthly self breast exams was discussed with the patient.  PATIENT IS ADVISED TO GET 30-45 MINUTES REGULAR EXERCISE NO LESS THAN FOUR TO FIVE DAYS PER WEEK - BOTH WEIGHTBEARING EXERCISES AND AEROBIC ARE RECOMMENDED.  PATIENT IS ADVISED TO FOLLOW A HEALTHY DIET WITH AT LEAST SIX FRUITS/VEGGIES PER DAY, DECREASE INTAKE OF RED MEAT, AND TO INCREASE FISH INTAKE TO TWO DAYS PER WEEK.  MEATS/FISH SHOULD NOT BE FRIED, BAKED OR BROILED IS PREFERABLE.  IT IS ALSO IMPORTANT TO CUT BACK ON YOUR SUGAR INTAKE. PLEASE AVOID ANYTHING WITH ADDED SUGAR, CORN SYRUP OR OTHER SWEETENERS. IF YOU MUST USE A SWEETENER, YOU CAN TRY STEVIA. IT IS ALSO IMPORTANT TO AVOID ARTIFICIALLY SWEETENERS AND DIET BEVERAGES. LASTLY, I SUGGEST WEARING SPF 50 SUNSCREEN ON EXPOSED PARTS AND ESPECIALLY WHEN IN THE DIRECT SUNLIGHT FOR AN EXTENDED PERIOD OF  TIME.  PLEASE AVOID FAST FOOD RESTAURANTS AND INCREASE YOUR WATER INTAKE.  2. Essential hypertension Comments: Chronic, fair control. EKG performed, NSR, w/ nonspecific ST depression. She will c/w lisinopril/hct 20/12.5mg  and amlodipine 5mg  daily.  She is encouraged to follow low sodium diet. Goal BP<130/80. She will rto in 4-6 months for re-evaluation.  - POCT Urinalysis Dipstick (81002) - EKG 12-Lead - Microalbumin / creatinine urine ratio  3. Recurrent major depressive disorder, in partial remission Comments: Chronic. Her symptoms are stable on escitalopram. Pt is encouraged to take meds as prescribed. She will f/uin 6 months.  4. Pure hypercholesterolemia Comments: Chronic, now off of atorvastatin. She has had myalgias w/ other statins as well. She is encouraged to follow heart healthy diet.  5. Other abnormal glucose Comments: Previous labs reviewed, a1c has been elevated in the past. I will recheck an a1c today, she is encouraged to limit her intake of sugary foods/drinks.  6. Shortness of breath Comments: I will check labs as below. I will also refer her for 2d echocardiogram. - PCV ECHOCARDIOGRAM COMPLETE; Future - Brain natriuretic peptide  7. Estrogen deficiency Comments: Bone density ordered in Sept 2023, will check w/ referral coordinator to f/u with scheduling. Last dexa performed in 2020.  8. Overweight with body mass index (BMI) of 29 to 29.9 in adult Comments: She is encouraged to aim for at least 150 minutes of exercise/week. BMI is acceptable for her demographic.  9. Need for Tdap vaccination - Tdap vaccine greater than or equal to 7yo IM  Patient was given opportunity to ask questions. Patient verbalized understanding of the plan and was able to repeat key elements of the plan. All questions were answered to their satisfaction.   I, Katherine Aliment, MD, have reviewed all documentation for this visit. The documentation on 12/06/22 for the exam, diagnosis,  procedures, and orders are all accurate and complete.   THE PATIENT IS ENCOURAGED TO PRACTICE SOCIAL DISTANCING DUE TO THE COVID-19 PANDEMIC.

## 2022-12-07 LAB — MICROALBUMIN / CREATININE URINE RATIO
Creatinine, Urine: 162.6 mg/dL
Microalb/Creat Ratio: 11 mg/g creat (ref 0–29)
Microalbumin, Urine: 18.4 ug/mL

## 2022-12-07 LAB — BRAIN NATRIURETIC PEPTIDE: BNP: 65.1 pg/mL (ref 0.0–100.0)

## 2022-12-13 DIAGNOSIS — R7309 Other abnormal glucose: Secondary | ICD-10-CM | POA: Insufficient documentation

## 2022-12-13 DIAGNOSIS — E2839 Other primary ovarian failure: Secondary | ICD-10-CM | POA: Insufficient documentation

## 2022-12-27 LAB — HM DEXA SCAN: HM Dexa Scan: NORMAL

## 2023-01-02 ENCOUNTER — Encounter: Payer: Self-pay | Admitting: Internal Medicine

## 2023-02-07 ENCOUNTER — Ambulatory Visit: Payer: Medicare Other | Admitting: Internal Medicine

## 2023-02-07 ENCOUNTER — Ambulatory Visit (INDEPENDENT_AMBULATORY_CARE_PROVIDER_SITE_OTHER): Payer: Medicare Other

## 2023-02-07 VITALS — Ht 65.0 in | Wt 170.0 lb

## 2023-02-07 DIAGNOSIS — Z0000 Encounter for general adult medical examination without abnormal findings: Secondary | ICD-10-CM | POA: Diagnosis not present

## 2023-02-07 NOTE — Progress Notes (Signed)
I connected with  Hershal Coria on 02/07/23 by a audio enabled telemedicine application and verified that I am speaking with the correct person using two identifiers.  Patient Location: Home  Provider Location: Office/Clinic  I discussed the limitations of evaluation and management by telemedicine. The patient expressed understanding and agreed to proceed. Subjective:   Katherine Summers is a 77 y.o. female who presents for Medicare Annual (Subsequent) preventive examination.  Review of Systems     Cardiac Risk Factors include: advanced age (>58men, >58 women);hypertension     Objective:    Today's Vitals   02/07/23 0956  Weight: 170 lb (77.1 kg)  Height: 5\' 5"  (1.651 m)   Body mass index is 28.29 kg/m.     02/07/2023   10:01 AM 01/26/2022   12:02 PM 12/21/2021   11:43 AM 12/22/2020   12:09 PM 11/12/2019    9:28 AM 03/18/2019   10:05 AM 10/31/2018   10:30 AM  Advanced Directives  Does Patient Have a Medical Advance Directive? Yes Yes Yes Yes Yes Yes Yes  Type of Estate agent of [place];Living will Healthcare Power of [place];Living will Living will;Healthcare Power of [employer] Power of [place];Living will Healthcare Power of [place];Living will Healthcare Power of [place];Living will Living will;Healthcare Power of Attorney  Does patient want to make changes to medical advance directive?   No - Patient declined   No - Patient declined No - Patient declined  Copy of Healthcare Power of Attorney in Chart? No - copy requested No - copy requested No - copy requested No - copy requested No - copy requested No - copy requested No - copy requested    Current Medications (verified) Outpatient Encounter Medications as of 02/07/2023  Medication Sig   albuterol (VENTOLIN HFA) 108 (90 Base) MCG/ACT inhaler Inhale 2 puffs into the lungs every 6 (six) hours as needed for wheezing or shortness of breath.   amLODipine (NORVASC) 5 MG tablet TAKE 1 TABLET(5 MG)  BY MOUTH DAILY   escitalopram (LEXAPRO) 10 MG tablet TAKE 1 TABLET(10 MG) BY MOUTH DAILY   Fluticasone-Umeclidin-Vilant (TRELEGY ELLIPTA) 100-62.5-25 MCG/ACT AEPB Inhale 1 puff into the lungs daily.   ipratropium-albuterol (DUONEB) 0.5-2.5 (3) MG/3ML SOLN Take 3 mLs by nebulization every 6 (six) hours as needed (shortness of breath, cough or wheezing).   lisinopril-hydrochlorothiazide (ZESTORETIC) 20-12.5 MG tablet TAKE 1 TABLET BY MOUTH DAILY   VITAMIN D PO Take 5,000 mg by mouth. Take 1 daily mon through fri   atorvastatin (LIPITOR) 20 MG tablet Take 1 tab Monday, Wednesday and Friday. (Patient not taking: Reported on 12/06/2022)   No facility-administered encounter medications on file as of 02/07/2023.    Allergies (verified) Patient has no known allergies.   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   Procedure: RIGHT TOTAL KNEE ARTHROPLASTY;  Surgeon:  Durene Romans, MD;  Location: [facility];  Service: Orthopedics;  Laterality: Right;  Requesting for 70 mins   TUBAL LIGATION     Family History  Problem Relation Age of Onset   Stroke Mother    Colon cancer Father    Colon cancer Sister    Stroke Maternal Grandmother    Colon cancer Paternal Grandfather    Social History   Socioeconomic History   Marital status: Single    Spouse name: Not on file   Number of children: Not on file   Years of education: Not on file   Highest education  level: Not on file  Occupational History   Occupation: retired  Tobacco Use   Smoking status: Former    Packs/day: 0.25    Years: 12.00    Additional pack years: 0.00    Total pack years: 3.00    Types: Cigarettes    Quit date: 1998    Years since quitting: 26.4   Smokeless tobacco: Never  Vaping Use   Vaping Use: Never used  Substance and Sexual Activity   Alcohol use: Not Currently    Comment: corona ever now and then   Drug use: No   Sexual activity: Not Currently  Other Topics Concern   Not on file  Social History Narrative   Not on file   Social Determinants of Health   Financial Resource Strain: Low Risk  (02/07/2023)   Overall Financial Resource Strain (CARDIA)    Difficulty of Paying Living Expenses: Not hard at all  Food Insecurity: No Food Insecurity (02/07/2023)   Hunger Vital Sign    Worried About Running Out of Food in the Last Year: Never true    Ran Out of Food in the Last Year: Never true  Transportation Needs: No Transportation Needs (02/07/2023)   PRAPARE - Administrator, Civil Service (Medical): No    Lack of Transportation (Non-Medical): No  Physical Activity: Inactive (02/07/2023)   Exercise Vital Sign    Days of Exercise per Week: 0 days    Minutes of Exercise per Session: 0 min  Stress: No Stress Concern Present (02/07/2023)   [employer] of Occupational Health - Occupational Stress Questionnaire    Feeling of Stress : Not at all  Social Connections: Not on file    Tobacco Counseling Counseling given: Not Answered   Clinical Intake:  Pre-visit preparation completed: Yes  Pain : No/denies pain     Nutritional Status: BMI 25 -29 Overweight Nutritional Risks: None Diabetes: No  How often do you need to have someone help you when you read instructions, pamphlets, or other written materials from your doctor or pharmacy?: 1 - Never  Diabetic? no  Interpreter Needed?: No  Information entered by :: NAllen LPN   Activities  of Daily Living    02/07/2023   10:05 AM  In your present state of health, do you have any difficulty performing the following activities:  Hearing? 1  Vision? 1  Difficulty concentrating or making decisions? 0  Walking or climbing stairs? 1  Dressing or bathing? 0  Doing errands, shopping? 0  Preparing Food and eating ? N  Using the Toilet? N  In the past six months, have you accidently leaked urine? Y  Do you have problems with loss of bowel control? N  Managing your Medications? N  Managing your Finances? N  Housekeeping or managing your Housekeeping? N    Patient Care Team: Dorothyann Peng, MD as PCP -  General (Internal Medicine) Harlan Stains, [facility] (Pharmacist)  Indicate any recent Medical Services you may have received from other than [facility] providers in the past year (date may be approximate).     Assessment:   This is a routine wellness examination for [place].  Hearing/Vision screen Vision Screening - Comments:: Regular eye exams, [facility]  Dietary issues and exercise activities discussed: Current Exercise Habits: The patient does not participate in regular exercise at present   Goals Addressed             This Visit's Progress    Patient Stated       02/07/2023, no goals       Depression Screen    02/07/2023   10:04 AM 12/06/2022    9:02 AM 07/10/2022    4:14 PM 05/25/2022   10:22 AM 01/26/2022   12:04 PM 11/21/2021    8:25 AM 06/29/2021   10:58 AM  PHQ 2/9 Scores  PHQ - 2 Score 1 0 0 0 0 0 0  PHQ- 9 Score  0 6 0  0 0    Fall Risk    02/07/2023   10:02 AM 12/06/2022    9:02 AM 07/10/2022    4:13 PM 01/26/2022   12:03 PM 01/26/2022   11:24 AM  Fall Risk   Falls in the past year? 1 1 0 0 0  Comment people helping get out of chair and they dropped her      Number falls in past yr: 0 0 0 0 0  Injury with Fall? 0 0 0 0 0  Risk for fall due to : Medication side effect;Impaired mobility;Impaired balance/gait History of fall(s) No Fall Risks  Medication side effect No Fall Risks  Follow up Falls prevention discussed Falls evaluation completed Falls evaluation completed Falls evaluation completed;Education provided;Falls prevention discussed Falls evaluation completed    FALL RISK PREVENTION PERTAINING TO THE HOME:  Any stairs in or around the home? No  If so, are there any without handrails?  N/a Home free of loose throw rugs in walkways, pet beds, electrical cords, etc? Yes  Adequate lighting in your home to reduce risk of falls? Yes   ASSISTIVE DEVICES UTILIZED TO PREVENT FALLS:  Life alert? No  Use of a cane, walker or w/c? Yes  Grab bars in the bathroom? Yes  Shower chair or bench in shower? Yes  Elevated toilet seat or a handicapped toilet? Yes   TIMED UP AND GO:  Was the test performed? No .      Cognitive Function:        02/07/2023   10:08 AM 01/26/2022   12:05 PM 12/22/2020   12:12 PM 11/12/2019    9:32 AM 10/31/2018   10:34 AM  6CIT Screen  What Year? 0 points 0 points 0 points 0 points 0 points  What month? 0 points 0 points 0 points 0 points 0 points  What time? 0 points 0 points 0 points 0 points 0 points  Count back from 20 0 points 0 points 0 points 0 points 0 points  Months in reverse 0 points 0 points 0 points 0 points 0 points  Repeat phrase 0 points 2 points 0 points 2 points 0 points  Total Score 0 points 2 points 0 points 2 points 0 points    Immunizations Immunization History  Administered Date(s) Administered   Moderna SARS-COV2 Booster Vaccination 07/05/2020   Moderna Sars-Covid-2 Vaccination 10/18/2019, 11/15/2019   PPD Test 12/05/2016  Pneumococcal Conjugate-13 10/31/2018   Pneumococcal Polysaccharide-23 11/21/2021   Tdap 12/06/2022   Zoster Recombinat (Shingrix) 01/26/2022, 05/25/2022    TDAP status: Up to date  Flu Vaccine status: Up to date  Pneumococcal vaccine status: Up to date  Covid-19 vaccine status: Completed vaccines  Qualifies for Shingles Vaccine? Yes    Zostavax completed Yes   Shingrix Completed?: Yes  Screening Tests Health Maintenance  Topic Date Due   COVID-19 Vaccine (3 - Moderna risk series) 08/02/2020   Medicare Annual Wellness (AWV)  01/27/2023   INFLUENZA VACCINE  04/05/2023   DEXA SCAN  12/26/2024   Colonoscopy  09/02/2029   DTaP/Tdap/Td (2 - Td or Tdap) 12/05/2032   Pneumonia Vaccine 73+ Years old  Completed   Hepatitis C Screening  Completed   Zoster Vaccines- Shingrix  Completed   HPV VACCINES  Aged Out    Health Maintenance  Health Maintenance Due  Topic Date Due   COVID-19 Vaccine (3 - Moderna risk series) 08/02/2020   Medicare Annual Wellness (AWV)  01/27/2023    Colorectal cancer screening: Type of screening: Colonoscopy. Completed 01/03/2023. Repeat every 5 years  Mammogram status: Completed 12/27/2022. Repeat every year  Bone Density status: Completed 12/27/2022.   Lung Cancer Screening: (Low Dose CT Chest recommended if Age 21-80 years, 30 pack-year currently smoking OR have quit w/in 15years.) does not qualify.   Lung Cancer Screening Referral: no  Additional Screening:  Hepatitis C Screening: does qualify; Completed 01/02/2014  Vision Screening: Recommended annual ophthalmology exams for early detection of glaucoma and other disorders of the eye. Is the patient up to date with their annual eye exam?  Yes  Who is the provider or what is the name of the office in which the patient attends annual eye exams? [facility] If pt is not established with a provider, would they like to be referred to a provider to establish care? No .   Dental Screening: Recommended annual dental exams for proper oral hygiene  Community Resource Referral / Chronic Care Management: CRR required this visit?  No   CCM required this visit?  No      Plan:     I have personally reviewed and noted the following in the patient's chart:   Medical and social history Use of alcohol, tobacco or illicit drugs  Current  medications and supplements including opioid prescriptions. Patient is not currently taking opioid prescriptions. Functional ability and status Nutritional status Physical activity Advanced directives List of other physicians Hospitalizations, surgeries, and ER visits in previous 12 months Vitals Screenings to include cognitive, depression, and falls Referrals and appointments  In addition, I have reviewed and discussed with patient certain preventive protocols, quality metrics, and best practice recommendations. A written personalized care plan for preventive services as well as general preventive health recommendations were provided to patient.     Barb Merino, LPN   09/09/1094   Nurse Notes: Due to this being a virtual visit, the after visit summary with patients personalized plan was offered to patient via mail or my-chart.  to pick up at office at next visit

## 2023-02-07 NOTE — Patient Instructions (Signed)
Katherine Summers , Thank you for taking time to come for your Medicare Wellness Visit. I appreciate your ongoing commitment to your health goals. Please review the following plan we discussed and let me know if I can assist you in the future.   These are the goals we discussed:  Goals       Exercise 150 min/wk Moderate Activity (pt-stated)      Manage My Medicine      Timeframe:  Long-Range Goal Priority:  High                                   Follow Up Date: 05/18/2021   - call for medicine refill 2 or 3 days before it runs out - keep a list of all the medicines I take; vitamins and herbals too - use a pillbox to sort medicine    Why is this important?   These steps will help you keep on track with your medicines.   Notes:  Follow up with any questions that I have about my medications       Patient Stated      11/12/2019, wants to eat healthier      Patient Stated      12/22/2020, wants to walk better      Patient Stated      01/26/2022, wants to walk without cane      Patient Stated      02/07/2023, no goals        This is a list of the screening recommended for you and due dates:  Health Maintenance  Topic Date Due   COVID-19 Vaccine (3 - Moderna risk series) 08/02/2020   Flu Shot  04/05/2023   Medicare Annual Wellness Visit  02/07/2024   DEXA scan (bone density measurement)  12/26/2024   Colon Cancer Screening  09/02/2029   DTaP/Tdap/Td vaccine (2 - Td or Tdap) 12/05/2032   Pneumonia Vaccine  Completed   Hepatitis C Screening  Completed   Zoster (Shingles) Vaccine  Completed   HPV Vaccine  Aged Out    Advanced directives: Advance directive discussed with you today.   Conditions/risks identified: none  Next appointment: Follow up in one year for your annual wellness visit    Preventive Care 65 Years and Older, Female Preventive care refers to lifestyle choices and visits with your health care provider that can promote health and wellness. What does preventive  care include? A yearly physical exam. This is also called an annual well check. Dental exams once or twice a year. Routine eye exams. Ask your health care provider how often you should have your eyes checked. Personal lifestyle choices, including: Daily care of your teeth and gums. Regular physical activity. Eating a healthy diet. Avoiding tobacco and drug use. Limiting alcohol use. Practicing safe sex. Taking low-dose aspirin every day. Taking vitamin and mineral supplements as recommended by your health care provider. What happens during an annual well check? The services and screenings done by your health care provider during your annual well check will depend on your age, overall health, lifestyle risk factors, and family history of disease. Counseling  Your health care provider may ask you questions about your: Alcohol use. Tobacco use. Drug use. Emotional well-being. Home and relationship well-being. Sexual activity. Eating habits. History of falls. Memory and ability to understand (cognition). Work and work Astronomer. Reproductive health. Screening  You may have the following tests or  measurements: Height, weight, and BMI. Blood pressure. Lipid and cholesterol levels. These may be checked every 5 years, or more frequently if you are over 49 years old. Skin check. Lung cancer screening. You may have this screening every year starting at age 30 if you have a 30-pack-year history of smoking and currently smoke or have quit within the past 15 years. Fecal occult blood test (FOBT) of the stool. You may have this test every year starting at age 48. Flexible sigmoidoscopy or colonoscopy. You may have a sigmoidoscopy every 5 years or a colonoscopy every 10 years starting at age 67. Hepatitis C blood test. Hepatitis B blood test. Sexually transmitted disease (STD) testing. Diabetes screening. This is done by checking your blood sugar (glucose) after you have not eaten for a  while (fasting). You may have this done every 1-3 years. Bone density scan. This is done to screen for osteoporosis. You may have this done starting at age 68. Mammogram. This may be done every 1-2 years. Talk to your health care provider about how often you should have regular mammograms. Talk with your health care provider about your test results, treatment options, and if necessary, the need for more tests. Vaccines  Your health care provider may recommend certain vaccines, such as: Influenza vaccine. This is recommended every year. Tetanus, diphtheria, and acellular pertussis (Tdap, Td) vaccine. You may need a Td booster every 10 years. Zoster vaccine. You may need this after age 35. Pneumococcal 13-valent conjugate (PCV13) vaccine. One dose is recommended after age 19. Pneumococcal polysaccharide (PPSV23) vaccine. One dose is recommended after age 12. Talk to your health care provider about which screenings and vaccines you need and how often you need them. This information is not intended to replace advice given to you by your health care provider. Make sure you discuss any questions you have with your health care provider. Document Released: 09/17/2015 Document Revised: 05/10/2016 Document Reviewed: 06/22/2015 Elsevier Interactive Patient Education  2017 [employer].  Fall Prevention in the Home Falls can cause injuries. They can happen to people of all ages. There are many things you can do to make your home safe and to help prevent falls. What can I do on the outside of my home? Regularly fix the edges of walkways and driveways and fix any cracks. Remove anything that might make you trip as you walk through a door, such as a raised step or threshold. Trim any bushes or trees on the path to your home. Use bright outdoor lighting. Clear any walking paths of anything that might make someone trip, such as rocks or tools. Regularly check to see if handrails are loose or broken. Make  sure that both sides of any steps have handrails. Any raised decks and porches should have guardrails on the edges. Have any leaves, snow, or ice cleared regularly. Use sand or salt on walking paths during winter. Clean up any spills in your garage right away. This includes oil or grease spills. What can I do in the bathroom? Use night lights. Install grab bars by the toilet and in the tub and shower. Do not use towel bars as grab bars. Use non-skid mats or decals in the tub or shower. If you need to sit down in the shower, use a plastic, non-slip stool. Keep the floor dry. Clean up any water that spills on the floor as soon as it happens. Remove soap buildup in the tub or shower regularly. Attach bath mats securely with double-sided non-slip  rug tape. Do not have throw rugs and other things on the floor that can make you trip. What can I do in the bedroom? Use night lights. Make sure that you have a light by your bed that is easy to reach. Do not use any sheets or blankets that are too big for your bed. They should not hang down onto the floor. Have a firm chair that has side arms. You can use this for support while you get dressed. Do not have throw rugs and other things on the floor that can make you trip. What can I do in the kitchen? Clean up any spills right away. Avoid walking on wet floors. Keep items that you use a lot in easy-to-reach places. If you need to reach something above you, use a strong step stool that has a grab bar. Keep electrical cords out of the way. Do not use floor polish or wax that makes floors slippery. If you must use wax, use non-skid floor wax. Do not have throw rugs and other things on the floor that can make you trip. What can I do with my stairs? Do not leave any items on the stairs. Make sure that there are handrails on both sides of the stairs and use them. Fix handrails that are broken or loose. Make sure that handrails are as long as the  stairways. Check any carpeting to make sure that it is firmly attached to the stairs. Fix any carpet that is loose or worn. Avoid having throw rugs at the top or bottom of the stairs. If you do have throw rugs, attach them to the floor with carpet tape. Make sure that you have a light switch at the top of the stairs and the bottom of the stairs. If you do not have them, ask someone to add them for you. What else can I do to help prevent falls? Wear shoes that: Do not have high heels. Have rubber bottoms. Are comfortable and fit you well. Are closed at the toe. Do not wear sandals. If you use a stepladder: Make sure that it is fully opened. Do not climb a closed stepladder. Make sure that both sides of the stepladder are locked into place. Ask someone to hold it for you, if possible. Clearly mark and make sure that you can see: Any grab bars or handrails. First and last steps. Where the edge of each step is. Use tools that help you move around (mobility aids) if they are needed. These include: Canes. Walkers. Scooters. Crutches. Turn on the lights when you go into a dark area. Replace any light bulbs as soon as they burn out. Set up your furniture so you have a clear path. Avoid moving your furniture around. If any of your floors are uneven, fix them. If there are any pets around you, be aware of where they are. Review your medicines with your doctor. Some medicines can make you feel dizzy. This can increase your chance of falling. Ask your doctor what other things that you can do to help prevent falls. This information is not intended to replace advice given to you by your health care provider. Make sure you discuss any questions you have with your health care provider. Document Released: 06/17/2009 Document Revised: 01/27/2016 Document Reviewed: 09/25/2014 Elsevier Interactive Patient Education  2017 [employer].

## 2023-02-09 ENCOUNTER — Other Ambulatory Visit: Payer: Self-pay | Admitting: Internal Medicine

## 2023-03-28 ENCOUNTER — Ambulatory Visit: Payer: Medicare Other | Admitting: Internal Medicine

## 2023-04-10 ENCOUNTER — Ambulatory Visit: Payer: Medicare Other | Admitting: Internal Medicine

## 2023-04-11 ENCOUNTER — Encounter: Payer: Self-pay | Admitting: Internal Medicine

## 2023-04-11 ENCOUNTER — Ambulatory Visit (INDEPENDENT_AMBULATORY_CARE_PROVIDER_SITE_OTHER): Payer: Medicare Other | Admitting: Internal Medicine

## 2023-04-11 ENCOUNTER — Telehealth: Payer: Self-pay | Admitting: *Deleted

## 2023-04-11 ENCOUNTER — Other Ambulatory Visit: Payer: Self-pay | Admitting: Internal Medicine

## 2023-04-11 VITALS — BP 132/84 | HR 78 | Temp 97.9°F | Ht 65.0 in | Wt 175.0 lb

## 2023-04-11 DIAGNOSIS — R7309 Other abnormal glucose: Secondary | ICD-10-CM

## 2023-04-11 DIAGNOSIS — I10 Essential (primary) hypertension: Secondary | ICD-10-CM

## 2023-04-11 DIAGNOSIS — E7800 Pure hypercholesterolemia, unspecified: Secondary | ICD-10-CM | POA: Diagnosis not present

## 2023-04-11 DIAGNOSIS — F3341 Major depressive disorder, recurrent, in partial remission: Secondary | ICD-10-CM | POA: Diagnosis not present

## 2023-04-11 DIAGNOSIS — Z6829 Body mass index (BMI) 29.0-29.9, adult: Secondary | ICD-10-CM

## 2023-04-11 DIAGNOSIS — G720 Drug-induced myopathy: Secondary | ICD-10-CM

## 2023-04-11 DIAGNOSIS — T466X5A Adverse effect of antihyperlipidemic and antiarteriosclerotic drugs, initial encounter: Secondary | ICD-10-CM

## 2023-04-11 DIAGNOSIS — E663 Overweight: Secondary | ICD-10-CM

## 2023-04-11 DIAGNOSIS — J449 Chronic obstructive pulmonary disease, unspecified: Secondary | ICD-10-CM

## 2023-04-11 LAB — CMP14+EGFR
ALT: 9 IU/L (ref 0–32)
AST: 17 IU/L (ref 0–40)
Albumin: 4.5 g/dL (ref 3.8–4.8)
Alkaline Phosphatase: 111 IU/L (ref 44–121)
BUN/Creatinine Ratio: 16 (ref 12–28)
BUN: 15 mg/dL (ref 8–27)
Bilirubin Total: 0.5 mg/dL (ref 0.0–1.2)
CO2: 24 mmol/L (ref 20–29)
Calcium: 10.8 mg/dL — ABNORMAL HIGH (ref 8.7–10.3)
Chloride: 102 mmol/L (ref 96–106)
Creatinine, Ser: 0.96 mg/dL (ref 0.57–1.00)
Globulin, Total: 2.9 g/dL (ref 1.5–4.5)
Glucose: 85 mg/dL (ref 70–99)
Potassium: 4.8 mmol/L (ref 3.5–5.2)
Sodium: 141 mmol/L (ref 134–144)
Total Protein: 7.4 g/dL (ref 6.0–8.5)
eGFR: 62 mL/min/{1.73_m2} (ref >59)

## 2023-04-11 LAB — CBC
Hematocrit: 39.7 % (ref 34.0–46.6)
Hemoglobin: 13.1 g/dL (ref 11.1–15.9)
MCH: 28.9 pg (ref 26.6–33.0)
MCHC: 33 g/dL (ref 31.5–35.7)
MCV: 88 fL (ref 79–97)
Platelets: 272 10*3/uL (ref 150–450)
RBC: 4.53 x10E6/uL (ref 3.77–5.28)
RDW: 13.2 % (ref 11.7–15.4)
WBC: 8.2 10*3/uL (ref 3.4–10.8)

## 2023-04-11 LAB — LIPID PANEL
Chol/HDL Ratio: 3.5 ratio (ref 0.0–4.4)
Cholesterol, Total: 208 mg/dL — ABNORMAL HIGH (ref 100–199)
HDL: 60 mg/dL (ref >39)
LDL Chol Calc (NIH): 128 mg/dL — ABNORMAL HIGH (ref 0–99)
Triglycerides: 112 mg/dL (ref 0–149)
VLDL Cholesterol Cal: 20 mg/dL (ref 5–40)

## 2023-04-11 LAB — TSH: TSH: 1.55 u[IU]/mL (ref 0.450–4.500)

## 2023-04-11 LAB — HEMOGLOBIN A1C
Est. average glucose Bld gHb Est-mCnc: 114 mg/dL
Hgb A1c MFr Bld: 5.6 % (ref 4.8–5.6)

## 2023-04-11 NOTE — Assessment & Plan Note (Signed)
Previous labs reviewed, her A1c has been elevated in the past. I will check an A1c today. Reminded to avoid refined sugars including sugary drinks/foods and processed meats including bacon, sausages and deli meats.  

## 2023-04-11 NOTE — Assessment & Plan Note (Addendum)
Chronic, fair control. She will continue with amlodipine 5mg  and lisinopril/hct 20/12.5mg  daily. Reminded to follow a low sodium diet. She will f/u in four months. She is aware goal BP is less than 130/80.

## 2023-04-11 NOTE — Assessment & Plan Note (Signed)
Chronic, she states she has had no worsening of sx. Last Pulmonary eval was in Nov 2023.

## 2023-04-11 NOTE — Assessment & Plan Note (Addendum)
Chronic, PHQ-9 performed. She agrees to therapy. Referral placed. If sx persist, will consider changing from escitalopram to sertraline.

## 2023-04-11 NOTE — Progress Notes (Signed)
  Care Coordination  Outreach Note  04/11/2023 Name: Katherine Summers MRN: [id number] DOB: 09-08-1945   Care Coordination Outreach Attempts: An unsuccessful telephone outreach was attempted today to offer the patient information about available care coordination services.  Follow Up Plan:  Additional outreach attempts will be made to offer the patient care coordination information and services.   Encounter Outcome:  No Answer  Christie Nottingham  Care Coordination Care Guide  Direct Dial: [phone]

## 2023-04-11 NOTE — Progress Notes (Signed)
I,Victoria T Deloria Lair, CMA,acting as a Neurosurgeon for Gwynneth Aliment, MD.,have documented all relevant documentation on the behalf of Gwynneth Aliment, MD,as directed by  Gwynneth Aliment, MD while in the presence of Gwynneth Aliment, MD.  Subjective:  Patient ID: Katherine Summers , female    DOB: [birth year] , 77 y.o.   MRN: [id number]  Chief Complaint  Patient presents with   Hypertension    HPI  She is here today for a bp and chol check. Patient reports compliance with medications. However, admits she is not taking her cholesterol meds.  Denies headache, chest pain, and SOB. She feels sad because her children don't spend a lot of time with her.   Hypertension This is a chronic problem. The current episode started more than 1 year ago. The problem has been gradually improving since onset. The problem is controlled. Pertinent negatives include no blurred vision. Risk factors for coronary artery disease include dyslipidemia, sedentary lifestyle and post-menopausal state. Past treatments include ACE inhibitors, diuretics and calcium channel blockers. The current treatment provides moderate improvement. Compliance problems include exercise.      Past Medical History:  Diagnosis Date   Allergy    CKD (chronic kidney disease) stage 2, GFR 60-89 ml/min    COPD (chronic obstructive pulmonary disease) (HCC)    Depression 08/01/2016   Dizziness and giddiness    Essential hypertension 08/01/2016   Hyperlipidemia    Hypertension    Murmur 08/01/2016   Pre-diabetes    Shortness of breath 08/01/2016   Vitamin D deficiency      Family History  Problem Relation Age of Onset   Stroke Mother    Colon cancer Father    Colon cancer Sister    Stroke Maternal Grandmother    Colon cancer Paternal Grandfather      Current Outpatient Medications:    albuterol (VENTOLIN HFA) 108 (90 Base) MCG/ACT inhaler, Inhale 2 puffs into the lungs every 6 (six) hours as needed for wheezing or shortness of breath.,  Disp: 8 g, Rfl: 6   escitalopram (LEXAPRO) 10 MG tablet, TAKE 1 TABLET(10 MG) BY MOUTH DAILY, Disp: 90 tablet, Rfl: 1   Fluticasone-Umeclidin-Vilant (TRELEGY ELLIPTA) 100-62.5-25 MCG/ACT AEPB, Inhale 1 puff into the lungs daily., Disp: 28 each, Rfl: 11   ipratropium-albuterol (DUONEB) 0.5-2.5 (3) MG/3ML SOLN, Take 3 mLs by nebulization every 6 (six) hours as needed (shortness of breath, cough or wheezing)., Disp: 360 mL, Rfl: 6   lisinopril-hydrochlorothiazide (ZESTORETIC) 20-12.5 MG tablet, TAKE 1 TABLET BY MOUTH DAILY, Disp: 90 tablet, Rfl: 2   VITAMIN D PO, Take 5,000 mg by mouth. Take 1 daily mon through fri, Disp: , Rfl:    amLODipine (NORVASC) 5 MG tablet, TAKE 1 TABLET(5 MG) BY MOUTH DAILY, Disp: 90 tablet, Rfl: 2   No Known Allergies   Review of Systems  Constitutional: Negative.   Eyes: Negative.  Negative for blurred vision.  Respiratory: Negative.    Cardiovascular: Negative.   Gastrointestinal: Negative.   Endocrine: Negative for polydipsia, polyphagia and polyuria.  Skin: Negative.   Neurological: Negative.   Psychiatric/Behavioral: Negative.       Today's Vitals   04/11/23 0850  BP: 132/84  Pulse: 78  Temp: 97.9 F (36.6 C)  Weight: 175 lb (79.4 kg)  Height: 5\' 5"  (1.651 m)  PainSc: 2   PainLoc: Foot   Body mass index is 29.12 kg/m.  Wt Readings from Last 3 Encounters:  04/11/23 175 lb (79.4 kg)  02/07/23 170  lb (77.1 kg)  12/06/22 178 lb 3.2 oz (80.8 kg)     Objective:  Physical Exam Vitals and nursing note reviewed.  Constitutional:      Appearance: Normal appearance.  HENT:     Head: Normocephalic and atraumatic.  Eyes:     Extraocular Movements: Extraocular movements intact.  Cardiovascular:     Rate and Rhythm: Normal rate and regular rhythm.     Heart sounds: Normal heart sounds.  Pulmonary:     Effort: Pulmonary effort is normal.     Breath sounds: Normal breath sounds.  Musculoskeletal:     Cervical back: Normal range of motion.      Right lower leg: Edema present.     Left lower leg: Edema present.     Comments: Ambulatory with cane  Skin:    General: Skin is warm.  Neurological:     General: No focal deficit present.     Mental Status: She is alert.  Psychiatric:        Mood and Affect: Mood normal.        Behavior: Behavior normal.         Assessment And Plan:  Essential hypertension Assessment & Plan: Chronic, fair control. She will continue with amlodipine 5mg  and lisinopril/hct 20/12.5mg  daily. Reminded to follow a low sodium diet. She will f/u in four months. She is aware goal BP is less than 130/80.   Orders: -     CBC -     Lipid panel -     CMP14+EGFR -     TSH  Pure hypercholesterolemia Assessment & Plan: Chronic, intolerant of two statins. Does not wish to try a new medication at this time.   Orders: -     Lipid panel -     CMP14+EGFR -     TSH  Other abnormal glucose Assessment & Plan: Previous labs reviewed, her A1c has been elevated in the past. I will check an A1c today. Reminded to avoid refined sugars including sugary drinks/foods and processed meats including bacon, sausages and deli meats.    Orders: -     Hemoglobin A1c  Recurrent major depressive disorder, in partial remission [facility]) Assessment & Plan: Chronic, PHQ-9 performed. She agrees to therapy. Referral placed. If sx persist, will consider changing from escitalopram to sertraline.   Orders: -     Ambulatory referral to Psychology  Chronic obstructive pulmonary disease, unspecified COPD type (HCC) Assessment & Plan: Chronic, she states she has had no worsening of sx. Last Pulmonary eval was in Nov 2023.    Statin myopathy Assessment & Plan: She has been intolerant of statins. She does not wish to try another chol medication at this time.   Overweight with body mass index (BMI) of 29 to 29.9 in adult Assessment & Plan: She is encouraged to perform chair exercises while watching TV.       Return in about  4 months (around 08/11/2023), or bp check.  Patient was given opportunity to ask questions. Patient verbalized understanding of the plan and was able to repeat key elements of the plan. All questions were answered to their satisfaction.    I, Gwynneth Aliment, MD, have reviewed all documentation for this visit. The documentation on 04/11/23 for the exam, diagnosis, procedures, and orders are all accurate and complete.   IF YOU HAVE BEEN REFERRED TO A SPECIALIST, IT MAY TAKE 1-2 WEEKS TO SCHEDULE/PROCESS THE REFERRAL. IF YOU HAVE NOT HEARD FROM US/SPECIALIST IN TWO WEEKS, PLEASE  GIVE [place] A CALL AT [phone].   THE PATIENT IS ENCOURAGED TO PRACTICE SOCIAL DISTANCING DUE TO THE COVID-19 PANDEMIC.

## 2023-04-11 NOTE — Patient Instructions (Signed)
Hypertension, Adult Hypertension is another name for high blood pressure. High blood pressure forces your heart to work harder to pump blood. This can cause problems over time. There are two numbers in a blood pressure reading. There is a top number (systolic) over a bottom number (diastolic). It is best to have a blood pressure that is below 120/80. What are the causes? The cause of this condition is not known. Some other conditions can lead to high blood pressure. What increases the risk? Some lifestyle factors can make you more likely to develop high blood pressure: Smoking. Not getting enough exercise or physical activity. Being overweight. Having too much fat, sugar, calories, or salt (sodium) in your diet. Drinking too much alcohol. Other risk factors include: Having any of these conditions: Heart disease. Diabetes. High cholesterol. Kidney disease. Obstructive sleep apnea. Having a family history of high blood pressure and high cholesterol. Age. The risk increases with age. Stress. What are the signs or symptoms? High blood pressure may not cause symptoms. Very high blood pressure (hypertensive crisis) may cause: Headache. Fast or uneven heartbeats (palpitations). Shortness of breath. Nosebleed. Vomiting or feeling like you may vomit (nauseous). Changes in how you see. Very bad chest pain. Feeling dizzy. Seizures. How is this treated? This condition is treated by making healthy lifestyle changes, such as: Eating healthy foods. Exercising more. Drinking less alcohol. Your doctor may prescribe medicine if lifestyle changes do not help enough and if: Your top number is above 130. Your bottom number is above 80. Your personal target blood pressure may vary. Follow these instructions at home: Eating and drinking  If told, follow the DASH eating plan. To follow this plan: Fill one half of your plate at each meal with fruits and vegetables. Fill one fourth of your plate  at each meal with whole grains. Whole grains include whole-wheat pasta, brown rice, and whole-grain bread. Eat or drink low-fat dairy products, such as skim milk or low-fat yogurt. Fill one fourth of your plate at each meal with low-fat (lean) proteins. Low-fat proteins include fish, chicken without skin, eggs, beans, and tofu. Avoid fatty meat, cured and processed meat, or chicken with skin. Avoid pre-made or processed food. Limit the amount of salt in your diet to less than 1,500 mg each day. Do not drink alcohol if: Your doctor tells you not to drink. You are pregnant, may be pregnant, or are planning to become pregnant. If you drink alcohol: Limit how much you have to: 0-1 drink a day for women. 0-2 drinks a day for men. Know how much alcohol is in your drink. In the [place] one drink equals one 12 oz bottle of beer (355 mL), one 5 oz glass of wine (148 mL), or one 1 oz glass of hard liquor (44 mL). Lifestyle  Work with your doctor to stay at a healthy weight or to lose weight. Ask your doctor what the best weight is for you. Get at least 30 minutes of exercise that causes your heart to beat faster (aerobic exercise) most days of the week. This may include walking, swimming, or biking. Get at least 30 minutes of exercise that strengthens your muscles (resistance exercise) at least 3 days a week. This may include lifting weights or doing Pilates. Do not smoke or use any products that contain nicotine or tobacco. If you need help quitting, ask your doctor. Check your blood pressure at home as told by your doctor. Keep all follow-up visits. Medicines Take over-the-counter and prescription medicines   only as told by your doctor. Follow directions carefully. Do not skip doses of blood pressure medicine. The medicine does not work as well if you skip doses. Skipping doses also puts you at risk for problems. Ask your doctor about side effects or reactions to medicines that you should watch  for. Contact a doctor if: You think you are having a reaction to the medicine you are taking. You have headaches that keep coming back. You feel dizzy. You have swelling in your ankles. You have trouble with your vision. Get help right away if: You get a very bad headache. You start to feel mixed up (confused). You feel weak or numb. You feel faint. You have very bad pain in your: Chest. Belly (abdomen). You vomit more than once. You have trouble breathing. These symptoms may be an emergency. Get help right away. Call 911. Do not wait to see if the symptoms will go away. Do not drive yourself to the hospital. Summary Hypertension is another name for high blood pressure. High blood pressure forces your heart to work harder to pump blood. For most people, a normal blood pressure is less than 120/80. Making healthy choices can help lower blood pressure. If your blood pressure does not get lower with healthy choices, you may need to take medicine. This information is not intended to replace advice given to you by your health care provider. Make sure you discuss any questions you have with your health care provider. Document Revised: 06/09/2021 Document Reviewed: 06/09/2021 Elsevier Patient Education  2024 [employer]  

## 2023-04-11 NOTE — Assessment & Plan Note (Signed)
She is encouraged to perform chair exercises while watching TV.

## 2023-04-11 NOTE — Assessment & Plan Note (Signed)
Chronic, intolerant of two statins. Does not wish to try a new medication at this time.

## 2023-04-11 NOTE — Assessment & Plan Note (Signed)
She has been intolerant of statins. She does not wish to try another chol medication at this time.

## 2023-04-13 NOTE — Progress Notes (Signed)
  Care Coordination   Note   04/13/2023 Name: YARIZA KREIGER MRN: [id number] DOB: 1946-02-28  TRAMANH STJAMES is a 77 y.o. year old female who sees Dorothyann Peng, MD for primary care. I reached out to Hershal Coria by phone today to offer care coordination services.  Ms. Janis was given information about Care Coordination services today including:   The Care Coordination services include support from the care team which includes your Nurse Coordinator, Clinical Social Worker, or Pharmacist.  The Care Coordination team is here to help remove barriers to the health concerns and goals most important to you. Care Coordination services are voluntary, and the patient may decline or stop services at any time by request to their care team member.   Care Coordination Consent Status: Patient agreed to services and verbal consent obtained.   Follow up plan:  Telephone appointment with care coordination team member scheduled for:  04/17/23  Encounter Outcome:  Pt. Scheduled  [facility] Coordination Care Guide  Direct Dial: [phone]

## 2023-04-17 ENCOUNTER — Ambulatory Visit: Payer: Self-pay | Admitting: Licensed Clinical Social Worker

## 2023-04-20 NOTE — Patient Instructions (Signed)
Visit Information  Thank you for taking time to visit with me today. Please don't hesitate to contact me if I can be of assistance to you.   Following are the goals we discussed today:   Goals Addressed             This Visit's Progress    Obtain Supportive Resources-MH Management   On track    Activities and task to complete in order to accomplish goals.   Keep all upcoming appointments discussed today Continue with compliance of taking medication prescribed by Doctor Implement healthy coping skills discussed to assist with management of symptoms         Our next appointment is by telephone on 09/04 at 11 AM  Please call the care guide team at [phone] if you need to cancel or reschedule your appointment.   If you are experiencing a Mental Health or Behavioral Health Crisis or need someone to talk to, please call the Suicide and Crisis Lifeline: 988 call 911   The patient verbalized understanding of instructions, educational materials, and care plan provided today and DECLINED offer to receive copy of patient instructions, educational materials, and care plan.   Jenel Lucks, MSW, LCSW [facility] Care Management [place]  [facility] [email or web address] Phone [phone] 5:45 AM

## 2023-04-20 NOTE — Patient Outreach (Signed)
  Care Coordination   Initial Visit Note   04/17/2023 Name: Katherine Summers MRN: [id number] DOB: 1946-01-19  TIONA CLIVE is a 77 y.o. year old female who sees Dorothyann Peng, MD for primary care. I spoke with  Hershal Coria by phone today.  What matters to the patients health and wellness today?  Symptom Management    Goals Addressed             This Visit's Progress    Obtain Supportive Resources-MH Management   On track    Activities and task to complete in order to accomplish goals.   Keep all upcoming appointments discussed today Continue with compliance of taking medication prescribed by Doctor Implement healthy coping skills discussed to assist with management of symptoms         SDOH assessments and interventions completed:  No     Care Coordination Interventions:  Yes, provided  Interventions Today    Flowsheet Row Most Recent Value  Chronic Disease   Chronic disease during today's visit Hypertension (HTN), Chronic Obstructive Pulmonary Disease (COPD), Other  [Depression]  General Interventions   General Interventions Discussed/Reviewed General Interventions Discussed, Doctor Visits  Doctor Visits Discussed/Reviewed Doctor Visits Reviewed  Exercise Interventions   Exercise Discussed/Reviewed Exercise Discussed  Mental Health Interventions   Mental Health Discussed/Reviewed Mental Health Discussed, Coping Strategies, Anxiety, Depression  Nutrition Interventions   Nutrition Discussed/Reviewed Nutrition Discussed  Pharmacy Interventions   Pharmacy Dicussed/Reviewed Pharmacy Topics Discussed, Medication Adherence  Safety Interventions   Safety Discussed/Reviewed Safety Discussed       Follow up plan: Follow up call scheduled for 2-4 weeks    Encounter Outcome:  Pt. Visit Completed   Jenel Lucks, MSW, LCSW [facility] Care Management [facility] Health  [facility] [email or web address] Phone [phone] 5:44 AM

## 2023-05-09 ENCOUNTER — Encounter: Payer: Self-pay | Admitting: Licensed Clinical Social Worker

## 2023-05-10 ENCOUNTER — Other Ambulatory Visit: Payer: Self-pay | Admitting: Internal Medicine

## 2023-05-10 ENCOUNTER — Telehealth: Payer: Self-pay | Admitting: Licensed Clinical Social Worker

## 2023-05-10 NOTE — Patient Outreach (Signed)
  Care Coordination   05/09/2023 Name: Katherine Summers MRN: [id number] DOB: 1946-05-13   Care Coordination Outreach Attempts:  An unsuccessful telephone outreach was attempted for a scheduled appointment today.  Follow Up Plan:  Additional outreach attempts will be made to offer the patient care coordination information and services.   Encounter Outcome:  No Answer   Care Coordination Interventions:  No, not indicated    Jenel Lucks, MSW, LCSW [facility] Care Management [place]  [facility] [email or web address] Phone [phone] 11:01 PM

## 2023-05-28 ENCOUNTER — Other Ambulatory Visit: Payer: Self-pay | Admitting: Internal Medicine

## 2023-05-28 ENCOUNTER — Telehealth: Payer: Self-pay

## 2023-05-28 DIAGNOSIS — E7800 Pure hypercholesterolemia, unspecified: Secondary | ICD-10-CM

## 2023-05-28 MED ORDER — ROSUVASTATIN CALCIUM 10 MG PO TABS
10.0000 mg | ORAL_TABLET | Freq: Every day | ORAL | 1 refills | Status: DC
Start: 2023-05-28 — End: 2023-05-28

## 2023-07-30 ENCOUNTER — Ambulatory Visit: Payer: Medicare Other | Admitting: Internal Medicine

## 2023-07-30 ENCOUNTER — Telehealth: Payer: Self-pay

## 2023-07-30 ENCOUNTER — Encounter: Payer: Self-pay | Admitting: Internal Medicine

## 2023-07-30 ENCOUNTER — Other Ambulatory Visit: Payer: Self-pay | Admitting: Pharmacist

## 2023-07-30 VITALS — BP 122/70 | HR 86 | Temp 98.6°F | Ht 65.0 in | Wt 176.2 lb

## 2023-07-30 DIAGNOSIS — I10 Essential (primary) hypertension: Secondary | ICD-10-CM

## 2023-07-30 DIAGNOSIS — E7800 Pure hypercholesterolemia, unspecified: Secondary | ICD-10-CM

## 2023-07-30 DIAGNOSIS — J449 Chronic obstructive pulmonary disease, unspecified: Secondary | ICD-10-CM

## 2023-07-30 DIAGNOSIS — Z2821 Immunization not carried out because of patient refusal: Secondary | ICD-10-CM

## 2023-07-30 DIAGNOSIS — F3341 Major depressive disorder, recurrent, in partial remission: Secondary | ICD-10-CM | POA: Diagnosis not present

## 2023-07-30 MED ORDER — BREZTRI AEROSPHERE 160-9-4.8 MCG/ACT IN AERO
2.0000 | INHALATION_SPRAY | Freq: Two times a day (BID) | RESPIRATORY_TRACT | 3 refills | Status: DC
Start: 2023-07-30 — End: 2024-01-25

## 2023-07-30 NOTE — Assessment & Plan Note (Signed)
Chronic, controlled. She will continue with amlodipine 5mg  and lisinopril/hct 20/12.5mg  daily. Reminded to follow a low sodium diet.

## 2023-07-30 NOTE — Patient Instructions (Signed)

## 2023-07-30 NOTE — Progress Notes (Signed)
I,Katherine Summers, CMA,acting as a Neurosurgeon for Katherine Aliment, MD.,have documented all relevant documentation on the behalf of Katherine Aliment, MD,as directed by  Katherine Aliment, MD while in the presence of Katherine Aliment, MD.  Subjective:  Patient ID: Katherine Summers , female    DOB: Aug 24, 1946 , 77 y.o.   MRN: [id number]  Chief Complaint  Patient presents with   Hyperlipidemia   Hypertension    HPI  Patient presents today for cholesterol & bp follow up. She was switched to rosuvastatin at her last visit because the atorvastati caused leg pain. She states she never started the rosuvastatin because she did not want to waste the atorva. She states the leg pain hasn't been as bad recently.  She is now taking atorvastatin MWF.   "I was being cheap, and I had too many pills left so I kept taking the Atorvastatin".    She denies headaches, chest pain and shortness of breath.    Hypertension This is a chronic problem. The current episode started more than 1 year ago. The problem has been gradually improving since onset. The problem is controlled. Pertinent negatives include no blurred vision. The current treatment provides moderate improvement. Compliance problems include exercise.   Hyperlipidemia Associated symptoms include myalgias.     Past Medical History:  Diagnosis Date   Allergy    CKD (chronic kidney disease) stage 2, GFR 60-89 ml/min    COPD (chronic obstructive pulmonary disease) (HCC)    Depression 08/01/2016   Dizziness and giddiness    Essential hypertension 08/01/2016   Hyperlipidemia    Hypertension    Murmur 08/01/2016   Pre-diabetes    Shortness of breath 08/01/2016   Vitamin D deficiency      Family History  Problem Relation Age of Onset   Stroke Mother    Colon cancer Father    Colon cancer Sister    Stroke Maternal Grandmother    Colon cancer Paternal Grandfather      Current Outpatient Medications:    albuterol (VENTOLIN HFA) 108 (90 Base)  MCG/ACT inhaler, Inhale 2 puffs into the lungs every 6 (six) hours as needed for wheezing or shortness of breath., Disp: 8 g, Rfl: 6   amLODipine (NORVASC) 5 MG tablet, TAKE 1 TABLET(5 MG) BY MOUTH DAILY, Disp: 90 tablet, Rfl: 2   escitalopram (LEXAPRO) 10 MG tablet, TAKE 1 TABLET(10 MG) BY MOUTH DAILY, Disp: 90 tablet, Rfl: 2   Fluticasone-Umeclidin-Vilant (TRELEGY ELLIPTA) 100-62.5-25 MCG/ACT AEPB, Inhale 1 puff into the lungs daily., Disp: 28 each, Rfl: 11   ipratropium-albuterol (DUONEB) 0.5-2.5 (3) MG/3ML SOLN, Take 3 mLs by nebulization every 6 (six) hours as needed (shortness of breath, cough or wheezing)., Disp: 360 mL, Rfl: 6   lisinopril-hydrochlorothiazide (ZESTORETIC) 20-12.5 MG tablet, TAKE 1 TABLET BY MOUTH DAILY, Disp: 90 tablet, Rfl: 2   VITAMIN D PO, Take 5,000 mg by mouth. Take 1 daily mon through fri, Disp: , Rfl:    rosuvastatin (CRESTOR) 10 MG tablet, TAKE 1 TABLET(10 MG) BY MOUTH DAILY (Patient not taking: Reported on 07/30/2023), Disp: 90 tablet, Rfl: 2   No Known Allergies   Review of Systems  Constitutional: Negative.   Eyes:  Negative for blurred vision.  Respiratory: Negative.    Cardiovascular: Negative.   Gastrointestinal: Negative.   Musculoskeletal:  Positive for myalgias.  Neurological: Negative.   Psychiatric/Behavioral: Negative.       Today's Vitals   07/30/23 0843  BP: 122/70  Pulse: 86  Temp: 98.6 F (37 C)  SpO2: 98%  Weight: 176 lb 3.2 oz (79.9 kg)  Height: 5\' 5"  (1.651 m)   Body mass index is 29.32 kg/m.  Wt Readings from Last 3 Encounters:  07/30/23 176 lb 3.2 oz (79.9 kg)  04/11/23 175 lb (79.4 kg)  02/07/23 170 lb (77.1 kg)     Objective:  Physical Exam Vitals and nursing note reviewed.  Constitutional:      Appearance: Normal appearance.  HENT:     Head: Normocephalic and atraumatic.  Eyes:     Extraocular Movements: Extraocular movements intact.  Cardiovascular:     Rate and Rhythm: Normal rate and regular rhythm.      Heart sounds: Normal heart sounds.  Pulmonary:     Effort: Pulmonary effort is normal.     Breath sounds: Normal breath sounds.  Musculoskeletal:     Cervical back: Normal range of motion.     Comments: Ambulatory with cane  Skin:    General: Skin is warm.  Neurological:     General: No focal deficit present.     Mental Status: She is alert.  Psychiatric:        Mood and Affect: Mood normal.        Behavior: Behavior normal.         Assessment And Plan:  Pure hypercholesterolemia Assessment & Plan: Chronic, she states she is now taking atorvastatin 20mg  MWF. I will recheck lipid panel today. She will rto in April 2025.    Orders: -     Lipid panel -     ALT -     AMB Referral VBCI Care Management  Essential hypertension Assessment & Plan: Chronic, controlled. She will continue with amlodipine 5mg  and lisinopril/hct 20/12.5mg  daily. Reminded to follow a low sodium diet.   Orders: -     BMP8+eGFR -     AMB Referral VBCI Care Management  Recurrent major depressive disorder, in partial remission [facility]) Assessment & Plan: Chronic, was referred to therapy at her last visit. She states they never called, admits she unplugged her home phone due to spam calls. If sx persist, will consider changing from escitalopram to sertraline.    Hypercalcemia -     BMP8+eGFR  Chronic obstructive pulmonary disease, unspecified COPD type (HCC) Assessment & Plan: Chronic, she states she has had no worsening of sx. Last Pulmonary eval was in Nov 2023 unfortunately. Initially started on Trelegy by Pulmonary. Will give her sample of Breztri and work with CCM pharmacist for patient assistance.  Orders: -     AMB Referral VBCI Care Management  Immunization declined Assessment & Plan: Patient declined influenza vaccination at this time. Patient is aware that influenza vaccine prevents illness in 70% of healthy people, and reduces hospitalizations to 30-70% in elderly. This vaccine is  recommended annually. Education has been provided regarding the importance of this vaccine but patient still declined. Advised may receive this vaccine at local pharmacy or Health Dept.or vaccine clinic. Aware to provide a copy of the vaccination record if obtained from local pharmacy or Health Dept.  Pt is willing to accept risk associated with refusing vaccination.       Return for cancel 12/16 apt. keep april. .  Patient was given opportunity to ask questions. Patient verbalized understanding of the plan and was able to repeat key elements of the plan. All questions were answered to their satisfaction.    I, Katherine Aliment, MD, have reviewed all documentation for this visit.  The documentation on 07/30/23 for the exam, diagnosis, procedures, and orders are all accurate and complete.   IF YOU HAVE BEEN REFERRED TO A SPECIALIST, IT MAY TAKE 1-2 WEEKS TO SCHEDULE/PROCESS THE REFERRAL. IF YOU HAVE NOT HEARD FROM US/SPECIALIST IN TWO WEEKS, PLEASE GIVE [place] A CALL AT [phone].   THE PATIENT IS ENCOURAGED TO PRACTICE SOCIAL DISTANCING DUE TO THE COVID-19 PANDEMIC.

## 2023-07-30 NOTE — Assessment & Plan Note (Signed)
Chronic, was referred to therapy at her last visit. She states they never called, admits she unplugged her home phone due to spam calls. If sx persist, will consider changing from escitalopram to sertraline.

## 2023-07-30 NOTE — Progress Notes (Signed)
Pharmacy Medication Assistance Program Note    07/30/2023  Patient ID: Katherine Summers, female   DOB: Jan 27, 1946, 77 y.o.   MRN: [id number]     07/30/2023  Outreach Medication One  Initial Outreach Date (Medication One) 07/30/2023  Manufacturer Medication One [employer]  [employer] Bretztri  Type of Assistance Manufacturer Assistance  Date Application Sent to Patient 07/30/2023  Application Items Requested Application  Date Application Sent to Prescriber 07/30/2023  Name of Prescriber Dorothyann Peng  Date Application Received From Patient 07/30/2023  Application Items Received From Patient Application  Date Application Received From Provider 07/30/2023  Date Application Submitted to Manufacturer 07/30/2023  Method Application Sent to Manufacturer Fax     Switching from Trelegy to Simms due to access to patient assistance for 2025.   Catie Eppie Gibson, PharmD, BCACP, CPP Clinical Pharmacist [facility] [phone]

## 2023-07-30 NOTE — Assessment & Plan Note (Addendum)
Chronic, she states she has had no worsening of sx. Last Pulmonary eval was in Nov 2023 unfortunately. Initially started on Trelegy by Pulmonary. Will give her sample of Breztri and work with CCM pharmacist for patient assistance.

## 2023-07-30 NOTE — Assessment & Plan Note (Addendum)
Chronic, she states she is now taking atorvastatin 20mg  MWF. I will recheck lipid panel today. She will rto in April 2025.

## 2023-07-30 NOTE — Assessment & Plan Note (Signed)

## 2023-07-30 NOTE — Progress Notes (Signed)
   Care Guide Note  07/30/2023 Name: Katherine Summers MRN: [id number] DOB: 11/30/1945  Referred by: Dorothyann Peng, MD Reason for referral : Care Coordination (Outreach to schedule with pharm d )   Katherine Summers is a 77 y.o. year old female who is a primary care patient of Dorothyann Peng, MD. Katherine Summers was referred to the pharmacist for assistance related to HTN and COPD.    Successful contact was made with the patient to discuss pharmacy services including being ready for the pharmacist to call at least 5 minutes before the scheduled appointment time, to have medication bottles and any blood sugar or blood pressure readings ready for review. The patient agreed to meet with the pharmacist via with the pharmacist via telephone visit on (date/time).  08/06/2023  Penne Lash , RMA     [place]  [facility], [facility] Guide  Direct Dial: [phone]  Website: [email or web address]

## 2023-07-31 ENCOUNTER — Telehealth: Payer: Self-pay

## 2023-07-31 LAB — BMP8+EGFR
BUN/Creatinine Ratio: 18 (ref 12–28)
BUN: 17 mg/dL (ref 8–27)
CO2: 24 mmol/L (ref 20–29)
Calcium: 10.3 mg/dL (ref 8.7–10.3)
Chloride: 102 mmol/L (ref 96–106)
Creatinine, Ser: 0.96 mg/dL (ref 0.57–1.00)
Glucose: 88 mg/dL (ref 70–99)
Potassium: 3.6 mmol/L (ref 3.5–5.2)
Sodium: 143 mmol/L (ref 134–144)
eGFR: 61 mL/min/{1.73_m2} (ref >59)

## 2023-07-31 LAB — LIPID PANEL
Chol/HDL Ratio: 2.7 {ratio} (ref 0.0–4.4)
Cholesterol, Total: 163 mg/dL (ref 100–199)
HDL: 61 mg/dL (ref >39)
LDL Chol Calc (NIH): 87 mg/dL (ref 0–99)
Triglycerides: 81 mg/dL (ref 0–149)
VLDL Cholesterol Cal: 15 mg/dL (ref 5–40)

## 2023-07-31 LAB — ALT: ALT: 11 [IU]/L (ref 0–32)

## 2023-07-31 NOTE — Telephone Encounter (Signed)
PAP: Patient assistance application for Katherine Summers has been approved by PAP Companies: [employer] from 07/31/2023 to 09/03/2024. Medication should be delivered to PAP Delivery: Home For further shipping updates, please contact [employer] ([employer]) at [phone] Pt ID is: [id number]

## 2023-07-31 NOTE — Progress Notes (Signed)
Pharmacy Medication Assistance Program Note    07/31/2023  Patient ID: Katherine Summers, female   DOB: [birth year], 77 y.o.   MRN: [id number]     07/30/2023 07/31/2023  Outreach Medication One  Initial Outreach Date (Medication One) 07/30/2023 07/30/2023  Manufacturer Medication One [employer]  [employer] Drugs Bretztri Bretztri  Type of Forensic scientist Assistance  Date Application Sent to Patient 07/30/2023 07/30/2023  Application Items Requested Application Application  Date Application Sent to Prescriber 07/30/2023 07/30/2023  Name of Prescriber Robyn Gillian Shields  Date Application Received From Patient 07/30/2023   Application Items Received From Patient Application Application  Date Application Received From Provider 07/30/2023 07/30/2023  Date Application Submitted to Manufacturer 07/30/2023 07/30/2023  Method Application Sent to Manufacturer Fax Fax  Patient Assistance Determination  Approved  Approval Start Date  07/31/2023  Approval End Date  09/03/2024  Patient Notification Method  MyChart      Catie Eppie Gibson, PharmD, BCACP, CPP Clinical Pharmacist [facility] [phone]

## 2023-08-06 ENCOUNTER — Telehealth: Payer: Self-pay | Admitting: Pharmacist

## 2023-08-06 ENCOUNTER — Other Ambulatory Visit: Payer: Medicare Other

## 2023-08-06 NOTE — Progress Notes (Unsigned)
Attempted to contact patient for scheduled appointment for medication management. Left HIPAA compliant message for patient to return my call at their convenience.   Will send MyChart.  Catie T. Harper, PharmD, BCACP, CPP Clinical Pharmacist [facility] [phone]  

## 2023-08-10 ENCOUNTER — Telehealth: Payer: Self-pay

## 2023-08-10 NOTE — Progress Notes (Unsigned)
  Care Coordination Note  08/10/2023 Name: [name] MRN: [id number] DOB: May 27, 1946  Katherine Summers is a 77 y.o. year old female who is a primary care patient of Dorothyann Peng, MD and is actively engaged with the Chronic Care Management team. I reached out to Hershal Coria by phone today to assist with re-scheduling an initial visit with the Pharmacist  Follow up plan: Unsuccessful telephone outreach attempt made. A HIPAA compliant phone message was left for the patient providing contact information and requesting a return call.   Penne Lash , RMA     [facility] Health  [facility], [facility] Guide  Direct Dial: [phone]  Website: [email or web address]

## 2023-08-13 NOTE — Progress Notes (Signed)
  Care Coordination Note  08/13/2023 Name: Katherine Summers MRN: [id number] DOB: 1946/03/26  Katherine Summers is a 77 y.o. year old female who is a primary care patient of Dorothyann Peng, MD and is actively engaged with the Chronic Care Management team. I reached out to Hershal Coria by phone today to assist with re-scheduling an initial visit with the Pharmacist  Follow up plan: Unsuccessful telephone outreach attempt made. A HIPAA compliant phone message was left for the patient providing contact information and requesting a return call.   Penne Lash , RMA     [facility]  [facility], [facility] Guide  Direct Dial: [phone]  Website: [email or web address]

## 2023-08-13 NOTE — Progress Notes (Signed)
  Care Coordination Note  08/13/2023 Name: Katherine Summers MRN: [id number] DOB: May 25, 1946  Katherine Summers is a 77 y.o. year old female who is a primary care patient of Dorothyann Peng, MD and is actively engaged with the Chronic Care Management team. I reached out to Hershal Coria by phone today to assist with re-scheduling an initial visit with the Pharmacist  Follow up plan: Telephone appointment with care management team member scheduled for:09/12/2023  Penne Lash , RMA     [facility]  [facility], [facility] Guide  Direct Dial: [phone]  Website: [email or web address]

## 2023-08-20 ENCOUNTER — Ambulatory Visit: Payer: Medicare Other | Admitting: Internal Medicine

## 2023-09-12 ENCOUNTER — Other Ambulatory Visit (INDEPENDENT_AMBULATORY_CARE_PROVIDER_SITE_OTHER): Payer: Medicare Other | Admitting: Pharmacist

## 2023-09-12 DIAGNOSIS — J449 Chronic obstructive pulmonary disease, unspecified: Secondary | ICD-10-CM

## 2023-09-12 DIAGNOSIS — I10 Essential (primary) hypertension: Secondary | ICD-10-CM

## 2023-09-12 DIAGNOSIS — E7800 Pure hypercholesterolemia, unspecified: Secondary | ICD-10-CM

## 2023-09-12 NOTE — Progress Notes (Signed)
 09/12/2023 Name: Katherine Summers MRN: [id number] DOB: Apr 04, 1946  Chief Complaint  Patient presents with   Medication Management    Katherine Summers is a 78 y.o. year old female who presented for a telephone visit.   They were referred to the pharmacist by their PCP for assistance in managing complex medication management.    Subjective:  Care Team: Primary Care Provider: Jarold Medici, MD ; Next Scheduled Visit: 11/2023  Medication Access/Adherence  Current Pharmacy:  [facility] [address], [place] - [address]  [place] [address] Phone: [phone] Fax: [phone]  [facility] - [place], [place] - [address] [place] [address] Phone: [phone] Fax: [phone]   Patient reports affordability concerns with their medications: No  Patient reports access/transportation concerns to their pharmacy: No  Patient reports adherence concerns with their medications:  No     Hypertension:  Current medications: amlodipine  5 mg daily, lisinopril /hydrochlorothiazide  20/12.5 mg daily   Patient has a validated, automated, upper arm home BP cuff Current blood pressure readings readings: none - has   Patient denies hypotensive s/sx including dizziness, lightheadedness.  Patient denies hypertensive symptoms including headache, chest pain, shortness of breath  Hyperlipidemia/ASCVD Risk Reduction  Current lipid lowering medications: atorvastatin  20 mg three days weekly - finishing home supply, she has 12 pills remaining. She has 2 bottles of rosuvastatin  10 mg daily that she has not started yet.    Clinical ASCVD: No  The 10-year ASCVD risk score (Arnett DK, et al., 2019) is: 13.2%   Values used to calculate the score:     Age: 14 years     Sex: Female     Is Non-Hispanic African American: Yes     Diabetic: No     Tobacco smoker: No     Systolic Blood Pressure: 122 mmHg     Is BP treated: Yes      HDL Cholesterol: 61 mg/dL     Total Cholesterol: 163 mg/dL     COPD:  Current medications: Breztri  2 puffs twice daily - sometimes misses evening dose, but remembering morning doses. Denies need for albuterol  recently.   Objective:  Lab Results  Component Value Date   HGBA1C 5.6 04/11/2023    Lab Results  Component Value Date   CREATININE 0.96 07/30/2023   BUN 17 07/30/2023   NA 143 07/30/2023   K 3.6 07/30/2023   CL 102 07/30/2023   CO2 24 07/30/2023    Lab Results  Component Value Date   CHOL 163 07/30/2023   HDL 61 07/30/2023   LDLCALC 87 07/30/2023   TRIG 81 07/30/2023   CHOLHDL 2.7 07/30/2023    Medications Reviewed Today     Reviewed by Rudy Dorothyann DASEN, RPH-CPP (Pharmacist) on 09/12/23 at 1354  Med List Status: <None>   Medication Order Taking? Sig Documenting Provider Last Dose Status Informant  albuterol  (VENTOLIN  HFA) 108 (90 Base) MCG/ACT inhaler [id number] No Inhale 2 puffs into the lungs every 6 (six) hours as needed for wheezing or shortness of breath.  Patient not taking: Reported on 09/12/2023   Kara Dorn NOVAK, MD Not Taking Active   amLODipine  (NORVASC ) 5 MG tablet [id number] Yes TAKE 1 TABLET(5 MG) BY MOUTH DAILY Jarold Medici, MD Taking Active   Budeson-Glycopyrrol-Formoterol (BREZTRI  AEROSPHERE) 160-9-4.8 MCG/ACT AERO [id number] Yes Inhale 2 puffs into the lungs 2 (two) times daily. Jarold Medici, MD Taking Active   escitalopram  (LEXAPRO )  10 MG tablet [id number] Yes TAKE 1 TABLET(10 MG) BY MOUTH DAILY Jarold Medici, MD Taking Active   ipratropium-albuterol  (DUONEB) 0.5-2.5 (3) MG/3ML SOLN [id number]  Take 3 mLs by nebulization every 6 (six) hours as needed (shortness of breath, cough or wheezing). Kara Dorn NOVAK, MD  Active   lisinopril -hydrochlorothiazide  (ZESTORETIC ) 20-12.5 MG tablet [id number] Yes TAKE 1 TABLET BY MOUTH DAILY Jarold Medici, MD Taking Active   rosuvastatin  (CRESTOR ) 10 MG tablet [id number]  TAKE 1 TABLET(10 MG) BY MOUTH  DAILY  Patient not taking: Reported on 07/30/2023   Jarold Medici, MD  Active   VITAMIN D PO [id number]  Take 5,000 mg by mouth. Take 1 daily mon through fri [provider]  Active   Med List Note Murray Maywood, CMA 12/07/20 [id number]):                Assessment/Plan:   Hypertension: - Currently controlled at last PCP visit.  - Reviewed long term cardiovascular and renal outcomes of uncontrolled blood pressure - Reviewed appropriate blood pressure monitoring technique and reviewed goal blood pressure. Recommended to check home blood pressure and heart rate periodically, document, and provide at future appointments.  - Recommend to continue current regimen at this time  Hyperlipidemia/ASCVD Risk Reduction: - Currently uncontrolled. Patient will complete her supply of atorvastatin  three days weekly, then start rosuvastatin  10 mg daily. She was counseled to not pick up rosuvastatin  in 2025 until she has completed all of her old supply to avoid failing medication adherence in cholesterol measure this year.   COPD: - Currently controlled.  - Recommend to continue current regimen at this time     Follow Up Plan: follow up with PCP as scheduled  Catie IVAR Centers, PharmD, BCACP, CPP Clinical Pharmacist [facility] [phone]

## 2023-12-12 ENCOUNTER — Encounter: Payer: Medicare Other | Admitting: Internal Medicine

## 2024-01-10 ENCOUNTER — Telehealth: Payer: Self-pay

## 2024-01-10 NOTE — Telephone Encounter (Signed)
 PT STATED SHE DOESNT LIKE THE IDEA OF NOT BEING ABLE TO GET IN TOUCH W DR SANDERS OR NURSE. THE PHONE SYSTEM SHE DOESNT LIKE IT AND SHE WILL BE GOING TO [facility] WHERE THE DEAL W GERIATRIC PT.

## 2024-01-11 ENCOUNTER — Other Ambulatory Visit: Payer: Self-pay | Admitting: Internal Medicine

## 2024-01-18 ENCOUNTER — Ambulatory Visit (INDEPENDENT_AMBULATORY_CARE_PROVIDER_SITE_OTHER): Admitting: Podiatry

## 2024-01-18 ENCOUNTER — Encounter: Payer: Self-pay | Admitting: Podiatry

## 2024-01-18 DIAGNOSIS — M79672 Pain in left foot: Secondary | ICD-10-CM

## 2024-01-18 DIAGNOSIS — B351 Tinea unguium: Secondary | ICD-10-CM

## 2024-01-18 DIAGNOSIS — M79671 Pain in right foot: Secondary | ICD-10-CM | POA: Diagnosis not present

## 2024-01-18 NOTE — Progress Notes (Signed)
 Patient presents for evaluation and treatment of tenderness and some redness around nails feet.  Pain with walking and wearing shoes around toes.  Physical exam:  General appearance: Alert, pleasant, and in no acute distress.  Vascular: Pedal pulses: DP palpable bilaterally, PT nonpalpable bilaterally.  On her at edema lower legs bilaterally  Neurological:  No complaint of burning or numbness.  Dermatologic:  Nails thickened, disfigured, discolored 1-5 BL with subungual debris.  Some redness along nail folds bilaterally but no signs of drainage or infection.  Musculoskeletal:  No deformities 2 through 5 bilaterally   Diagnosis: Painful onychomycotic nails 1 through 5 bilaterally. 2. Pain toes 1 through 5 bilaterally.  Plan: Debrided onychomycotic nails 1 through 5 bilaterally.  Return 3 months

## 2024-01-25 ENCOUNTER — Telehealth: Payer: Self-pay | Admitting: Pharmacist

## 2024-01-25 DIAGNOSIS — J438 Other emphysema: Secondary | ICD-10-CM

## 2024-01-25 MED ORDER — BREZTRI AEROSPHERE 160-9-4.8 MCG/ACT IN AERO: 2.0000 | INHALATION_SPRAY | Freq: Two times a day (BID) | RESPIRATORY_TRACT | 3 refills | Status: AC

## 2024-01-25 NOTE — Progress Notes (Signed)
   01/25/2024  Patient ID: Katherine Summers, female   DOB: July 20, 1946, 78 y.o.   MRN: [id number]  The office received a letter from [employer] requesting a new prescription for Breztri .  Patient was previously approved to receive Breztri  through the [employer] Patient Assistance Program.  Patient was called to complet e a medi ation review but she did not answer the phone. HIPAA compliant message was left on her voicemail.     So that there is no discontinuance of therapy, a new prescription for Breztri  will be e-scribed to [facility] with co-signature required.   Plan: Call Patient back in 2 weeks.   Geronimo Krabbe, PharmD, BCACP Clinical Pharmacist [phone]

## 2024-02-15 ENCOUNTER — Telehealth: Payer: Self-pay | Admitting: Pharmacist

## 2024-02-15 DIAGNOSIS — Z79899 Other long term (current) drug therapy: Secondary | ICD-10-CM

## 2024-02-15 NOTE — Progress Notes (Signed)
   02/15/2024  Patient ID: Katherine Summers, female   DOB: 1946-09-02, 78 y.o.   MRN: [id number]  Patient was called for medication review.  A refill was sent to [facility] on her behalf a few weeks ago for Breztri  through [employer]'s program.  Unfortunately, Patient did not answer her phone. HIPAA compliant message was left on her voicemail.  Reviewed Dr. Anson Basta for adherence:  Amlodipine  5 mg filled 01/16/24 #90 Rosuvastatin  10 mg filled 01/16/24 #90 Lisinopril -HCTZ 20/12.5 mg last filled 01/15/24 #90  Plan:  Call Patient back in 1 month   Geronimo Krabbe, PharmD, [facility] Clinical Pharmacist [phone]

## 2024-03-19 ENCOUNTER — Telehealth: Payer: Self-pay | Admitting: Pharmacist

## 2024-03-19 DIAGNOSIS — Z79899 Other long term (current) drug therapy: Secondary | ICD-10-CM

## 2024-03-19 NOTE — Progress Notes (Signed)
   03/19/2024  Patient ID: Katherine Summers, female   DOB: 01/09/1946, 78 y.o.   MRN: [id number]  Called Patient to complete a medication review. Unfortunately, she did not answer her phone. HIPAA compliant message was left on her voicemail. She appears adherent to her chronic medications per fill data. She received Breztri  through the [employer] Patient assistance program.  Plan: Will call Patient back in October for re-enrollment for 2026.   Cassius DOROTHA Brought, PharmD, BCACP Clinical Pharmacist [phone]

## 2024-04-22 ENCOUNTER — Ambulatory Visit: Admitting: Podiatry

## 2024-06-23 ENCOUNTER — Ambulatory Visit (INDEPENDENT_AMBULATORY_CARE_PROVIDER_SITE_OTHER): Admitting: Podiatry

## 2024-06-23 DIAGNOSIS — B351 Tinea unguium: Secondary | ICD-10-CM | POA: Diagnosis not present

## 2024-06-23 DIAGNOSIS — M79672 Pain in left foot: Secondary | ICD-10-CM

## 2024-06-23 DIAGNOSIS — M79671 Pain in right foot: Secondary | ICD-10-CM | POA: Diagnosis not present

## 2024-06-23 NOTE — Progress Notes (Signed)
 Patient presents for evaluation and treatment of tenderness and some redness around nails feet.  Tenderness around toes with walking and wearing shoes.  Physical exam:  General appearance: Alert, pleasant, and in no acute distress.  Vascular: Pedal pulses: DP 2/4 B/L, PT 0/4 B/L. Moderate edema lower legs bilaterally  Neu  Dermatologic:  Nails thickened, disfigured, discolored 1-5 BL with subungual debris.  Redness and hypertrophic nail folds along nail folds bilaterally but no signs of drainage or infection.  Musculoskeletal:     Diagnosis: 1. Painful onychomycotic nails 1 through 5 bilaterally. 2. Pain toes 1 through 5 bilaterally.  Plan: -Debrided onychomycotic nails 1 through 5 bilaterally.  Sharply debrided nails with nail clipper and reduced with a power bur.  Return 3 months RFC

## 2024-06-24 ENCOUNTER — Telehealth: Payer: Self-pay | Admitting: Pharmacist

## 2024-06-24 NOTE — Progress Notes (Signed)
   06/24/2024  Patient ID: Katherine Summers, female   DOB: Jun 07, 1946, 78 y.o.   MRN: [id number]  Patient was called regarding patient assistance renewal for 2026. She does not have the [employer] green banner on her chart and she does not have a provider listed as her PCP.    Plan: Will not proceed with 2026 renewal until hear from the Patient to determine where she is receiving care.  Cassius DOROTHA Brought, PharmD, BCACP Clinical Pharmacist [phone]

## 2024-10-01 ENCOUNTER — Ambulatory Visit: Admitting: Podiatry

## 2025-01-07 ENCOUNTER — Ambulatory Visit: Admitting: Podiatry
